# Patient Record
Sex: Female | Born: 1935
Health system: Southern US, Community
[De-identification: ages and names within clinical notes are randomized; demographics above are authoritative.]

## PROBLEM LIST (undated history)

## (undated) DIAGNOSIS — N289 Disorder of kidney and ureter, unspecified: Secondary | ICD-10-CM

## (undated) DIAGNOSIS — T8859XA Other complications of anesthesia, initial encounter: Secondary | ICD-10-CM

## (undated) DIAGNOSIS — C169 Malignant neoplasm of stomach, unspecified: Secondary | ICD-10-CM

## (undated) DIAGNOSIS — G893 Neoplasm related pain (acute) (chronic): Secondary | ICD-10-CM

## (undated) DIAGNOSIS — I82409 Acute embolism and thrombosis of unspecified deep veins of unspecified lower extremity: Secondary | ICD-10-CM

## (undated) DIAGNOSIS — K219 Gastro-esophageal reflux disease without esophagitis: Secondary | ICD-10-CM

## (undated) DIAGNOSIS — T4145XA Adverse effect of unspecified anesthetic, initial encounter: Secondary | ICD-10-CM

## (undated) DIAGNOSIS — I10 Essential (primary) hypertension: Secondary | ICD-10-CM

## (undated) DIAGNOSIS — C189 Malignant neoplasm of colon, unspecified: Secondary | ICD-10-CM

## (undated) DIAGNOSIS — I509 Heart failure, unspecified: Secondary | ICD-10-CM

## (undated) DIAGNOSIS — I2510 Atherosclerotic heart disease of native coronary artery without angina pectoris: Secondary | ICD-10-CM

## (undated) DIAGNOSIS — I2699 Other pulmonary embolism without acute cor pulmonale: Secondary | ICD-10-CM

## (undated) HISTORY — PX: ABDOMINAL HYSTERECTOMY: SHX81

## (undated) HISTORY — PX: COLON SURGERY: SHX602

## (undated) HISTORY — DX: Neoplasm related pain (acute) (chronic): G89.3

## (undated) HISTORY — PX: IVC FILTER PLACEMENT (ARMC HX): HXRAD1551

## (undated) HISTORY — DX: Malignant neoplasm of colon, unspecified: C18.9

---

## 1998-06-28 HISTORY — PX: JOINT REPLACEMENT: SHX530

## 2015-05-16 ENCOUNTER — Other Ambulatory Visit: Payer: Self-pay

## 2015-05-16 ENCOUNTER — Emergency Department: Payer: Medicaid Other

## 2015-05-16 ENCOUNTER — Encounter: Payer: Self-pay | Admitting: *Deleted

## 2015-05-16 ENCOUNTER — Emergency Department
Admission: EM | Admit: 2015-05-16 | Discharge: 2015-05-16 | Disposition: A | Discharge status: Home / Self Care | Payer: Medicaid Other | Attending: Emergency Medicine | Admitting: Emergency Medicine

## 2015-05-16 DIAGNOSIS — Z7982 Long term (current) use of aspirin: Secondary | ICD-10-CM | POA: Insufficient documentation

## 2015-05-16 DIAGNOSIS — C189 Malignant neoplasm of colon, unspecified: Secondary | ICD-10-CM | POA: Insufficient documentation

## 2015-05-16 DIAGNOSIS — Z79899 Other long term (current) drug therapy: Secondary | ICD-10-CM | POA: Insufficient documentation

## 2015-05-16 DIAGNOSIS — I10 Essential (primary) hypertension: Secondary | ICD-10-CM | POA: Diagnosis not present

## 2015-05-16 DIAGNOSIS — G8929 Other chronic pain: Secondary | ICD-10-CM | POA: Insufficient documentation

## 2015-05-16 DIAGNOSIS — R1033 Periumbilical pain: Secondary | ICD-10-CM | POA: Diagnosis not present

## 2015-05-16 DIAGNOSIS — R109 Unspecified abdominal pain: Secondary | ICD-10-CM | POA: Diagnosis not present

## 2015-05-16 HISTORY — DX: Acute embolism and thrombosis of unspecified deep veins of unspecified lower extremity: I82.409

## 2015-05-16 HISTORY — DX: Malignant neoplasm of stomach, unspecified: C16.9

## 2015-05-16 HISTORY — DX: Atherosclerotic heart disease of native coronary artery without angina pectoris: I25.10

## 2015-05-16 HISTORY — DX: Disorder of kidney and ureter, unspecified: N28.9

## 2015-05-16 HISTORY — DX: Gastro-esophageal reflux disease without esophagitis: K21.9

## 2015-05-16 HISTORY — DX: Essential (primary) hypertension: I10

## 2015-05-16 HISTORY — DX: Other pulmonary embolism without acute cor pulmonale: I26.99

## 2015-05-16 LAB — URINALYSIS COMPLETE WITH MICROSCOPIC (ARMC ONLY)
Bilirubin Urine: NEGATIVE
GLUCOSE, UA: NEGATIVE mg/dL
Hgb urine dipstick: NEGATIVE
Ketones, ur: NEGATIVE mg/dL
Leukocytes, UA: NEGATIVE
NITRITE: NEGATIVE
Protein, ur: NEGATIVE mg/dL
Specific Gravity, Urine: 1.006 (ref 1.005–1.030)
pH: 6 (ref 5.0–8.0)

## 2015-05-16 LAB — CBC WITH DIFFERENTIAL/PLATELET
BASOS PCT: 1 %
Basophils Absolute: 0.1 10*3/uL (ref 0–0.1)
EOS ABS: 0.1 10*3/uL (ref 0–0.7)
Eosinophils Relative: 1 %
HCT: 32 % — ABNORMAL LOW (ref 35.0–47.0)
Hemoglobin: 10.6 g/dL — ABNORMAL LOW (ref 12.0–16.0)
Lymphocytes Relative: 32 %
Lymphs Abs: 2.4 10*3/uL (ref 1.0–3.6)
MCH: 29.5 pg (ref 26.0–34.0)
MCHC: 33.2 g/dL (ref 32.0–36.0)
MCV: 88.8 fL (ref 80.0–100.0)
MONO ABS: 0.6 10*3/uL (ref 0.2–0.9)
MONOS PCT: 8 %
Neutro Abs: 4.3 10*3/uL (ref 1.4–6.5)
Neutrophils Relative %: 58 %
Platelets: 242 10*3/uL (ref 150–440)
RBC: 3.61 MIL/uL — ABNORMAL LOW (ref 3.80–5.20)
RDW: 16.9 % — AB (ref 11.5–14.5)
WBC: 7.5 10*3/uL (ref 3.6–11.0)

## 2015-05-16 LAB — LIPASE, BLOOD: Lipase: 51 U/L (ref 11–51)

## 2015-05-16 LAB — LACTIC ACID, PLASMA: LACTIC ACID, VENOUS: 1.3 mmol/L (ref 0.5–2.0)

## 2015-05-16 LAB — COMPREHENSIVE METABOLIC PANEL
ALBUMIN: 3.3 g/dL — AB (ref 3.5–5.0)
ALT: 7 U/L — ABNORMAL LOW (ref 14–54)
ANION GAP: 5 (ref 5–15)
AST: 14 U/L — ABNORMAL LOW (ref 15–41)
Alkaline Phosphatase: 61 U/L (ref 38–126)
BILIRUBIN TOTAL: 0.8 mg/dL (ref 0.3–1.2)
BUN: 21 mg/dL — ABNORMAL HIGH (ref 6–20)
CO2: 27 mmol/L (ref 22–32)
Calcium: 9 mg/dL (ref 8.9–10.3)
Chloride: 101 mmol/L (ref 101–111)
Creatinine, Ser: 1.01 mg/dL — ABNORMAL HIGH (ref 0.44–1.00)
GFR calc non Af Amer: 50 mL/min — ABNORMAL LOW (ref >60)
GFR, EST AFRICAN AMERICAN: 58 mL/min — AB (ref >60)
GLUCOSE: 93 mg/dL (ref 65–99)
POTASSIUM: 4.4 mmol/L (ref 3.5–5.1)
SODIUM: 133 mmol/L — AB (ref 135–145)
TOTAL PROTEIN: 6.7 g/dL (ref 6.5–8.1)

## 2015-05-16 MED ORDER — FENTANYL CITRATE (PF) 100 MCG/2ML IJ SOLN
50.0000 ug | Freq: Once | INTRAMUSCULAR | Status: AC
  Administered 2015-05-16: 50 ug via INTRAVENOUS
  Filled 2015-05-16: qty 2

## 2015-05-16 MED ORDER — OXYCODONE-ACETAMINOPHEN 5-325 MG PO TABS: 1.0000 | ORAL_TABLET | Freq: Four times a day (QID) | ORAL | Status: DC | PRN

## 2015-05-16 MED ORDER — IOHEXOL 240 MG/ML SOLN
25.0000 mL | Freq: Once | INTRAMUSCULAR | Status: AC | PRN
  Administered 2015-05-16: 25 mL via ORAL

## 2015-05-16 MED ORDER — IOHEXOL 300 MG/ML  SOLN
75.0000 mL | Freq: Once | INTRAMUSCULAR | Status: AC | PRN
  Administered 2015-05-16: 75 mL via INTRAVENOUS

## 2015-05-16 NOTE — Discharge Instructions (Signed)
Abdominal Pain, Adult It was a pleasure to meet you. It does appear that there is still is likely cancer in your abdomen and this can be causing some of your pain. If you have increased pain, vomiting, fever, or you feel worse in any way including lightheadedness or dehydration return to the emergency room. We are happy to take care of you. Take the pain medication as needed. Do not take tramadol and Percocet at the same time. Continue taking MiraLAX or something to encourage her bowels to move while you take the pain medication as they can constipate you. Do follow-up with the meeting you have already scheduled for changing her Medicare to [facility] and follow-up closely as an outpatient in the clinic and doctor that we have advised to.   Many things can cause abdominal pain. Usually, abdominal pain is not caused by a disease and will improve without treatment. It can often be observed and treated at home. Your health care provider will do a physical exam and possibly order blood tests and X-rays to help determine the seriousness of your pain. However, in many cases, more time must pass before a clear cause of the pain can be found. Before that point, your health care provider may not know if you need more testing or further treatment. HOME CARE INSTRUCTIONS Monitor your abdominal pain for any changes. The following actions may help to alleviate any discomfort you are experiencing:  Only take over-the-counter or prescription medicines as directed by your health care provider.  Do not take laxatives unless directed to do so by your health care provider.  Try a clear liquid diet (broth, tea, or water) as directed by your health care provider. Slowly move to a bland diet as tolerated. SEEK MEDICAL CARE IF:  You have unexplained abdominal pain.  You have abdominal pain associated with nausea or diarrhea.  You have pain when you urinate or have a bowel movement.  You experience abdominal pain  that wakes you in the night.  You have abdominal pain that is worsened or improved by eating food.  You have abdominal pain that is worsened with eating fatty foods.  You have a fever. SEEK IMMEDIATE MEDICAL CARE IF:  Your pain does not go away within 2 hours.  You keep throwing up (vomiting).  Your pain is felt only in portions of the abdomen, such as the right side or the left lower portion of the abdomen.  You pass bloody or black tarry stools. MAKE SURE YOU:  Understand these instructions.  Will watch your condition.  Will get help right away if you are not doing well or get worse.   This information is not intended to replace advice given to you by your health care provider. Make sure you discuss any questions you have with your health care provider.   Document Released: 03/24/2005 Document Revised: 03/05/2015 Document Reviewed: 02/21/2013 Elsevier Interactive Patient Education [employer].

## 2015-05-16 NOTE — ED Provider Notes (Addendum)
[facility] Emergency Department Provider Note  ____________________________________________   I have reviewed the triage vital signs and the nursing notes.   HISTORY  Chief Complaint Abdominal Pain and Colon Cancer    HPI Jill Bowers is a 81 y.o. female unfortunate 81 year old woman presents today complaining of chronic abdominal pain which is been there for several months. Patient just moved here from [place]. She had a very, complicated and extensive recent medical history there. The daughter moved her here to keep her closerto her. She fortunately does bring a history and physical with her from her recent hospitalizations. Patient is DNR/DNI. According to notes, patient has a history of surgical resection of colon cancer July 2016 with a very, complicated course. Apparently, according to her daughter, she had a reaction to the anesthesia was "in a coma" for a week. Afterwards, she develope a right lower showed a DVT bilateral pulmonary emboli. Which is anticoagulated, she had GI bleeds requiring transfusion. She had an IVC filter placed 02/05/2015. She has chronic A. fib but is not on anticoagulants due to the GI bleeding. She presented again to different hospital, [facility] in 02/12/2015 complaining of shortness of breath she had an acute kidney injury at that time and she was metabolically acidotic secondary to dehydration and antihypertensive use it was thought. They DC'd her ACE inhibitor and or ARB, they gave her IV fluid her creatinine was down to 1.6 at discharge. Patient has a history of anemia and her hemoglobin was 7.8. Patient was given a prescription for tramadol for her chronic abdominal pain which is been present since her surgery, and she has been taking it but she has breakthrough pain and umbilical pain and this is why the daughter brought her in. She is eating and drinking but not as much as she used to. No fevers no chills no vomiting.  She has had no melena or bright red blood per rectum or hematemesis. She takes an aspirin but is not otherwise anticoagulated. Last known hemoglobin was 7.8.  Past Medical History  Diagnosis Date  . Cancer ([place])   . Coronary artery disease   . Hypertension   . Blood clot associated with vein wall inflammation   . Anemia   . DVT (deep venous thrombosis) ([place])   . PE (pulmonary embolism)   . Hyperkalemia   . GERD (gastroesophageal reflux disease)   . Vitamin D deficiency   . Renal disorder   . Gastrointestinal hemorrhage   . Malignant neoplasm of stomach (HCC)     There are no active problems to display for this patient.   Past Surgical History  Procedure Laterality Date  . Ivc filter placement ([facility] hx)    . Colon surgery    . Abdominal hysterectomy    . Joint replacement Left     knee    Current Outpatient Rx  Name  Route  Sig  Dispense  Refill  . acetaminophen (TYLENOL) 650 MG CR tablet   Oral   Take 650 mg by mouth 3 (three) times daily with meals.         Marland Kitchen aspirin 81 MG chewable tablet   Oral   Chew 81 mg by mouth daily.         . carvedilol (COREG) 6.25 MG tablet   Oral   Take 6.25 mg by mouth 2 (two) times daily with a meal.         . Cholecalciferol (VITAMIN D-3) 1000 UNITS CAPS   Oral  Take 2 capsules by mouth daily.         . furosemide (LASIX) 40 MG tablet   Oral   Take 40 mg by mouth 2 (two) times daily.         . Magnesium Oxide 500 MG TABS   Oral   Take 1 tablet by mouth 2 (two) times daily.         Marland Kitchen omeprazole (PRILOSEC) 20 MG capsule   Oral   Take 20 mg by mouth daily.         Marland Kitchen spironolactone (ALDACTONE) 25 MG tablet   Oral   Take 25 mg by mouth 2 (two) times daily.         . temazepam (RESTORIL) 15 MG capsule   Oral   Take 15 mg by mouth at bedtime as needed for sleep.         . traMADol (ULTRAM) 50 MG tablet   Oral   Take by mouth every 6 (six) hours as needed.         . vitamin C (ASCORBIC ACID) 500 MG  tablet   Oral   Take 500 mg by mouth daily.           Allergies Nsaids; Reglan; Statins; and Torsemide  No family history on file.  Social History Social History  Substance Use Topics  . Smoking status: Never Smoker   . Smokeless tobacco: None  . Alcohol Use: No    Review of Systems Constitutional: No fever/chills Eyes: No visual changes. ENT: No sore throat. No stiff neck no neck pain Cardiovascular: Denies chest pain. Respiratory: Denies shortness of breath. Gastrointestinal:   no vomiting.  No diarrhea.  No constipation. Genitourinary: Negative for dysuria. Musculoskeletal: Negative lower extremity swelling Skin: Negative for rash. Neurological: Negative for headaches, focal weakness or numbness. 10-point ROS otherwise negative.  ____________________________________________   PHYSICAL EXAM:  VITAL SIGNS: ED Triage Vitals  Enc Vitals Group     BP 05/16/15 0946 96/41 mmHg     Pulse Rate 05/16/15 0946 56     Resp 05/16/15 0946 20     Temp 05/16/15 0946 98.2 F (36.8 C)     Temp Source 05/16/15 0946 Oral     SpO2 05/16/15 0946 99 %     Weight 05/16/15 0946 170 lb (77.111 kg)     Height 05/16/15 0946 5\' 3"  (1.6 m)     Head Cir --      Peak Flow --      Pain Score 05/16/15 0948 8     Pain Loc --      Pain Edu? --      Excl. in [place]? --     Constitutional: Alert and oriented. Well appearing and in no acute distress. Eyes: Conjunctivae are normal. PERRL. EOMI. Head: Atraumatic. Nose: No congestion/rhinnorhea. Mouth/Throat: Mucous membranes are moist.  Oropharynx non-erythematous. Neck: No stridor.   Nontender with no meningismus Cardiovascular: Normal rate, regular rhythm. Grossly normal heart sounds.  Good peripheral circulation. Respiratory: Normal respiratory effort.  No retractions. Lungs CTAB. Abdominal: Soft and late tender especially in the periumbilical region. No distention. No guarding no rebound Back:  There is no focal tenderness or step off  there is no midline tenderness there are no lesions noted. there is no CVA tenderness Musculoskeletal: No lower extremity tenderness. No joint effusions, no DVT signs strong distal pulses no edema Neurologic:  Normal speech and language. No gross focal neurologic deficits are appreciated.  Skin:  Skin is  warm, dry and intact. No rash noted. Psychiatric: Mood and affect are normal. Speech and behavior are normal.  ____________________________________________   LABS (all labs ordered are listed, but only abnormal results are displayed)  Labs Reviewed  CBC WITH DIFFERENTIAL/PLATELET - Abnormal; Notable for the following:    RBC 3.61 (*)    Hemoglobin 10.6 (*)    HCT 32.0 (*)    RDW 16.9 (*)    All other components within normal limits  COMPREHENSIVE METABOLIC PANEL - Abnormal; Notable for the following:    Sodium 133 (*)    BUN 21 (*)    Creatinine, Ser 1.01 (*)    Albumin 3.3 (*)    AST 14 (*)    ALT 7 (*)    GFR calc non Af Amer 50 (*)    GFR calc Af Amer 58 (*)    All other components within normal limits  URINALYSIS COMPLETEWITH MICROSCOPIC ([facility] ONLY) - Abnormal; Notable for the following:    Color, Urine YELLOW (*)    APPearance CLEAR (*)    Bacteria, UA RARE (*)    Squamous Epithelial / LPF 0-5 (*)    All other components within normal limits  LIPASE, BLOOD  LACTIC ACID, PLASMA  LACTIC ACID, PLASMA   ____________________________________________  EKG  I personally interpreted any EKGs ordered by me or triage Sinus bradycardia rate 59 bpm normal axis, no acute ST elevation, flipped T's noted laterally, no old for comparison. ____________________________________________  [id number]  I reviewed any imaging ordered by me or triage that were performed during my shift ____________________________________________   PROCEDURES  Procedure(s) performed: None  Critical Care performed: None  ____________________________________________   INITIAL IMPRESSION /  ASSESSMENT AND PLAN / ED COURSE  Pertinent labs & imaging results that were available during my care of the patient were reviewed by me and considered in my medical decision making (see chart for details).  Unfortunately woman with a very, complicated recent past medical history presents today with persistent abdominal pain which has been there for several months and worse over the last several weeks. She is DNR/DNI and likely would not survive another surgery. Given her age and comorbidities, patient certainly is not a candidate for another surgery as the last surgery take her for months to recover from. The family would not want that anyway. The patient is here for pain control chronic abdominal pain and also apparently has moved here with no intact plan for any outpatient follow-up. I did do an extensive workup on her here, there is no evidence of UTI there is no evidence of ongoing GI bleed, her hemoglobin is much better than it was at discharge, her white count is normal her electrolytes are reassuring her creatinine is reassuring she does appear perhaps mildly dehydrated may have given her some IV fluid. There is no evidence of ischemic gut and her lactic acid is normal. I do not think this daily chronic pain since her surgery represents preferred cardiac pain nor do I feel this represents pulmonary or intrathoracic pathology. There is no evidence of obstruction. CT scan does not show any acute pathology requiring intervention today, but there is some suggestion of possible metastatic disease. Patient will need outpatient follow-up.   ----------------------------------------- 1:52 PM on 05/16/2015 -----------------------------------------  He did have social work talk to the family they will get outpatient follow-up. Patient is asymmetric with her heart rate. She is asymmetric with her blood pressure. She and family state that she runs about this level. Given  recent extensive, complicated  hospitalizations and would prefer not to be hospitalized and at this time I see no compelling reason to do so however they do need outpatient follow-up. Social workers have them establish this. They need to change her Medicaid to the state they will do so. I have made them very well aware that this hospital's up and limited anytime if they need to come back and they are certainly welcome to the emergency room for any new or worsening symptoms. They are requesting narcotic pain medication to discharge and the tramadol I will give that to them given her cancer status. I have advised him not to take the tramadol with Percocet, and I have also advised them to continue taking the MiraLAX that she already does. The reason of their visit here was to have outpatient follow-up and increased pain control. The patient has no complaints at this time and is eager to go home, I think this is not unreasonable given her history and again they can come back and anytime ____________________________________________   FINAL CLINICAL IMPRESSION(S) / ED DIAGNOSES  Final diagnoses:  None     Schuyler Amor, MD 05/16/15 [place], MD 05/16/15 [id number]

## 2015-05-16 NOTE — ED Notes (Signed)
Pt has Stage 3 colon cancer, daughter recently moved pt to [place], pt reports lower abdominal pain

## 2015-05-19 ENCOUNTER — Encounter: Payer: Self-pay | Admitting: *Deleted

## 2015-05-19 ENCOUNTER — Inpatient Hospital Stay: Payer: Medicaid Other | Attending: Internal Medicine | Admitting: Internal Medicine

## 2015-05-19 ENCOUNTER — Telehealth: Payer: Self-pay | Admitting: *Deleted

## 2015-05-19 VITALS — BP 121/64 | HR 60 | Temp 98.6°F | Wt 165.1 lb

## 2015-05-19 DIAGNOSIS — C189 Malignant neoplasm of colon, unspecified: Secondary | ICD-10-CM | POA: Diagnosis not present

## 2015-05-19 DIAGNOSIS — R1030 Lower abdominal pain, unspecified: Secondary | ICD-10-CM | POA: Insufficient documentation

## 2015-05-19 DIAGNOSIS — R42 Dizziness and giddiness: Secondary | ICD-10-CM | POA: Diagnosis not present

## 2015-05-19 DIAGNOSIS — I10 Essential (primary) hypertension: Secondary | ICD-10-CM

## 2015-05-19 DIAGNOSIS — Z79899 Other long term (current) drug therapy: Secondary | ICD-10-CM | POA: Diagnosis not present

## 2015-05-19 DIAGNOSIS — D649 Anemia, unspecified: Secondary | ICD-10-CM | POA: Insufficient documentation

## 2015-05-19 DIAGNOSIS — Z86711 Personal history of pulmonary embolism: Secondary | ICD-10-CM

## 2015-05-19 DIAGNOSIS — K5730 Diverticulosis of large intestine without perforation or abscess without bleeding: Secondary | ICD-10-CM | POA: Insufficient documentation

## 2015-05-19 DIAGNOSIS — K7689 Other specified diseases of liver: Secondary | ICD-10-CM | POA: Diagnosis not present

## 2015-05-19 DIAGNOSIS — Z8719 Personal history of other diseases of the digestive system: Secondary | ICD-10-CM | POA: Diagnosis not present

## 2015-05-19 DIAGNOSIS — Z7982 Long term (current) use of aspirin: Secondary | ICD-10-CM | POA: Insufficient documentation

## 2015-05-19 DIAGNOSIS — I700 Atherosclerosis of aorta: Secondary | ICD-10-CM | POA: Diagnosis not present

## 2015-05-19 DIAGNOSIS — G8929 Other chronic pain: Secondary | ICD-10-CM | POA: Diagnosis not present

## 2015-05-19 DIAGNOSIS — Z86718 Personal history of other venous thrombosis and embolism: Secondary | ICD-10-CM | POA: Insufficient documentation

## 2015-05-19 DIAGNOSIS — K219 Gastro-esophageal reflux disease without esophagitis: Secondary | ICD-10-CM | POA: Insufficient documentation

## 2015-05-19 DIAGNOSIS — E559 Vitamin D deficiency, unspecified: Secondary | ICD-10-CM | POA: Insufficient documentation

## 2015-05-19 MED ORDER — MECLIZINE HCL 32 MG PO TABS: 32.0000 mg | ORAL_TABLET | Freq: Three times a day (TID) | ORAL | Status: DC | PRN

## 2015-05-19 MED ORDER — METRONIDAZOLE 500 MG PO TABS: 500.0000 mg | ORAL_TABLET | Freq: Three times a day (TID) | ORAL | Status: DC

## 2015-05-19 MED ORDER — OXYCODONE-ACETAMINOPHEN 5-325 MG PO TABS: 1.0000 | ORAL_TABLET | Freq: Two times a day (BID) | ORAL | Status: DC | PRN

## 2015-05-19 MED ORDER — CIPROFLOXACIN HCL 500 MG PO TABS: 500.0000 mg | ORAL_TABLET | Freq: Two times a day (BID) | ORAL | Status: DC

## 2015-05-19 MED ORDER — MECLIZINE HCL 25 MG PO TABS: 25.0000 mg | ORAL_TABLET | Freq: Three times a day (TID) | ORAL | Status: DC | PRN

## 2015-05-19 NOTE — Progress Notes (Signed)
Patient here today as new evaluation to Dr. Rogue Bussing.  Following up from ED visit this past Friday for abdominal pain and malignant neoplasm of colon (unspecified part of colon). Offers no complaints at this time.  Has pain earlier today but took Percocet.  Patient is accompanied by daughter for this visit and she states patient has a lot of abdominal pain as well as headaches.

## 2015-05-19 NOTE — Progress Notes (Signed)
[facility] CONSULT NOTE  Patient Care Team: No Pcp Per Patient as PCP - General (General Practice)  CHIEF COMPLAINTS/PURPOSE OF CONSULTATION:   # July 2016- COLON CANCER [s/p surgery; [place]]; STAGE III [as per family];NOv 2016- CT A/P [[facility]]- subcentimeter hepatic cysts/question metastatic disease; irregular soft tissue density in the peritoneal fat within pelvis ? Scarring versus recurrence. ? Diverticulitis/diverticulosis.  # July R LE DVT/Bil PE s/p IVC filter;  Hx of GIB; AKI; Hx of Severe Anemia  HISTORY OF PRESENTING ILLNESS:  Jill Bowers 81 y.o.  female with a history of colon cancer diagnosed in July 2016; and as per the family/records available- patient had a complicated course with DVT PE; while on anticoagulations that up in the GI bleed. Patient status post IVC filter. However in August 2016 she developed acute renal injury/question related to ACE inhibitor versus others. Anemia hemoglobin of 10.6; most recent in sup 2016.  I also reviewed the records available from the emergency room dated 05/16/2015; summarized as above. Patient is a fair historian/ also the history is taken talking to the patient's daughter.  Patient complains of abdominal pain since her surgery; worsens in the last few weeks; to a point that she is woken up with pain. Denies any constipation or diarrhea. No fever no chills. Mild-to-moderate fatigue. No blood in stools black stools.  CT scan done in the emergency room/summarized above- question diverticulitis.  ROS: A complete 10 point review of system is done which is negative except mentioned above in history of present illness  MEDICAL HISTORY:  Past Medical History  Diagnosis Date  . Colon cancer ([place])   . Coronary artery disease   . Hypertension   . Blood clot associated with vein wall inflammation   . Anemia   . DVT (deep venous thrombosis) ([place])   . PE (pulmonary embolism)   . Hyperkalemia   . GERD (gastroesophageal reflux disease)    . Vitamin D deficiency   . Renal disorder   . Gastrointestinal hemorrhage   . Malignant neoplasm of stomach ([place])     SURGICAL HISTORY: Past Surgical History  Procedure Laterality Date  . Ivc filter placement ([facility] hx)    . Colon surgery    . Abdominal hysterectomy    . Joint replacement Left     knee    SOCIAL HISTORY: Patient used to live alone in [place] prior to surgery in July 2016. She has been through multiple hospitalizations/rehabilitation. Currently moved to [place] to the daughter. Social History   Social History  . Marital Status: Widowed    Spouse Name: N/A  . Number of Children: N/A  . Years of Education: N/A   Occupational History  . Not on file.   Social History Main Topics  . Smoking status: Never Smoker   . Smokeless tobacco: Not on file  . Alcohol Use: No  . Drug Use: Not on file  . Sexual Activity: Not on file   Other Topics Concern  . Not on file   Social History Narrative    FAMILY HISTORY:  sister- colon cancer; son had colon cancer.   ALLERGIES:  is allergic to nsaids; reglan; statins; and torsemide.  MEDICATIONS:  Current Outpatient Prescriptions  Medication Sig Dispense Refill  . acetaminophen (TYLENOL) 650 MG CR tablet Take 650 mg by mouth 3 (three) times daily with meals.    Marland Kitchen aspirin 81 MG chewable tablet Chew 81 mg by mouth daily.    . carvedilol (COREG) 6.25 MG  tablet Take 6.25 mg by mouth 2 (two) times daily with a meal.    . Cholecalciferol (VITAMIN D-3) 1000 UNITS CAPS Take 2 capsules by mouth daily.    . furosemide (LASIX) 40 MG tablet Take 40 mg by mouth 2 (two) times daily.    . Magnesium Oxide 500 MG TABS Take 1 tablet by mouth 2 (two) times daily.    Marland Kitchen omeprazole (PRILOSEC) 20 MG capsule Take 20 mg by mouth daily.    Marland Kitchen oxyCODONE-acetaminophen (ROXICET) 5-325 MG tablet Take 1 tablet by mouth every 6 (six) hours as needed. 20 tablet 0  . spironolactone (ALDACTONE) 25 MG tablet Take 25 mg by mouth 2 (two)  times daily.    . temazepam (RESTORIL) 15 MG capsule Take 15 mg by mouth at bedtime as needed for sleep.    . traMADol (ULTRAM) 50 MG tablet Take by mouth every 6 (six) hours as needed.    . vitamin C (ASCORBIC ACID) 500 MG tablet Take 500 mg by mouth daily.     No current facility-administered medications for this visit.      Marland Kitchen  PHYSICAL EXAMINATION: ECOG PERFORMANCE STATUS: 3 - Symptomatic, >50% confined to bed  Filed Vitals:   05/19/15 1531  BP: 121/64  Pulse: 60  Temp: 98.6 F (37 C)   Filed Weights   05/19/15 1531  Weight: 165 lb 2 oz (74.9 kg)    GENERAL: Well-nourished well-developed; Alert, no distress and comfortable.  In wheel chair.  EYES: no pallor or icterus OROPHARYNX: no thrush or ulceration; good dentition  NECK: supple, no masses felt LYMPH:  no palpable lymphadenopathy in the cervical, axillary or inguinal regions LUNGS: clear to auscultation and  No wheeze or crackles HEART/CVS: regular rate & rhythm and no murmurs; No lower extremity edema ABDOMEN: abdomen soft, non-tender and normal bowel sounds Musculoskeletal:no cyanosis of digits and no clubbing  PSYCH: alert & oriented x 3 with fluent speech NEURO: no focal motor/sensory deficits SKIN:  no rashes or significant lesions  LABORATORY DATA:  I have reviewed the data as listed Lab Results  Component Value Date   WBC 7.5 05/16/2015   HGB 10.6* 05/16/2015   HCT 32.0* 05/16/2015   MCV 88.8 05/16/2015   PLT 242 05/16/2015    Recent Labs  05/16/15 1039  NA 133*  K 4.4  CL 101  CO2 27  GLUCOSE 93  BUN 21*  CREATININE 1.01*  CALCIUM 9.0  GFRNONAA 50*  GFRAA 58*  PROT 6.7  ALBUMIN 3.3*  AST 14*  ALT 7*  ALKPHOS 61  BILITOT 0.8    RADIOGRAPHIC STUDIES: I have personally reviewed the radiological images as listed and agreed with the findings in the report. Ct Abdomen Pelvis W Contrast  05/16/2015  CLINICAL DATA:  Lower abdominal pain, stage III colon cancer. EXAM: CT ABDOMEN AND  PELVIS WITH CONTRAST TECHNIQUE: Multidetector CT imaging of the abdomen and pelvis was performed using the standard protocol following bolus administration of intravenous contrast. CONTRAST:  21mL OMNIPAQUE IOHEXOL 300 MG/ML  SOLN COMPARISON:  None. FINDINGS: Probable scarring or subsegmental atelectasis is noted posteriorly in right lung base. No significant osseous abnormality is noted. No gallstones are noted. Multiple cysts of varying sizes are seen seen in hepatic parenchyma. Multiple other smaller low densities are noted that are too small to characterize, and metastatic disease cannot be excluded given the history of colon cancer. The spleen appears normal. Pancreatic ductal dilatation is noted measuring 5 mm in the tail. Adrenal glands  appear normal. No hydronephrosis or renal obstruction is noted. No renal or ureteral calculi are noted. Atherosclerosis of abdominal aorta is noted without aneurysm formation. IVC filter is noted in infrarenal position. There is no evidence of bowel obstruction. Extensive diverticulosis of sigmoid colon is noted, with probable diverticulitis seen proximally. Uterine calcifications are noted most consistent with fibroids. Urinary bladder is unremarkable. No significant adenopathy is noted. Irregular density is seen in the peritoneal fat anteriorly in the pelvis ; carcinomatosis cannot be excluded. IMPRESSION: Multiple cysts are noted throughout hepatic parenchyma, with multiple other smaller densities which are too small to characterize ; given the history of colon carcinoma, MRI of the liver is recommended on nonemergent basis to evaluate for metastatic disease. Pancreatic duct dilatation is measured at 5 mm in the tail ; no definite pancreatic mass is noted, but clinical correlation is recommended to rule out pancreatic ductal obstruction. Atherosclerosis of abdominal aorta is noted without aneurysm formation. Extensive diverticulosis of sigmoid colon is noted, with probable  acute diverticulitis seen proximally within the sigmoid colon. Irregular soft tissue densities are seen in the peritoneal fat anteriorly within the pelvis which may simply represent scarring, but peritoneal carcinomatosis cannot be excluded. Electronically Signed   By: Marijo Conception, M.D.   On: 05/16/2015 13:10    ASSESSMENT & PLAN:   # Performance status 3. Stage III colon cancer [as per family]- we will try to obtain the records from previous office. I discussed that in general we would recommend adjuvant chemotherapy for stage III colon cancer. However given her age/multiple medical issues this is prohibitive; and the risks of chemotherapy over weigh the benefits. In terms of prognosis/risk of recurrence- would be discussed once I'm able to review the her pathology report.  # Abdominal pain-chronic question related to his surgery- recommend continue Percocet. However given the recent worsening- I would recommend a trial of antibiotics for possible diverticulitis.  # Multiple hepatic hypodensities-cyst versus other etiology- I would not recommend any biopsy at this time. I think given her borderline performance status- this would not change the current management.  # Vertigo- recommend meclizine.  # We'll see her back in approximately 2 weeks or so; to evaluate her abdominal pain/get CBC CMP CEA at that time.   All questions were answered. The patient knows to call the clinic with any problems, questions or concerns.     Cammie Sickle, MD 05/19/2015 3:59 PM

## 2015-05-19 NOTE — Telephone Encounter (Signed)
Meclizine not available in 32 mg can they change to 25 mg? Per PO Dr Rogue Bussing ok to change to 25 mg tid prn

## 2015-05-21 ENCOUNTER — Telehealth: Payer: Self-pay | Admitting: *Deleted

## 2015-05-21 NOTE — Telephone Encounter (Signed)
Left message that pt was here earlier in the week and had asked for refill and rx was never given to pt. She can come by anytime today and pick it up.  Asked to please call and verify they are coming. And that we do apologize for not giving it to them the day they were here.

## 2015-05-27 ENCOUNTER — Telehealth: Payer: Self-pay | Admitting: *Deleted

## 2015-05-27 NOTE — Telephone Encounter (Signed)
md agrees

## 2015-05-27 NOTE — Telephone Encounter (Signed)
When asked how she is giving her pain med, she reports that she only gives her 1 pill once a day and sometimes twice a day. I asked her to give med twice a day to see if her pain would be better controlled before we decide to change her to something else. She said she will try that and let us know if it helps

## 2015-05-27 NOTE — Telephone Encounter (Signed)
Came in office to pick up rx from 05/19/15 and reported to receptionist that the patient is crying with pain all the tome and stating that she needs something stronger for pain. She left office and requested that you call her to discuss pain med

## 2015-05-29 ENCOUNTER — Encounter: Payer: Self-pay | Admitting: Radiology

## 2015-05-29 ENCOUNTER — Emergency Department (HOSPITAL_BASED_OUTPATIENT_CLINIC_OR_DEPARTMENT_OTHER)
Admit: 2015-05-29 | Discharge: 2015-05-29 | Disposition: A | Discharge status: Home / Self Care | Payer: Medicare Other | Attending: Emergency Medicine | Admitting: Emergency Medicine

## 2015-05-29 ENCOUNTER — Observation Stay
Admission: EM | Admit: 2015-05-29 | Discharge: 2015-05-30 | Disposition: A | Discharge status: Home / Self Care | Payer: Medicare Other | Attending: Internal Medicine | Admitting: Internal Medicine

## 2015-05-29 ENCOUNTER — Emergency Department: Payer: Medicare Other

## 2015-05-29 DIAGNOSIS — R0602 Shortness of breath: Secondary | ICD-10-CM | POA: Insufficient documentation

## 2015-05-29 DIAGNOSIS — R0600 Dyspnea, unspecified: Secondary | ICD-10-CM

## 2015-05-29 DIAGNOSIS — E559 Vitamin D deficiency, unspecified: Secondary | ICD-10-CM | POA: Diagnosis not present

## 2015-05-29 DIAGNOSIS — K5730 Diverticulosis of large intestine without perforation or abscess without bleeding: Secondary | ICD-10-CM | POA: Insufficient documentation

## 2015-05-29 DIAGNOSIS — R0789 Other chest pain: Secondary | ICD-10-CM | POA: Diagnosis not present

## 2015-05-29 DIAGNOSIS — I313 Pericardial effusion (noninflammatory): Secondary | ICD-10-CM

## 2015-05-29 DIAGNOSIS — K7689 Other specified diseases of liver: Secondary | ICD-10-CM | POA: Diagnosis not present

## 2015-05-29 DIAGNOSIS — I252 Old myocardial infarction: Secondary | ICD-10-CM | POA: Insufficient documentation

## 2015-05-29 DIAGNOSIS — Z96652 Presence of left artificial knee joint: Secondary | ICD-10-CM | POA: Diagnosis not present

## 2015-05-29 DIAGNOSIS — K8689 Other specified diseases of pancreas: Secondary | ICD-10-CM | POA: Diagnosis not present

## 2015-05-29 DIAGNOSIS — R079 Chest pain, unspecified: Principal | ICD-10-CM | POA: Insufficient documentation

## 2015-05-29 DIAGNOSIS — Z79899 Other long term (current) drug therapy: Secondary | ICD-10-CM | POA: Insufficient documentation

## 2015-05-29 DIAGNOSIS — R938 Abnormal findings on diagnostic imaging of other specified body structures: Secondary | ICD-10-CM | POA: Diagnosis not present

## 2015-05-29 DIAGNOSIS — R112 Nausea with vomiting, unspecified: Secondary | ICD-10-CM | POA: Insufficient documentation

## 2015-05-29 DIAGNOSIS — C189 Malignant neoplasm of colon, unspecified: Secondary | ICD-10-CM | POA: Insufficient documentation

## 2015-05-29 DIAGNOSIS — R0609 Other forms of dyspnea: Secondary | ICD-10-CM | POA: Insufficient documentation

## 2015-05-29 DIAGNOSIS — Z85028 Personal history of other malignant neoplasm of stomach: Secondary | ICD-10-CM | POA: Insufficient documentation

## 2015-05-29 DIAGNOSIS — K4090 Unilateral inguinal hernia, without obstruction or gangrene, not specified as recurrent: Secondary | ICD-10-CM | POA: Diagnosis not present

## 2015-05-29 DIAGNOSIS — J9811 Atelectasis: Secondary | ICD-10-CM | POA: Insufficient documentation

## 2015-05-29 DIAGNOSIS — D259 Leiomyoma of uterus, unspecified: Secondary | ICD-10-CM | POA: Insufficient documentation

## 2015-05-29 DIAGNOSIS — Z888 Allergy status to other drugs, medicaments and biological substances status: Secondary | ICD-10-CM | POA: Diagnosis not present

## 2015-05-29 DIAGNOSIS — F0390 Unspecified dementia without behavioral disturbance: Secondary | ICD-10-CM | POA: Diagnosis not present

## 2015-05-29 DIAGNOSIS — I110 Hypertensive heart disease with heart failure: Secondary | ICD-10-CM | POA: Diagnosis not present

## 2015-05-29 DIAGNOSIS — I2510 Atherosclerotic heart disease of native coronary artery without angina pectoris: Secondary | ICD-10-CM | POA: Diagnosis not present

## 2015-05-29 DIAGNOSIS — Z886 Allergy status to analgesic agent status: Secondary | ICD-10-CM | POA: Insufficient documentation

## 2015-05-29 DIAGNOSIS — Z90710 Acquired absence of both cervix and uterus: Secondary | ICD-10-CM | POA: Insufficient documentation

## 2015-05-29 DIAGNOSIS — Z8711 Personal history of peptic ulcer disease: Secondary | ICD-10-CM | POA: Insufficient documentation

## 2015-05-29 DIAGNOSIS — R1013 Epigastric pain: Secondary | ICD-10-CM | POA: Diagnosis not present

## 2015-05-29 DIAGNOSIS — Z86718 Personal history of other venous thrombosis and embolism: Secondary | ICD-10-CM | POA: Insufficient documentation

## 2015-05-29 DIAGNOSIS — K2900 Acute gastritis without bleeding: Secondary | ICD-10-CM | POA: Diagnosis not present

## 2015-05-29 DIAGNOSIS — Z66 Do not resuscitate: Secondary | ICD-10-CM | POA: Diagnosis not present

## 2015-05-29 DIAGNOSIS — K219 Gastro-esophageal reflux disease without esophagitis: Secondary | ICD-10-CM | POA: Insufficient documentation

## 2015-05-29 DIAGNOSIS — Z7982 Long term (current) use of aspirin: Secondary | ICD-10-CM | POA: Diagnosis not present

## 2015-05-29 DIAGNOSIS — I319 Disease of pericardium, unspecified: Secondary | ICD-10-CM | POA: Diagnosis present

## 2015-05-29 DIAGNOSIS — I3139 Other pericardial effusion (noninflammatory): Secondary | ICD-10-CM

## 2015-05-29 HISTORY — DX: Heart failure, unspecified: I50.9

## 2015-05-29 HISTORY — DX: Disorder of kidney and ureter, unspecified: N28.9

## 2015-05-29 LAB — CBC WITH DIFFERENTIAL/PLATELET
BASOS ABS: 0.1 10*3/uL (ref 0–0.1)
Basophils Relative: 1 %
Eosinophils Absolute: 0.1 10*3/uL (ref 0–0.7)
Eosinophils Relative: 1 %
HEMATOCRIT: 32.7 % — AB (ref 35.0–47.0)
Hemoglobin: 10.9 g/dL — ABNORMAL LOW (ref 12.0–16.0)
LYMPHS PCT: 36 %
Lymphs Abs: 2.5 10*3/uL (ref 1.0–3.6)
MCH: 29.7 pg (ref 26.0–34.0)
MCHC: 33.2 g/dL (ref 32.0–36.0)
MCV: 89.4 fL (ref 80.0–100.0)
MONO ABS: 0.5 10*3/uL (ref 0.2–0.9)
Monocytes Relative: 7 %
NEUTROS ABS: 3.9 10*3/uL (ref 1.4–6.5)
Neutrophils Relative %: 55 %
Platelets: 281 10*3/uL (ref 150–440)
RBC: 3.65 MIL/uL — AB (ref 3.80–5.20)
RDW: 16.6 % — AB (ref 11.5–14.5)
WBC: 7.1 10*3/uL (ref 3.6–11.0)

## 2015-05-29 LAB — COMPREHENSIVE METABOLIC PANEL
ALT: 6 U/L — AB (ref 14–54)
AST: 13 U/L — AB (ref 15–41)
Albumin: 3.4 g/dL — ABNORMAL LOW (ref 3.5–5.0)
Alkaline Phosphatase: 57 U/L (ref 38–126)
Anion gap: 9 (ref 5–15)
BILIRUBIN TOTAL: 0.2 mg/dL — AB (ref 0.3–1.2)
BUN: 20 mg/dL (ref 6–20)
CALCIUM: 9.4 mg/dL (ref 8.9–10.3)
CO2: 25 mmol/L (ref 22–32)
CREATININE: 1.11 mg/dL — AB (ref 0.44–1.00)
Chloride: 103 mmol/L (ref 101–111)
GFR calc Af Amer: 51 mL/min — ABNORMAL LOW (ref >60)
GFR, EST NON AFRICAN AMERICAN: 44 mL/min — AB (ref >60)
Glucose, Bld: 93 mg/dL (ref 65–99)
Potassium: 4.2 mmol/L (ref 3.5–5.1)
Sodium: 137 mmol/L (ref 135–145)
TOTAL PROTEIN: 6.8 g/dL (ref 6.5–8.1)

## 2015-05-29 LAB — BRAIN NATRIURETIC PEPTIDE: B Natriuretic Peptide: 139 pg/mL — ABNORMAL HIGH (ref 0.0–100.0)

## 2015-05-29 LAB — LACTIC ACID, PLASMA: Lactic Acid, Venous: 1.4 mmol/L (ref 0.5–2.0)

## 2015-05-29 LAB — TROPONIN I

## 2015-05-29 MED ORDER — VITAMIN D 1000 UNITS PO TABS
2000.0000 [IU] | ORAL_TABLET | Freq: Every day | ORAL | Status: DC
  Administered 2015-05-29 – 2015-05-30 (×2): 2000 [IU] via ORAL
  Filled 2015-05-29 (×2): qty 2

## 2015-05-29 MED ORDER — MORPHINE SULFATE (PF) 4 MG/ML IV SOLN
4.0000 mg | Freq: Once | INTRAVENOUS | Status: AC
  Administered 2015-05-29: 4 mg via INTRAVENOUS
  Filled 2015-05-29: qty 1

## 2015-05-29 MED ORDER — MAGNESIUM OXIDE 400 (241.3 MG) MG PO TABS
400.0000 mg | ORAL_TABLET | Freq: Two times a day (BID) | ORAL | Status: DC
  Administered 2015-05-29 – 2015-05-30 (×2): 400 mg via ORAL
  Filled 2015-05-29 (×4): qty 1

## 2015-05-29 MED ORDER — FUROSEMIDE 40 MG PO TABS
40.0000 mg | ORAL_TABLET | Freq: Two times a day (BID) | ORAL | Status: DC
  Administered 2015-05-30: 40 mg via ORAL
  Filled 2015-05-29 (×3): qty 1

## 2015-05-29 MED ORDER — ACETAMINOPHEN 325 MG PO TABS
650.0000 mg | ORAL_TABLET | Freq: Three times a day (TID) | ORAL | Status: DC
  Administered 2015-05-30 (×2): 650 mg via ORAL
  Filled 2015-05-29 (×2): qty 2

## 2015-05-29 MED ORDER — TEMAZEPAM 15 MG PO CAPS: 15.0000 mg | ORAL_CAPSULE | Freq: Every evening | ORAL | Status: DC | PRN

## 2015-05-29 MED ORDER — SODIUM CHLORIDE 0.9 % IV BOLUS (SEPSIS)
1000.0000 mL | Freq: Once | INTRAVENOUS | Status: AC
  Administered 2015-05-29: 1000 mL via INTRAVENOUS

## 2015-05-29 MED ORDER — ONDANSETRON HCL 4 MG PO TABS: 4.0000 mg | ORAL_TABLET | Freq: Four times a day (QID) | ORAL | Status: DC | PRN

## 2015-05-29 MED ORDER — HYDROMORPHONE HCL 1 MG/ML IJ SOLN
1.0000 mg | Freq: Once | INTRAMUSCULAR | Status: AC
  Administered 2015-05-29: 1 mg via INTRAVENOUS
  Filled 2015-05-29: qty 1

## 2015-05-29 MED ORDER — MECLIZINE HCL 25 MG PO TABS: 25.0000 mg | ORAL_TABLET | Freq: Three times a day (TID) | ORAL | Status: DC | PRN

## 2015-05-29 MED ORDER — OXYCODONE-ACETAMINOPHEN 5-325 MG PO TABS: 1.0000 | ORAL_TABLET | Freq: Two times a day (BID) | ORAL | Status: DC | PRN

## 2015-05-29 MED ORDER — DOXYCYCLINE HYCLATE 100 MG PO TABS
50.0000 mg | ORAL_TABLET | Freq: Two times a day (BID) | ORAL | Status: DC
  Administered 2015-05-29 – 2015-05-30 (×2): 50 mg via ORAL
  Filled 2015-05-29: qty 1
  Filled 2015-05-29: qty 2

## 2015-05-29 MED ORDER — PANTOPRAZOLE SODIUM 40 MG IV SOLR
40.0000 mg | Freq: Two times a day (BID) | INTRAVENOUS | Status: DC
  Administered 2015-05-29 – 2015-05-30 (×2): 40 mg via INTRAVENOUS
  Filled 2015-05-29 (×2): qty 40

## 2015-05-29 MED ORDER — IOHEXOL 350 MG/ML SOLN
85.0000 mL | Freq: Once | INTRAVENOUS | Status: AC | PRN
Start: 2015-05-29 — End: 2015-05-29
  Administered 2015-05-29: 85 mL via INTRAVENOUS

## 2015-05-29 MED ORDER — VITAMIN C 500 MG PO TABS
500.0000 mg | ORAL_TABLET | Freq: Every day | ORAL | Status: DC
  Administered 2015-05-29 – 2015-05-30 (×2): 500 mg via ORAL
  Filled 2015-05-29 (×2): qty 1

## 2015-05-29 MED ORDER — IOHEXOL 240 MG/ML SOLN
25.0000 mL | Freq: Once | INTRAMUSCULAR | Status: AC | PRN
  Administered 2015-05-29: 25 mL via ORAL

## 2015-05-29 MED ORDER — SPIRONOLACTONE 25 MG PO TABS
25.0000 mg | ORAL_TABLET | Freq: Two times a day (BID) | ORAL | Status: DC
  Administered 2015-05-29 – 2015-05-30 (×2): 25 mg via ORAL
  Filled 2015-05-29 (×2): qty 1

## 2015-05-29 MED ORDER — ASPIRIN EC 81 MG PO TBEC
81.0000 mg | DELAYED_RELEASE_TABLET | Freq: Every day | ORAL | Status: DC
  Administered 2015-05-30: 81 mg via ORAL
  Filled 2015-05-29: qty 1

## 2015-05-29 MED ORDER — ASPIRIN 81 MG PO CHEW
CHEWABLE_TABLET | ORAL | Status: AC
  Administered 2015-05-29: 81 mg
  Filled 2015-05-29: qty 1

## 2015-05-29 MED ORDER — ONDANSETRON HCL 4 MG/2ML IJ SOLN: 4.0000 mg | Freq: Four times a day (QID) | INTRAMUSCULAR | Status: DC | PRN

## 2015-05-29 MED ORDER — CARVEDILOL 6.25 MG PO TABS
6.2500 mg | ORAL_TABLET | Freq: Two times a day (BID) | ORAL | Status: DC
  Administered 2015-05-29 – 2015-05-30 (×2): 6.25 mg via ORAL
  Filled 2015-05-29 (×2): qty 1

## 2015-05-29 NOTE — Progress Notes (Signed)
Per report from ED, pt had refused PO lasix. When pt transferred to the floor her BP was 90/50's. Dr Lavetta Nielsen notified and the decision was made to hold the lasix due to her low BP. Will continue to monitor.

## 2015-05-29 NOTE — H&P (Signed)
[facility] [place] [facility] [place] NAME: Jill Bowers    MR#:  [id number]  DATE OF BIRTH:  August 24, 1928  DATE OF ADMISSION:  05/29/2015  PRIMARY CARE PHYSICIAN: No PCP Per Patient   REQUESTING/REFERRING PHYSICIAN: Dr. Thomasene Lot  CHIEF COMPLAINT: Nausea, vomiting and midepigastric pain, chest pain    Chief Complaint  Patient presents with  . Chest Pain    HISTORY OF PRESENT ILLNESS:  Jill Bowers  is a 81 y.o. female with a known history of hypertension, stage III colon cancer, PE, DVT comes in with epigastric pain with multiple episodes of nausea and vomiting today. Patient also had chest pain at one point. But patient main complaint is mid epigastric pain not radiating and type associated with multiple episodes of nausea and vomiting. No shortness of breath. Had the episode of chest discomfort in the midsternal which is resolved .  Has  dementia. The history obtained from the daughter. . 'll moved from [place] 3 weeks ago. Had a colon cancer surgery in July in [place], 18 inches of colon was removed. Postoperatively the patient had a PE, DVT unable to tolerate the blood thinners secondary to GI bleed so patient had IVC filter. Patient currently has no PCP here follows up with Dr. Charlaine Dalton from oncology.   PAST M midepigastric pain. Dr. Lyndel Safe HISTORY:   Past Medical History  Diagnosis Date  . Colon cancer ([place])   . Coronary artery disease   . Hypertension   . Blood clot associated with vein wall inflammation   . Anemia   . DVT (deep venous thrombosis) ([place])   . PE (pulmonary embolism)   . Hyperkalemia   . GERD (gastroesophageal reflux disease)   . Vitamin D deficiency   . Renal disorder   . Gastrointestinal hemorrhage   . Malignant neoplasm of stomach ([place])   . CHF (congestive heart failure) ([place])   . Renal insufficiency     PAST SURGICAL HISTOIRY:   Past Surgical History  Procedure Laterality Date  . Ivc  filter placement ([facility] hx)    . Colon surgery    . Abdominal hysterectomy    . Joint replacement Left     knee    SOCIAL HISTORY:   Social History  Substance Use Topics  . Smoking status: Never Smoker   . Smokeless tobacco: Not on file  . Alcohol Use: No    FAMILY HISTORY:  No family history on file.  DRUG ALLERGIES:   Allergies  Allergen Reactions  . Nsaids Other (See Comments)  . Reglan [Metoclopramide] Other (See Comments)  . Statins Other (See Comments)  . Torsemide Other (See Comments)    REVIEW OF SYSTEMS:  CONSTITUTIONAL: No fever, fatigue or weakness.  EYES: No blurred or double vision.  EARS, NOSE, AND THROAT: No tinnitus or ear pain.  RESPIRATORY: No cough, shortness of breath, wheezing or hemoptysis.  CARDIOVASCULAR: Transient midsternal chest discomfort which is resolved at this time. GASTROINTESTINAL:  nausea, abdominal pain today. No diarrhea.  GENITOURINARY: No dysuria, hematuria.  ENDOCRINE: No polyuria, nocturia,  HEMATOLOGY: No anemia, easy bruising or bleeding SKIN: No rash or lesion. MUSCULOSKELETAL: No joint pain or arthritis.   NEUROLOGIC: No tingling, numbness, weakness.  PSYCHIATRY: No anxiety or depression.   MEDICATIONS AT HOME:   Prior to Admission medications   Medication Sig Start Date End Date Taking? Authorizing Provider  acetaminophen (TYLENOL) 650 MG CR tablet Take 650 mg by mouth 3 (three) times  daily with meals.   Yes Historical Provider, MD  aspirin (ASPIRIN EC) 81 MG EC tablet Take 81 mg by mouth daily. Swallow whole.   Yes Historical Provider, MD  carvedilol (COREG) 6.25 MG tablet Take 6.25 mg by mouth 2 (two) times daily with a meal.   Yes Historical Provider, MD  Cholecalciferol (VITAMIN D-3) 1000 UNITS CAPS Take 2 capsules by mouth daily.   Yes Historical Provider, MD  ciprofloxacin (CIPRO) 500 MG tablet Take 1 tablet (500 mg total) by mouth 2 (two) times daily. 05/19/15  Yes Cammie Sickle, MD  furosemide (LASIX) 40  MG tablet Take 40 mg by mouth 2 (two) times daily.   Yes Historical Provider, MD  Magnesium Oxide 500 MG TABS Take 1 tablet by mouth 2 (two) times daily.   Yes Historical Provider, MD  meclizine (ANTIVERT) 25 MG tablet Take 1 tablet (25 mg total) by mouth 3 (three) times daily as needed for dizziness. 05/19/15  Yes Cammie Sickle, MD  metroNIDAZOLE (FLAGYL) 500 MG tablet Take 1 tablet (500 mg total) by mouth 3 (three) times daily. 05/19/15  Yes Cammie Sickle, MD  omeprazole (PRILOSEC) 20 MG capsule Take 20 mg by mouth daily.   Yes Historical Provider, MD  oxyCODONE-acetaminophen (ROXICET) 5-325 MG tablet Take 1 tablet by mouth every 12 (twelve) hours as needed for moderate pain. 05/19/15  Yes Cammie Sickle, MD  spironolactone (ALDACTONE) 25 MG tablet Take 25 mg by mouth 2 (two) times daily.   Yes Historical Provider, MD  temazepam (RESTORIL) 15 MG capsule Take 15 mg by mouth at bedtime as needed for sleep.   Yes Historical Provider, MD  vitamin C (ASCORBIC ACID) 500 MG tablet Take 500 mg by mouth daily.   Yes Historical Provider, MD      VITAL SIGNS:  Blood pressure 116/53, pulse 105, temperature 97.7 F (36.5 C), temperature source Oral, resp. rate 24, height 5\' 3"  (1.6 m), weight 73.936 kg (163 lb), SpO2 100 %.  PHYSICAL EXAMINATION:  GENERAL:  81 y.o.-year-old patient lying in the bed with no acute distress.  EYES: Pupils equal, round, reactive to light and accommodation. No scleral icterus. Extraocular muscles intact.  HEENT: Head atraumatic, normocephalic. Oropharynx and nasopharynx clear.  NECK:  Supple, no jugular venous distention. No thyroid enlargement, no tenderness.  LUNGS: Normal breath sounds bilaterally, no wheezing, rales,rhonchi or crepitation. No use of accessory muscles of respiration.  CARDIOVASCULAR: S1, S2 normal. No murmurs, rubs, or gallops.  ABDOMEN: , nondistended. Bowel sounds present. No organomegaly or mass.  mild midepigastric tenderness  present /abdominal scar is well-healed. Marland Kitchen EXTREMITIES: No pedal edema, cyanosis, or clubbing.  NEUROLOGIC: Cranial nerves II through XII are intact. Muscle strength 5/5 in all extremities. Sensation intact. Gait not checked.  PSYCHIATRIC: The patient is alert and oriented x 3.  SKIN: No obvious rash, lesion, or ulcer.   LABORATORY PANEL:   CBC  Recent Labs Lab 05/29/15 1051  WBC 7.1  HGB 10.9*  HCT 32.7*  PLT 281   ------------------------------------------------------------------------------------------------------------------  Chemistries   Recent Labs Lab 05/29/15 1051  NA 137  K 4.2  CL 103  CO2 25  GLUCOSE 93  BUN 20  CREATININE 1.11*  CALCIUM 9.4  AST 13*  ALT 6*  ALKPHOS 57  BILITOT 0.2*   ------------------------------------------------------------------------------------------------------------------  Cardiac Enzymes  Recent Labs Lab 05/29/15 1051  TROPONINI <0.03   ------------------------------------------------------------------------------------------------------------------  RADIOLOGY:  Dg Chest 2 View  05/29/2015  CLINICAL DATA:  Chest pain, wheezing, vomiting,  and shortness of breath this morning, history blood clots PE/DVT, CHF, MI, hypertension EXAM: CHEST  2 VIEW COMPARISON:  None. FINDINGS: Enlargement of cardiac silhouette with minimal pulmonary vascular congestion. Atherosclerotic calcification of a tortuous thoracic aorta. Elevation of RIGHT diaphragm RIGHT basilar atelectasis. No acute infiltrate, pleural effusion or pneumothorax. Bones unremarkable. IMPRESSION: Enlargement of cardiac silhouette with slight pulmonary vascular congestion. Elevated RIGHT diaphragm with RIGHT basilar atelectasis. Electronically Signed   By: Lavonia Dana M.D.   On: 05/29/2015 09:57   Ct Angio Chest Pe W/cm &/or Wo Cm  05/29/2015  CLINICAL DATA:  Acute onset of severe chest pain along with nausea and vomiting. History of colon cancer. EXAM: CT ANGIOGRAPHY CHEST  CT ABDOMEN AND PELVIS WITH CONTRAST TECHNIQUE: Multidetector CT imaging of the chest was performed using the standard protocol during bolus administration of intravenous contrast. Multiplanar CT image reconstructions and MIPs were obtained to evaluate the vascular anatomy. Multidetector CT imaging of the abdomen and pelvis was performed using the standard protocol during bolus administration of intravenous contrast. CONTRAST:  38mL OMNIPAQUE IOHEXOL 350 MG/ML SOLN COMPARISON:  CT scan 05/16/2015 FINDINGS: CTA CHEST FINDINGS Mediastinum/Nodes: No breast masses, supraclavicular or axillary lymphadenopathy. The heart is mildly enlarged. There is a small pericardial effusion. Tortuosity, ectasia and calcification of the thoracic aorta. No dissection. The pulmonary arterial tree is fairly well opacified. No filling defects to suggest pulmonary embolism. No mediastinal or hilar mass or adenopathy. Small scattered lymph nodes are noted. The esophagus is grossly normal. Lungs/Pleura: No acute pulmonary findings. No infiltrates, edema or effusions. There is significant elevation of the right hemidiaphragm with some overlying vascular crowding and atelectasis. Musculoskeletal: No significant bony findings. Moderate degenerative changes involving the thoracic spine. No canal compromise. CT ABDOMEN and PELVIS FINDINGS Hepatobiliary: Stable numerous hepatic cysts. No worrisome hepatic lesions or intrahepatic biliary dilatation. The gallbladder is normal. No common bile duct dilatation. Pancreas: Stable mild dilatation of the main pancreatic duct and stable small cystic areas in the pancreatic head, body and tail. No acute inflammation. Spleen: Normal size.  No focal lesions. Adrenals/Urinary Tract: The adrenal glands and kidneys are unremarkable except for small cysts and mild scarring changes. No renal or obstructing ureteral calculi. Thickening of the right adrenal gland without definite mass. Stomach/Bowel: The stomach,  duodenum, small bowel and colon are grossly normal. No inflammatory changes, mass lesions or obstructive findings. Moderate sigmoid diverticulosis without definite findings for acute diverticulitis. Vascular/Lymphatic: Stable atherosclerotic calcifications involving the aorta and branch vessels. Stable IVC filter. Small scattered mesenteric and retroperitoneal lymph nodes but no mass or adenopathy. Other: Stable calcified uterine fibroids. Slightly thickened endometrium for age. It measures approximately 7 mm. Pelvic ultrasound may be helpful for further evaluation and followup. Stable area of ill-defined soft tissue thickening surrounding fat in the upper pelvic mesenteric just above the bladder. This could be related to previous surgery or mesenteric infarct. No inguinal mass or adenopathy. Small inguinal hernias containing fat. Musculoskeletal: No significant bony findings. Review of the MIP images confirms the above findings. IMPRESSION: 1. No CT findings for pulmonary embolism. 2. No aortic aneurysm or dissection. 3. Small pericardial effusion. 4. No acute pulmonary findings. 5. Marked elevation of the right hemidiaphragm with overlying vascular crowding and atelectasis. 6. Stable hepatic cysts. 7. Stable pancreatic ductal dilatation and small cysts. 8. Slightly prominent endometrium for age. Recommend pelvic ultrasound examination for better evaluation and followup. 9. Stable area of probable postoperative scarring change or old mesenteric infarct in the upper pelvis. Electronically Signed  By: Marijo Sanes M.D.   On: 05/29/2015 14:18   Ct Abdomen Pelvis W Contrast  05/29/2015  CLINICAL DATA:  Acute onset of severe chest pain along with nausea and vomiting. History of colon cancer. EXAM: CT ANGIOGRAPHY CHEST CT ABDOMEN AND PELVIS WITH CONTRAST TECHNIQUE: Multidetector CT imaging of the chest was performed using the standard protocol during bolus administration of intravenous contrast. Multiplanar CT image  reconstructions and MIPs were obtained to evaluate the vascular anatomy. Multidetector CT imaging of the abdomen and pelvis was performed using the standard protocol during bolus administration of intravenous contrast. CONTRAST:  75mL OMNIPAQUE IOHEXOL 350 MG/ML SOLN COMPARISON:  CT scan 05/16/2015 FINDINGS: CTA CHEST FINDINGS Mediastinum/Nodes: No breast masses, supraclavicular or axillary lymphadenopathy. The heart is mildly enlarged. There is a small pericardial effusion. Tortuosity, ectasia and calcification of the thoracic aorta. No dissection. The pulmonary arterial tree is fairly well opacified. No filling defects to suggest pulmonary embolism. No mediastinal or hilar mass or adenopathy. Small scattered lymph nodes are noted. The esophagus is grossly normal. Lungs/Pleura: No acute pulmonary findings. No infiltrates, edema or effusions. There is significant elevation of the right hemidiaphragm with some overlying vascular crowding and atelectasis. Musculoskeletal: No significant bony findings. Moderate degenerative changes involving the thoracic spine. No canal compromise. CT ABDOMEN and PELVIS FINDINGS Hepatobiliary: Stable numerous hepatic cysts. No worrisome hepatic lesions or intrahepatic biliary dilatation. The gallbladder is normal. No common bile duct dilatation. Pancreas: Stable mild dilatation of the main pancreatic duct and stable small cystic areas in the pancreatic head, body and tail. No acute inflammation. Spleen: Normal size.  No focal lesions. Adrenals/Urinary Tract: The adrenal glands and kidneys are unremarkable except for small cysts and mild scarring changes. No renal or obstructing ureteral calculi. Thickening of the right adrenal gland without definite mass. Stomach/Bowel: The stomach, duodenum, small bowel and colon are grossly normal. No inflammatory changes, mass lesions or obstructive findings. Moderate sigmoid diverticulosis without definite findings for acute diverticulitis.  Vascular/Lymphatic: Stable atherosclerotic calcifications involving the aorta and branch vessels. Stable IVC filter. Small scattered mesenteric and retroperitoneal lymph nodes but no mass or adenopathy. Other: Stable calcified uterine fibroids. Slightly thickened endometrium for age. It measures approximately 7 mm. Pelvic ultrasound may be helpful for further evaluation and followup. Stable area of ill-defined soft tissue thickening surrounding fat in the upper pelvic mesenteric just above the bladder. This could be related to previous surgery or mesenteric infarct. No inguinal mass or adenopathy. Small inguinal hernias containing fat. Musculoskeletal: No significant bony findings. Review of the MIP images confirms the above findings. IMPRESSION: 1. No CT findings for pulmonary embolism. 2. No aortic aneurysm or dissection. 3. Small pericardial effusion. 4. No acute pulmonary findings. 5. Marked elevation of the right hemidiaphragm with overlying vascular crowding and atelectasis. 6. Stable hepatic cysts. 7. Stable pancreatic ductal dilatation and small cysts. 8. Slightly prominent endometrium for age. Recommend pelvic ultrasound examination for better evaluation and followup. 9. Stable area of probable postoperative scarring change or old mesenteric infarct in the upper pelvis. Electronically Signed   By: Marijo Sanes M.D.   On: 05/29/2015 14:18    EKG:   Orders placed or performed during the hospital encounter of 05/29/15  . EKG 12-Lead  . EKG 12-Lead  . EKG 12-Lead  . EKG 12-Lead    IMPRESSION AND PLAN:   #1 epigastric pain and nausea likely due to acute gastritis: Continue IV PPIs, IV Zofran, IV hydration. #2 chest pain and exertional dyspnea: Has moderate pericardial  effusion: Echocardiogram showed moderate medical effusion but no hemodynamic compromise. No hypoxia, no hypertension.. Consult cardiology, continue aspirin. #3 possible right-sided infiltrate: Use doxycycline for possible  aspiration event. #4 stage III colon cancer with possible metastases to liver: Follows up with oncology Dr. Lenetta Quaker. Possible recent diverticulitis she received Cipro and Flagyl and she finished it. CODE STATUS DO NOT RESUSCITATE discussed with the patient's daughter. #6 history of DVT and PE unable to tolerate anticoagulants secondary to GI bleed .status post IVC filter. CT chest did not show any PE at this time.    All the records are reviewed and case discussed with ED provider. Management plans discussed with the patient, family and they are in agreement.  CODE STATUS: DNR  TOTAL TIME TAKING CARE OF THIS PATIENT: 87 min    Iretta Mangrum M.D on 05/29/2015 at 5:51 PM  Between 7am to 6pm - Pager - [phone]  After 6pm go to [email or web address] - password [email or web address] [facility]  [facility]  Office  [phone]  CC: Primary care physician; No PCP Per Patient  Note: This dictation was prepared with Dragon dictation along with smaller phrase technology. Any transcriptional errors that result from this process are unintentional.

## 2015-05-29 NOTE — ED Notes (Signed)
Introduced self to patient and family. Family at bedside. Updated patient and family on current plan of care.

## 2015-05-29 NOTE — ED Provider Notes (Signed)
CSN: [id number]     Arrival date & time 05/29/15  [id number] History   First MD Initiated Contact with Patient 05/29/15 [phone]     Chief Complaint  Patient presents with  . Chest Pain     (Consider location/radiation/quality/duration/timing/severity/associated sxs/prior Treatment) The history is provided by the patient.  Jill Bowers is a 81 y.o. female hx of colon cancer s/p resection, DVT/PE with IVC filter, GI bleed, CAD with MI here with chest pain, shortness of breath. Woke up this morning with acute onset of chest pain or shortness of breath. States that it's substernal and she is feeling short winded. Denies any cough or fever. Denies any worsening of chronic abdominal pain. Of note, patient had DVT and PE after her colon cancer resection in [place]. She was put on blood thinner but then had a GI bleed so just has a IVC filter currently. Was seen in ED recently for abdominal pain and CT showed liver cysts and no obvious cancer recurrence. Has seen oncology here but has no other doctors here.    Past Medical History  Diagnosis Date  . Colon cancer ([place])   . Coronary artery disease   . Hypertension   . Blood clot associated with vein wall inflammation   . Anemia   . DVT (deep venous thrombosis) ([place])   . PE (pulmonary embolism)   . Hyperkalemia   . GERD (gastroesophageal reflux disease)   . Vitamin D deficiency   . Renal disorder   . Gastrointestinal hemorrhage   . Malignant neoplasm of stomach ([place])   . CHF (congestive heart failure) ([place])   . Renal insufficiency    Past Surgical History  Procedure Laterality Date  . Ivc filter placement ([facility] hx)    . Colon surgery    . Abdominal hysterectomy    . Joint replacement Left     knee   No family history on file. Social History  Substance Use Topics  . Smoking status: Never Smoker   . Smokeless tobacco: None  . Alcohol Use: No   OB History    No data available     Review of Systems  Cardiovascular: Positive for  chest pain.  All other systems reviewed and are negative.     Allergies  Nsaids; Reglan; Statins; and Torsemide  Home Medications   Prior to Admission medications   Medication Sig Start Date End Date Taking? Authorizing Provider  acetaminophen (TYLENOL) 650 MG CR tablet Take 650 mg by mouth 3 (three) times daily with meals.    Historical Provider, MD  aspirin 81 MG chewable tablet Chew 81 mg by mouth daily.    Historical Provider, MD  carvedilol (COREG) 6.25 MG tablet Take 6.25 mg by mouth 2 (two) times daily with a meal.    Historical Provider, MD  Cholecalciferol (VITAMIN D-3) 1000 UNITS CAPS Take 2 capsules by mouth daily.    Historical Provider, MD  ciprofloxacin (CIPRO) 500 MG tablet Take 1 tablet (500 mg total) by mouth 2 (two) times daily. 05/19/15   Cammie Sickle, MD  furosemide (LASIX) 40 MG tablet Take 40 mg by mouth 2 (two) times daily.    Historical Provider, MD  Magnesium Oxide 500 MG TABS Take 1 tablet by mouth 2 (two) times daily.    Historical Provider, MD  meclizine (ANTIVERT) 25 MG tablet Take 1 tablet (25 mg total) by mouth 3 (three) times daily as needed for dizziness. 05/19/15   Cammie Sickle, MD  metroNIDAZOLE (FLAGYL) 500  MG tablet Take 1 tablet (500 mg total) by mouth 3 (three) times daily. 05/19/15   Cammie Sickle, MD  omeprazole (PRILOSEC) 20 MG capsule Take 20 mg by mouth daily.    Historical Provider, MD  oxyCODONE-acetaminophen (ROXICET) 5-325 MG tablet Take 1 tablet by mouth every 12 (twelve) hours as needed for moderate pain. 05/19/15   Cammie Sickle, MD  spironolactone (ALDACTONE) 25 MG tablet Take 25 mg by mouth 2 (two) times daily.    Historical Provider, MD  temazepam (RESTORIL) 15 MG capsule Take 15 mg by mouth at bedtime as needed for sleep.    Historical Provider, MD  traMADol (ULTRAM) 50 MG tablet Take by mouth every 6 (six) hours as needed.    Historical Provider, MD  vitamin C (ASCORBIC ACID) 500 MG tablet Take 500 mg by  mouth daily.    Historical Provider, MD   BP 116/53 mmHg  Pulse 105  Temp(Src) 97.7 F (36.5 C) (Oral)  Resp 24  Ht 5\' 3"  (1.6 m)  Wt 163 lb (73.936 kg)  BMI 28.88 kg/m2  SpO2 100% Physical Exam  Constitutional: She is oriented to person, place, and time.  Chronically ill, tachypneic   HENT:  Head: Normocephalic.  Mouth/Throat: Oropharynx is clear and moist.  Eyes: Conjunctivae are normal. Pupils are equal, round, and reactive to light.  Neck: Normal range of motion. Neck supple.  Cardiovascular: Normal rate, regular rhythm and normal heart sounds.   Pulmonary/Chest:  Crackles R base.   Abdominal: Soft. Bowel sounds are normal. She exhibits no distension. There is no tenderness. There is no rebound.  Musculoskeletal: Normal range of motion. She exhibits no edema or tenderness.  Neurological: She is alert and oriented to person, place, and time. No cranial nerve deficit. Coordination normal.  Skin: Skin is warm and dry.  Psychiatric: She has a normal mood and affect. Her behavior is normal. Judgment and thought content normal.  Nursing note and vitals reviewed.   ED Course  Procedures (including critical care time)  Angiocath insertion Performed by: Darl Householder, Dory Verdun  Consent: Verbal consent obtained. Risks and benefits: risks, benefits and alternatives were discussed Time out: Immediately prior to procedure a "time out" was called to verify the correct patient, procedure, equipment, support staff and site/side marked as required.  Preparation: Patient was prepped and draped in the usual sterile fashion.  Vein Location: R antecube  Ultrasound Guided  Gauge: 20 long   Normal blood return and flush without difficulty Patient tolerance: Patient tolerated the procedure well with no immediate complications.     Labs Review Labs Reviewed  CBC WITH DIFFERENTIAL/PLATELET - Abnormal; Notable for the following:    RBC 3.65 (*)    Hemoglobin 10.9 (*)    HCT 32.7 (*)    RDW  16.6 (*)    All other components within normal limits  COMPREHENSIVE METABOLIC PANEL - Abnormal; Notable for the following:    Creatinine, Ser 1.11 (*)    Albumin 3.4 (*)    AST 13 (*)    ALT 6 (*)    Total Bilirubin 0.2 (*)    GFR calc non Af Amer 44 (*)    GFR calc Af Amer 51 (*)    All other components within normal limits  BRAIN NATRIURETIC PEPTIDE - Abnormal; Notable for the following:    B Natriuretic Peptide 139.0 (*)    All other components within normal limits  TROPONIN I  LACTIC ACID, PLASMA    Imaging Review Dg Chest  2 View  05/29/2015  CLINICAL DATA:  Chest pain, wheezing, vomiting, and shortness of breath this morning, history blood clots PE/DVT, CHF, MI, hypertension EXAM: CHEST  2 VIEW COMPARISON:  None. FINDINGS: Enlargement of cardiac silhouette with minimal pulmonary vascular congestion. Atherosclerotic calcification of a tortuous thoracic aorta. Elevation of RIGHT diaphragm RIGHT basilar atelectasis. No acute infiltrate, pleural effusion or pneumothorax. Bones unremarkable. IMPRESSION: Enlargement of cardiac silhouette with slight pulmonary vascular congestion. Elevated RIGHT diaphragm with RIGHT basilar atelectasis. Electronically Signed   By: Lavonia Dana M.D.   On: 05/29/2015 09:57   Ct Angio Chest Pe W/cm &/or Wo Cm  05/29/2015  CLINICAL DATA:  Acute onset of severe chest pain along with nausea and vomiting. History of colon cancer. EXAM: CT ANGIOGRAPHY CHEST CT ABDOMEN AND PELVIS WITH CONTRAST TECHNIQUE: Multidetector CT imaging of the chest was performed using the standard protocol during bolus administration of intravenous contrast. Multiplanar CT image reconstructions and MIPs were obtained to evaluate the vascular anatomy. Multidetector CT imaging of the abdomen and pelvis was performed using the standard protocol during bolus administration of intravenous contrast. CONTRAST:  45mL OMNIPAQUE IOHEXOL 350 MG/ML SOLN COMPARISON:  CT scan 05/16/2015 FINDINGS: CTA CHEST  FINDINGS Mediastinum/Nodes: No breast masses, supraclavicular or axillary lymphadenopathy. The heart is mildly enlarged. There is a small pericardial effusion. Tortuosity, ectasia and calcification of the thoracic aorta. No dissection. The pulmonary arterial tree is fairly well opacified. No filling defects to suggest pulmonary embolism. No mediastinal or hilar mass or adenopathy. Small scattered lymph nodes are noted. The esophagus is grossly normal. Lungs/Pleura: No acute pulmonary findings. No infiltrates, edema or effusions. There is significant elevation of the right hemidiaphragm with some overlying vascular crowding and atelectasis. Musculoskeletal: No significant bony findings. Moderate degenerative changes involving the thoracic spine. No canal compromise. CT ABDOMEN and PELVIS FINDINGS Hepatobiliary: Stable numerous hepatic cysts. No worrisome hepatic lesions or intrahepatic biliary dilatation. The gallbladder is normal. No common bile duct dilatation. Pancreas: Stable mild dilatation of the main pancreatic duct and stable small cystic areas in the pancreatic head, body and tail. No acute inflammation. Spleen: Normal size.  No focal lesions. Adrenals/Urinary Tract: The adrenal glands and kidneys are unremarkable except for small cysts and mild scarring changes. No renal or obstructing ureteral calculi. Thickening of the right adrenal gland without definite mass. Stomach/Bowel: The stomach, duodenum, small bowel and colon are grossly normal. No inflammatory changes, mass lesions or obstructive findings. Moderate sigmoid diverticulosis without definite findings for acute diverticulitis. Vascular/Lymphatic: Stable atherosclerotic calcifications involving the aorta and branch vessels. Stable IVC filter. Small scattered mesenteric and retroperitoneal lymph nodes but no mass or adenopathy. Other: Stable calcified uterine fibroids. Slightly thickened endometrium for age. It measures approximately 7 mm. Pelvic  ultrasound may be helpful for further evaluation and followup. Stable area of ill-defined soft tissue thickening surrounding fat in the upper pelvic mesenteric just above the bladder. This could be related to previous surgery or mesenteric infarct. No inguinal mass or adenopathy. Small inguinal hernias containing fat. Musculoskeletal: No significant bony findings. Review of the MIP images confirms the above findings. IMPRESSION: 1. No CT findings for pulmonary embolism. 2. No aortic aneurysm or dissection. 3. Small pericardial effusion. 4. No acute pulmonary findings. 5. Marked elevation of the right hemidiaphragm with overlying vascular crowding and atelectasis. 6. Stable hepatic cysts. 7. Stable pancreatic ductal dilatation and small cysts. 8. Slightly prominent endometrium for age. Recommend pelvic ultrasound examination for better evaluation and followup. 9. Stable area of probable postoperative  scarring change or old mesenteric infarct in the upper pelvis. Electronically Signed   By: Marijo Sanes M.D.   On: 05/29/2015 14:18   Ct Abdomen Pelvis W Contrast  05/29/2015  CLINICAL DATA:  Acute onset of severe chest pain along with nausea and vomiting. History of colon cancer. EXAM: CT ANGIOGRAPHY CHEST CT ABDOMEN AND PELVIS WITH CONTRAST TECHNIQUE: Multidetector CT imaging of the chest was performed using the standard protocol during bolus administration of intravenous contrast. Multiplanar CT image reconstructions and MIPs were obtained to evaluate the vascular anatomy. Multidetector CT imaging of the abdomen and pelvis was performed using the standard protocol during bolus administration of intravenous contrast. CONTRAST:  71mL OMNIPAQUE IOHEXOL 350 MG/ML SOLN COMPARISON:  CT scan 05/16/2015 FINDINGS: CTA CHEST FINDINGS Mediastinum/Nodes: No breast masses, supraclavicular or axillary lymphadenopathy. The heart is mildly enlarged. There is a small pericardial effusion. Tortuosity, ectasia and calcification of  the thoracic aorta. No dissection. The pulmonary arterial tree is fairly well opacified. No filling defects to suggest pulmonary embolism. No mediastinal or hilar mass or adenopathy. Small scattered lymph nodes are noted. The esophagus is grossly normal. Lungs/Pleura: No acute pulmonary findings. No infiltrates, edema or effusions. There is significant elevation of the right hemidiaphragm with some overlying vascular crowding and atelectasis. Musculoskeletal: No significant bony findings. Moderate degenerative changes involving the thoracic spine. No canal compromise. CT ABDOMEN and PELVIS FINDINGS Hepatobiliary: Stable numerous hepatic cysts. No worrisome hepatic lesions or intrahepatic biliary dilatation. The gallbladder is normal. No common bile duct dilatation. Pancreas: Stable mild dilatation of the main pancreatic duct and stable small cystic areas in the pancreatic head, body and tail. No acute inflammation. Spleen: Normal size.  No focal lesions. Adrenals/Urinary Tract: The adrenal glands and kidneys are unremarkable except for small cysts and mild scarring changes. No renal or obstructing ureteral calculi. Thickening of the right adrenal gland without definite mass. Stomach/Bowel: The stomach, duodenum, small bowel and colon are grossly normal. No inflammatory changes, mass lesions or obstructive findings. Moderate sigmoid diverticulosis without definite findings for acute diverticulitis. Vascular/Lymphatic: Stable atherosclerotic calcifications involving the aorta and branch vessels. Stable IVC filter. Small scattered mesenteric and retroperitoneal lymph nodes but no mass or adenopathy. Other: Stable calcified uterine fibroids. Slightly thickened endometrium for age. It measures approximately 7 mm. Pelvic ultrasound may be helpful for further evaluation and followup. Stable area of ill-defined soft tissue thickening surrounding fat in the upper pelvic mesenteric just above the bladder. This could be  related to previous surgery or mesenteric infarct. No inguinal mass or adenopathy. Small inguinal hernias containing fat. Musculoskeletal: No significant bony findings. Review of the MIP images confirms the above findings. IMPRESSION: 1. No CT findings for pulmonary embolism. 2. No aortic aneurysm or dissection. 3. Small pericardial effusion. 4. No acute pulmonary findings. 5. Marked elevation of the right hemidiaphragm with overlying vascular crowding and atelectasis. 6. Stable hepatic cysts. 7. Stable pancreatic ductal dilatation and small cysts. 8. Slightly prominent endometrium for age. Recommend pelvic ultrasound examination for better evaluation and followup. 9. Stable area of probable postoperative scarring change or old mesenteric infarct in the upper pelvis. Electronically Signed   By: Marijo Sanes M.D.   On: 05/29/2015 14:18   I have personally reviewed and evaluated these images and lab results as part of my medical decision-making.   EKG Interpretation None      ED ECG REPORT I, Harjit Leider, the attending physician, personally viewed and interpreted this ECG.   Date: 05/29/2015  EKG Time: 9:07  am  Rate: 54  Rhythm: normal EKG, normal sinus rhythm  Axis: R axis  Intervals:none  ST&T Change: biphasic T waves laterally    MDM   Final diagnoses:  None    Jill Bowers is a 81 y.o. female here with chest pain, shortness of breath. Hx of PE and MI. Will get CT angio, labs, trop. Will likely need admission.  2:30 PM Patient developed ab pain in the ED. CT angio chest showed small pericardial effusion. Mildly tachy, not hypoxic or hypotensive. No signs of tamponade. Will admit for echo for pericardial effusion.      Wandra Arthurs, MD 05/29/15 [phone]

## 2015-05-29 NOTE — Progress Notes (Signed)
Pt admitted to room 239 from ED. Pt and family oriented to unit and room. Telemetry initiated, bed alarm activated. Skin team checked with [facility] [place]. All belongings within reach will continue to monitor.

## 2015-05-29 NOTE — Progress Notes (Signed)
*  PRELIMINARY RESULTS* Echocardiogram 2D Echocardiogram has been performed.  Jill Bowers 05/29/2015, 3:43 PM

## 2015-05-29 NOTE — ED Notes (Signed)
Pt arrived via [facility] from home where she lives with her daughter  - daughter is at bedside   Pt reports 8/10  Mid sternal chest pain that began this am also nausea with vomiting  Pt has been treated for colon CA  Weakness noted upon transfer

## 2015-05-30 DIAGNOSIS — R1013 Epigastric pain: Secondary | ICD-10-CM | POA: Diagnosis not present

## 2015-05-30 DIAGNOSIS — I10 Essential (primary) hypertension: Secondary | ICD-10-CM | POA: Diagnosis not present

## 2015-05-30 DIAGNOSIS — R079 Chest pain, unspecified: Secondary | ICD-10-CM | POA: Diagnosis not present

## 2015-05-30 DIAGNOSIS — R112 Nausea with vomiting, unspecified: Secondary | ICD-10-CM | POA: Diagnosis not present

## 2015-05-30 DIAGNOSIS — I313 Pericardial effusion (noninflammatory): Secondary | ICD-10-CM | POA: Diagnosis not present

## 2015-05-30 LAB — CBC
HCT: 30.2 % — ABNORMAL LOW (ref 35.0–47.0)
Hemoglobin: 9.7 g/dL — ABNORMAL LOW (ref 12.0–16.0)
MCH: 29.1 pg (ref 26.0–34.0)
MCHC: 32.2 g/dL (ref 32.0–36.0)
MCV: 90.3 fL (ref 80.0–100.0)
PLATELETS: 260 10*3/uL (ref 150–440)
RBC: 3.35 MIL/uL — ABNORMAL LOW (ref 3.80–5.20)
RDW: 16.6 % — ABNORMAL HIGH (ref 11.5–14.5)
WBC: 5.4 10*3/uL (ref 3.6–11.0)

## 2015-05-30 LAB — BASIC METABOLIC PANEL
Anion gap: 4 — ABNORMAL LOW (ref 5–15)
BUN: 15 mg/dL (ref 6–20)
CALCIUM: 8.9 mg/dL (ref 8.9–10.3)
CO2: 25 mmol/L (ref 22–32)
CREATININE: 0.82 mg/dL (ref 0.44–1.00)
Chloride: 106 mmol/L (ref 101–111)
GFR calc non Af Amer: >60 mL/min (ref >60)
GLUCOSE: 82 mg/dL (ref 65–99)
Potassium: 4 mmol/L (ref 3.5–5.1)
Sodium: 135 mmol/L (ref 135–145)

## 2015-05-30 MED ORDER — PANTOPRAZOLE SODIUM 40 MG PO TBEC: 40.0000 mg | DELAYED_RELEASE_TABLET | Freq: Two times a day (BID) | ORAL | Status: DC

## 2015-05-30 NOTE — Plan of Care (Signed)
Problem: Safety: Goal: Ability to remain free from injury will improve Outcome: Progressing Fall precautions in place  Problem: Pain Managment: Goal: General experience of comfort will improve Outcome: Progressing Prn meds

## 2015-05-30 NOTE — Discharge Summary (Signed)
Jill Bowers at [place]    MR#:  [id number]  DATE OF BIRTH:  02-Jul-1928  DATE OF ADMISSION:  05/29/2015 ADMITTING PHYSICIAN: Jill Lesches, MD  DATE OF DISCHARGE: 05/30/2015  PRIMARY CARE PHYSICIAN: No PCP Per Patient    ADMISSION DIAGNOSIS:  Pericardial effusion [I31.9]  DISCHARGE DIAGNOSIS:  Active Problems:   Chest pain   Gastritis.  SECONDARY DIAGNOSIS:   Past Medical History  Diagnosis Date  . Colon cancer ([place])   . Coronary artery disease   . Hypertension   . Blood clot associated with vein wall inflammation   . Anemia   . DVT (deep venous thrombosis) ([place])   . PE (pulmonary embolism)   . Hyperkalemia   . GERD (gastroesophageal reflux disease)   . Vitamin D deficiency   . Renal disorder   . Gastrointestinal hemorrhage   . Malignant neoplasm of stomach ([place])   . CHF (congestive heart failure) ([place])   . Renal insufficiency     HOSPITAL COURSE:   #1 epigastric pain and nausea likely due to acute gastritis:    Given IV PPIs, IV Zofran, IV hydration.   Resolved, feels better now.    Has hx of PUD- will d/c on PPI BID- and advised to follow with GI clinic in 2 weeks. #2 chest pain and exertional dyspnea: Has moderate pericardial effusion: Echocardiogram showed moderate medical effusion but no hemodynamic compromise. No hypoxia, no hypertension.. No need to Consult cardiology, continue aspirin. #3 possible right-sided infiltrate: CT chest confirms atelactesis- no need for Abx. #4 stage III colon cancer with possible metastases to liver: Follows up with oncology Dr. Lenetta Bowers. Possible recent diverticulitis she received Cipro and Flagyl and she finished it. CODE STATUS DO NOT RESUSCITATE discussed with the patient's daughter. #6 history of DVT and PE unable to tolerate anticoagulants secondary to GI bleed .status post IVC filter. CT chest did not show any PE at this time.  DISCHARGE CONDITIONS:    Stable.  CONSULTS OBTAINED:  Treatment Team:  Jill Dresser, MD Jill Hampshire, MD  DRUG ALLERGIES:   Allergies  Allergen Reactions  . Nsaids Other (See Comments)  . Reglan [Metoclopramide] Other (See Comments)  . Statins Other (See Comments)  . Torsemide Other (See Comments)    DISCHARGE MEDICATIONS:   Current Discharge Medication List    START taking these medications   Details  pantoprazole (PROTONIX) 40 MG tablet Take 1 tablet (40 mg total) by mouth 2 (two) times daily. Qty: 60 tablet, Refills: 0      CONTINUE these medications which have NOT CHANGED   Details  acetaminophen (TYLENOL) 650 MG CR tablet Take 650 mg by mouth 3 (three) times daily with meals.    aspirin (ASPIRIN EC) 81 MG EC tablet Take 81 mg by mouth daily. Swallow whole.    carvedilol (COREG) 6.25 MG tablet Take 6.25 mg by mouth 2 (two) times daily with a meal.    Cholecalciferol (VITAMIN D-3) 1000 UNITS CAPS Take 2 capsules by mouth daily.    furosemide (LASIX) 40 MG tablet Take 40 mg by mouth 2 (two) times daily.    Magnesium Oxide 500 MG TABS Take 1 tablet by mouth 2 (two) times daily.    meclizine (ANTIVERT) 25 MG tablet Take 1 tablet (25 mg total) by mouth 3 (three) times daily as needed for dizziness. Qty: 30 tablet, Refills: 0    oxyCODONE-acetaminophen (ROXICET) 5-325 MG tablet Take 1 tablet by  mouth every 12 (twelve) hours as needed for moderate pain. Qty: 20 tablet, Refills: 0    spironolactone (ALDACTONE) 25 MG tablet Take 25 mg by mouth 2 (two) times daily.    temazepam (RESTORIL) 15 MG capsule Take 15 mg by mouth at bedtime as needed for sleep.    vitamin C (ASCORBIC ACID) 500 MG tablet Take 500 mg by mouth daily.      STOP taking these medications     ciprofloxacin (CIPRO) 500 MG tablet      metroNIDAZOLE (FLAGYL) 500 MG tablet      omeprazole (PRILOSEC) 20 MG capsule          DISCHARGE INSTRUCTIONS:    Follow with GI clinic in 2 weeks.  If you experience  worsening of your admission symptoms, develop shortness of breath, life threatening emergency, suicidal or homicidal thoughts you must seek medical attention immediately by calling 911 or calling your MD immediately  if symptoms less severe.  You Must read complete instructions/literature along with all the possible adverse reactions/side effects for all the Medicines you take and that have been prescribed to you. Take any new Medicines after you have completely understood and accept all the possible adverse reactions/side effects.   Please note  You were cared for by a hospitalist during your hospital stay. If you have any questions about your discharge medications or the care you received while you were in the hospital after you are discharged, you can call the unit and asked to speak with the hospitalist on call if the hospitalist that took care of you is not available. Once you are discharged, your primary care physician will handle any further medical issues. Please note that NO REFILLS for any discharge medications will be authorized once you are discharged, as it is imperative that you return to your primary care physician (or establish a relationship with a primary care physician if you do not have one) for your aftercare needs so that they can reassess your need for medications and monitor your lab values.    Today   CHIEF COMPLAINT:   Chief Complaint  Patient presents with  . Chest Pain    HISTORY OF PRESENT ILLNESS:  Jill Bowers  is a 81 y.o. female with a known history of hypertension, stage III colon cancer, PE, DVT comes in with epigastric pain with multiple episodes of nausea and vomiting today. Patient also had chest pain at one point. But patient main complaint is mid epigastric pain not radiating and type associated with multiple episodes of nausea and vomiting. No shortness of breath. Had the episode of chest discomfort in the midsternal which is resolved . Has dementia. The  history obtained from the daughter. . 'll moved from [place] 3 weeks ago. Had a colon cancer surgery in July in [place], 18 inches of colon was removed. Postoperatively the patient had a PE, DVT unable to tolerate the blood thinners secondary to GI bleed so patient had IVC filter. Patient currently has no PCP here follows up with Dr. Charlaine Dalton from oncology.   VITAL SIGNS:  Blood pressure 116/53, pulse 64, temperature 98.3 F (36.8 C), temperature source Oral, resp. rate 16, height 5\' 3"  (1.6 m), weight 73.936 kg (163 lb), SpO2 100 %.  I/O:   Intake/Output Summary (Last 24 hours) at 05/30/15 1115 Last data filed at 05/30/15 0829  Gross per 24 hour  Intake    150 ml  Output   1000 ml  Net   -  850 ml    PHYSICAL EXAMINATION:   GENERAL: 81 y.o.-year-old patient lying in the bed with no acute distress.  EYES: Pupils equal, round, reactive to light and accommodation. No scleral icterus. Extraocular muscles intact.  HEENT: Head atraumatic, normocephalic. Oropharynx and nasopharynx clear.  NECK: Supple, no jugular venous distention. No thyroid enlargement, no tenderness.  LUNGS: Normal breath sounds bilaterally, no wheezing, rales,rhonchi or crepitation. No use of accessory muscles of respiration.  CARDIOVASCULAR: S1, S2 normal. No murmurs, rubs, or gallops.  ABDOMEN: , nondistended. Bowel sounds present. No organomegaly or mass. mild midepigastric tenderness present /abdominal scar is well-healed. Marland Kitchen EXTREMITIES: No pedal edema, cyanosis, or clubbing.  NEUROLOGIC: Cranial nerves II through XII are intact. Muscle strength 5/5 in all extremities. Sensation intact. Gait not checked.  PSYCHIATRIC: The patient is alert and oriented x 3.  SKIN: No obvious rash, lesion, or ulcer.   DATA REVIEW:   CBC  Recent Labs Lab 05/30/15 0440  WBC 5.4  HGB 9.7*  HCT 30.2*  PLT 260    Chemistries   Recent Labs Lab 05/29/15 1051 05/30/15 0440  NA 137 135  K  4.2 4.0  CL 103 106  CO2 25 25  GLUCOSE 93 82  BUN 20 15  CREATININE 1.11* 0.82  CALCIUM 9.4 8.9  AST 13*  --   ALT 6*  --   ALKPHOS 57  --   BILITOT 0.2*  --     Cardiac Enzymes  Recent Labs Lab 05/29/15 1051  TROPONINI <0.03    Microbiology Results  No results found for this or any previous visit.  RADIOLOGY:  Dg Chest 2 View  05/29/2015  CLINICAL DATA:  Chest pain, wheezing, vomiting, and shortness of breath this morning, history blood clots PE/DVT, CHF, MI, hypertension EXAM: CHEST  2 VIEW COMPARISON:  None. FINDINGS: Enlargement of cardiac silhouette with minimal pulmonary vascular congestion. Atherosclerotic calcification of a tortuous thoracic aorta. Elevation of RIGHT diaphragm RIGHT basilar atelectasis. No acute infiltrate, pleural effusion or pneumothorax. Bones unremarkable. IMPRESSION: Enlargement of cardiac silhouette with slight pulmonary vascular congestion. Elevated RIGHT diaphragm with RIGHT basilar atelectasis. Electronically Signed   By: Lavonia Dana M.D.   On: 05/29/2015 09:57   Ct Angio Chest Pe W/cm &/or Wo Cm  05/29/2015  CLINICAL DATA:  Acute onset of severe chest pain along with nausea and vomiting. History of colon cancer. EXAM: CT ANGIOGRAPHY CHEST CT ABDOMEN AND PELVIS WITH CONTRAST TECHNIQUE: Multidetector CT imaging of the chest was performed using the standard protocol during bolus administration of intravenous contrast. Multiplanar CT image reconstructions and MIPs were obtained to evaluate the vascular anatomy. Multidetector CT imaging of the abdomen and pelvis was performed using the standard protocol during bolus administration of intravenous contrast. CONTRAST:  19mL OMNIPAQUE IOHEXOL 350 MG/ML SOLN COMPARISON:  CT scan 05/16/2015 FINDINGS: CTA CHEST FINDINGS Mediastinum/Nodes: No breast masses, supraclavicular or axillary lymphadenopathy. The heart is mildly enlarged. There is a small pericardial effusion. Tortuosity, ectasia and calcification of the  thoracic aorta. No dissection. The pulmonary arterial tree is fairly well opacified. No filling defects to suggest pulmonary embolism. No mediastinal or hilar mass or adenopathy. Small scattered lymph nodes are noted. The esophagus is grossly normal. Lungs/Pleura: No acute pulmonary findings. No infiltrates, edema or effusions. There is significant elevation of the right hemidiaphragm with some overlying vascular crowding and atelectasis. Musculoskeletal: No significant bony findings. Moderate degenerative changes involving the thoracic spine. No canal compromise. CT ABDOMEN and PELVIS FINDINGS Hepatobiliary: Stable numerous hepatic cysts.  No worrisome hepatic lesions or intrahepatic biliary dilatation. The gallbladder is normal. No common bile duct dilatation. Pancreas: Stable mild dilatation of the main pancreatic duct and stable small cystic areas in the pancreatic head, body and tail. No acute inflammation. Spleen: Normal size.  No focal lesions. Adrenals/Urinary Tract: The adrenal glands and kidneys are unremarkable except for small cysts and mild scarring changes. No renal or obstructing ureteral calculi. Thickening of the right adrenal gland without definite mass. Stomach/Bowel: The stomach, duodenum, small bowel and colon are grossly normal. No inflammatory changes, mass lesions or obstructive findings. Moderate sigmoid diverticulosis without definite findings for acute diverticulitis. Vascular/Lymphatic: Stable atherosclerotic calcifications involving the aorta and branch vessels. Stable IVC filter. Small scattered mesenteric and retroperitoneal lymph nodes but no mass or adenopathy. Other: Stable calcified uterine fibroids. Slightly thickened endometrium for age. It measures approximately 7 mm. Pelvic ultrasound may be helpful for further evaluation and followup. Stable area of ill-defined soft tissue thickening surrounding fat in the upper pelvic mesenteric just above the bladder. This could be related to  previous surgery or mesenteric infarct. No inguinal mass or adenopathy. Small inguinal hernias containing fat. Musculoskeletal: No significant bony findings. Review of the MIP images confirms the above findings. IMPRESSION: 1. No CT findings for pulmonary embolism. 2. No aortic aneurysm or dissection. 3. Small pericardial effusion. 4. No acute pulmonary findings. 5. Marked elevation of the right hemidiaphragm with overlying vascular crowding and atelectasis. 6. Stable hepatic cysts. 7. Stable pancreatic ductal dilatation and small cysts. 8. Slightly prominent endometrium for age. Recommend pelvic ultrasound examination for better evaluation and followup. 9. Stable area of probable postoperative scarring change or old mesenteric infarct in the upper pelvis. Electronically Signed   By: Marijo Sanes M.D.   On: 05/29/2015 14:18   Ct Abdomen Pelvis W Contrast  05/29/2015  CLINICAL DATA:  Acute onset of severe chest pain along with nausea and vomiting. History of colon cancer. EXAM: CT ANGIOGRAPHY CHEST CT ABDOMEN AND PELVIS WITH CONTRAST TECHNIQUE: Multidetector CT imaging of the chest was performed using the standard protocol during bolus administration of intravenous contrast. Multiplanar CT image reconstructions and MIPs were obtained to evaluate the vascular anatomy. Multidetector CT imaging of the abdomen and pelvis was performed using the standard protocol during bolus administration of intravenous contrast. CONTRAST:  12mL OMNIPAQUE IOHEXOL 350 MG/ML SOLN COMPARISON:  CT scan 05/16/2015 FINDINGS: CTA CHEST FINDINGS Mediastinum/Nodes: No breast masses, supraclavicular or axillary lymphadenopathy. The heart is mildly enlarged. There is a small pericardial effusion. Tortuosity, ectasia and calcification of the thoracic aorta. No dissection. The pulmonary arterial tree is fairly well opacified. No filling defects to suggest pulmonary embolism. No mediastinal or hilar mass or adenopathy. Small scattered lymph nodes  are noted. The esophagus is grossly normal. Lungs/Pleura: No acute pulmonary findings. No infiltrates, edema or effusions. There is significant elevation of the right hemidiaphragm with some overlying vascular crowding and atelectasis. Musculoskeletal: No significant bony findings. Moderate degenerative changes involving the thoracic spine. No canal compromise. CT ABDOMEN and PELVIS FINDINGS Hepatobiliary: Stable numerous hepatic cysts. No worrisome hepatic lesions or intrahepatic biliary dilatation. The gallbladder is normal. No common bile duct dilatation. Pancreas: Stable mild dilatation of the main pancreatic duct and stable small cystic areas in the pancreatic head, body and tail. No acute inflammation. Spleen: Normal size.  No focal lesions. Adrenals/Urinary Tract: The adrenal glands and kidneys are unremarkable except for small cysts and mild scarring changes. No renal or obstructing ureteral calculi. Thickening of the right adrenal  gland without definite mass. Stomach/Bowel: The stomach, duodenum, small bowel and colon are grossly normal. No inflammatory changes, mass lesions or obstructive findings. Moderate sigmoid diverticulosis without definite findings for acute diverticulitis. Vascular/Lymphatic: Stable atherosclerotic calcifications involving the aorta and branch vessels. Stable IVC filter. Small scattered mesenteric and retroperitoneal lymph nodes but no mass or adenopathy. Other: Stable calcified uterine fibroids. Slightly thickened endometrium for age. It measures approximately 7 mm. Pelvic ultrasound may be helpful for further evaluation and followup. Stable area of ill-defined soft tissue thickening surrounding fat in the upper pelvic mesenteric just above the bladder. This could be related to previous surgery or mesenteric infarct. No inguinal mass or adenopathy. Small inguinal hernias containing fat. Musculoskeletal: No significant bony findings. Review of the MIP images confirms the above  findings. IMPRESSION: 1. No CT findings for pulmonary embolism. 2. No aortic aneurysm or dissection. 3. Small pericardial effusion. 4. No acute pulmonary findings. 5. Marked elevation of the right hemidiaphragm with overlying vascular crowding and atelectasis. 6. Stable hepatic cysts. 7. Stable pancreatic ductal dilatation and small cysts. 8. Slightly prominent endometrium for age. Recommend pelvic ultrasound examination for better evaluation and followup. 9. Stable area of probable postoperative scarring change or old mesenteric infarct in the upper pelvis. Electronically Signed   By: Marijo Sanes M.D.   On: 05/29/2015 14:18     Management plans discussed with the patient, family and they are in agreement.  CODE STATUS:     Code Status Orders        Start     Ordered   05/29/15 1751  Do not attempt resuscitation (DNR)   Continuous    Question Answer Comment  In the event of cardiac or respiratory ARREST Do not call a "code blue"   In the event of cardiac or respiratory ARREST Do not perform Intubation, CPR, defibrillation or ACLS   In the event of cardiac or respiratory ARREST Use medication by any route, position, wound care, and other measures to relive pain and suffering. May use oxygen, suction and manual treatment of airway obstruction as needed for comfort.   Comments nurse may pronounce      05/29/15 1751    Advance Directive Documentation        Most Recent Value   Type of Advance Directive  Healthcare Power of Attorney   Pre-existing out of facility DNR order (yellow form or pink MOST form)     "MOST" Form in Place?        TOTAL TIME TAKING CARE OF THIS PATIENT: 35 minutes.    Vaughan Basta M.D on 05/30/2015 at 11:15 AM  Between 7am to 6pm - Pager - [phone]  After 6pm go to [email or web address] - password [email or web address]  [place] [facility]  Office  [phone]  CC: Primary care physician; No PCP Per Patient   Note: This dictation was prepared with  Dragon dictation along with smaller phrase technology. Any transcriptional errors that result from this process are unintentional.

## 2015-05-30 NOTE — Care Management Obs Status (Signed)
[place] NOTIFICATION   Patient Details  Name: Jill Bowers MRN: [id number] Date of Birth: March 06, 1929   Medicare Observation Status Notification Given:  Yes    Katrina Stack, RN 05/30/2015, 11:30 AM

## 2015-06-02 ENCOUNTER — Inpatient Hospital Stay: Payer: Medicare Other | Attending: Internal Medicine

## 2015-06-02 ENCOUNTER — Inpatient Hospital Stay (HOSPITAL_BASED_OUTPATIENT_CLINIC_OR_DEPARTMENT_OTHER): Payer: Medicare Other | Admitting: Internal Medicine

## 2015-06-02 VITALS — BP 93/53 | HR 52 | Temp 98.3°F | Resp 18 | Ht 63.0 in | Wt 163.0 lb

## 2015-06-02 DIAGNOSIS — C772 Secondary and unspecified malignant neoplasm of intra-abdominal lymph nodes: Principal | ICD-10-CM

## 2015-06-02 DIAGNOSIS — Z8719 Personal history of other diseases of the digestive system: Secondary | ICD-10-CM | POA: Insufficient documentation

## 2015-06-02 DIAGNOSIS — I2510 Atherosclerotic heart disease of native coronary artery without angina pectoris: Secondary | ICD-10-CM | POA: Insufficient documentation

## 2015-06-02 DIAGNOSIS — Z9049 Acquired absence of other specified parts of digestive tract: Secondary | ICD-10-CM | POA: Diagnosis not present

## 2015-06-02 DIAGNOSIS — R42 Dizziness and giddiness: Secondary | ICD-10-CM | POA: Insufficient documentation

## 2015-06-02 DIAGNOSIS — I509 Heart failure, unspecified: Secondary | ICD-10-CM | POA: Diagnosis not present

## 2015-06-02 DIAGNOSIS — C189 Malignant neoplasm of colon, unspecified: Secondary | ICD-10-CM | POA: Insufficient documentation

## 2015-06-02 DIAGNOSIS — R109 Unspecified abdominal pain: Secondary | ICD-10-CM | POA: Diagnosis not present

## 2015-06-02 DIAGNOSIS — K219 Gastro-esophageal reflux disease without esophagitis: Secondary | ICD-10-CM

## 2015-06-02 DIAGNOSIS — Z86718 Personal history of other venous thrombosis and embolism: Secondary | ICD-10-CM | POA: Insufficient documentation

## 2015-06-02 DIAGNOSIS — I10 Essential (primary) hypertension: Secondary | ICD-10-CM

## 2015-06-02 DIAGNOSIS — Z79899 Other long term (current) drug therapy: Secondary | ICD-10-CM | POA: Diagnosis not present

## 2015-06-02 DIAGNOSIS — Z7982 Long term (current) use of aspirin: Secondary | ICD-10-CM | POA: Insufficient documentation

## 2015-06-02 DIAGNOSIS — Z86711 Personal history of pulmonary embolism: Secondary | ICD-10-CM | POA: Insufficient documentation

## 2015-06-02 DIAGNOSIS — K7689 Other specified diseases of liver: Secondary | ICD-10-CM | POA: Insufficient documentation

## 2015-06-02 LAB — CBC WITH DIFFERENTIAL/PLATELET
BASOS ABS: 0.1 10*3/uL (ref 0–0.1)
Basophils Relative: 2 %
EOS PCT: 2 %
Eosinophils Absolute: 0.1 10*3/uL (ref 0–0.7)
HEMATOCRIT: 32.7 % — AB (ref 35.0–47.0)
Hemoglobin: 10.7 g/dL — ABNORMAL LOW (ref 12.0–16.0)
LYMPHS PCT: 42 %
Lymphs Abs: 1.8 10*3/uL (ref 1.0–3.6)
MCH: 29.4 pg (ref 26.0–34.0)
MCHC: 32.8 g/dL (ref 32.0–36.0)
MCV: 89.7 fL (ref 80.0–100.0)
MONO ABS: 0.3 10*3/uL (ref 0.2–0.9)
MONOS PCT: 7 %
NEUTROS ABS: 2 10*3/uL (ref 1.4–6.5)
Neutrophils Relative %: 47 %
PLATELETS: 300 10*3/uL (ref 150–440)
RBC: 3.65 MIL/uL — ABNORMAL LOW (ref 3.80–5.20)
RDW: 17 % — AB (ref 11.5–14.5)
WBC: 4.2 10*3/uL (ref 3.6–11.0)

## 2015-06-02 LAB — COMPREHENSIVE METABOLIC PANEL
ALT: 7 U/L — ABNORMAL LOW (ref 14–54)
ANION GAP: 9 (ref 5–15)
AST: 12 U/L — AB (ref 15–41)
Albumin: 3.3 g/dL — ABNORMAL LOW (ref 3.5–5.0)
Alkaline Phosphatase: 55 U/L (ref 38–126)
BILIRUBIN TOTAL: 0.4 mg/dL (ref 0.3–1.2)
BUN: 21 mg/dL — AB (ref 6–20)
CHLORIDE: 100 mmol/L — AB (ref 101–111)
CO2: 25 mmol/L (ref 22–32)
Calcium: 9 mg/dL (ref 8.9–10.3)
Creatinine, Ser: 1.21 mg/dL — ABNORMAL HIGH (ref 0.44–1.00)
GFR, EST AFRICAN AMERICAN: 46 mL/min — AB (ref >60)
GFR, EST NON AFRICAN AMERICAN: 40 mL/min — AB (ref >60)
Glucose, Bld: 125 mg/dL — ABNORMAL HIGH (ref 65–99)
POTASSIUM: 4.4 mmol/L (ref 3.5–5.1)
Sodium: 134 mmol/L — ABNORMAL LOW (ref 135–145)
TOTAL PROTEIN: 6.7 g/dL (ref 6.5–8.1)

## 2015-06-02 NOTE — Progress Notes (Signed)
[facility] OFFICE PROGRESS NOTE  Patient Care Team: No Pcp Per Patient as PCP - General (General Practice)   SUMMARY OF ONCOLOGIC HISTORY:  # July 2016- COLON CANCER; mod diff adeno; STAGE III (pT4aN1a; MSI-STABLE) [s/p surgery;Dr. Arbutus [facility] [place]];No adjuvant chemo sec to co-morbdities DEC 2016- CT-C/ A/P [[facility]]- subcentimeter hepatic cysts ; irregular soft tissue density in the peritoneal fat within pelvis ? Scarring  [less likely recurrence]   # July R LE DVT/Bil PE s/p IVC filter; Hx of GIB; AKI; Hx of Severe Anemia; hx CHF  INTERVAL HISTORY:  I reviewed the patient's operative note/hospital course from July 2016 at the time of her colon resection in detail; however no pathology report available. Summary as above.  81 year old female patient with above history of stage III [as per family] is here to review the results of her restaging CAT scans/also reviewed the next treatment plan. Unfortunately interim patient was in the hospital for abdominal pain-diagnosed with gastritis.   Patient has been having on and off abdominal pain/around the umbilicus- chronic intermittent. Some improvement noted on the Percocet. Denies any ongoing constipation. Denies any blood in stools. Appetite is fair.   REVIEW OF SYSTEMS:  A complete 10 point review of system is done which is negative except mentioned above/history of present illness.   PAST MEDICAL HISTORY :  Past Medical History  Diagnosis Date  . Colon cancer ([place])   . Coronary artery disease   . Hypertension   . Blood clot associated with vein wall inflammation   . Anemia   . DVT (deep venous thrombosis) ([place])   . PE (pulmonary embolism)   . Hyperkalemia   . GERD (gastroesophageal reflux disease)   . Vitamin D deficiency   . Renal disorder   . Gastrointestinal hemorrhage   . Malignant neoplasm of stomach ([place])   . CHF (congestive heart failure) ([place])   . Renal insufficiency     PAST SURGICAL HISTORY :   Past  Surgical History  Procedure Laterality Date  . Ivc filter placement ([facility] hx)    . Colon surgery    . Abdominal hysterectomy    . Joint replacement Left     knee    FAMILY HISTORY :  No family history on file.  SOCIAL HISTORY:   Social History  Substance Use Topics  . Smoking status: Never Smoker   . Smokeless tobacco: Not on file  . Alcohol Use: No    ALLERGIES:  is allergic to nsaids; reglan; statins; and torsemide.  MEDICATIONS:  Current Outpatient Prescriptions  Medication Sig Dispense Refill  . acetaminophen (TYLENOL) 650 MG CR tablet Take 650 mg by mouth 3 (three) times daily with meals.    Marland Kitchen aspirin (ASPIRIN EC) 81 MG EC tablet Take 81 mg by mouth daily. Swallow whole.    . carvedilol (COREG) 6.25 MG tablet Take 6.25 mg by mouth 2 (two) times daily with a meal.    . Cholecalciferol (VITAMIN D-3) 1000 UNITS CAPS Take 2 capsules by mouth daily.    . furosemide (LASIX) 40 MG tablet Take 40 mg by mouth 2 (two) times daily.    . Magnesium Oxide 500 MG TABS Take 1 tablet by mouth 2 (two) times daily.    . meclizine (ANTIVERT) 25 MG tablet Take 1 tablet (25 mg total) by mouth 3 (three) times daily as needed for dizziness. 30 tablet 0  . oxyCODONE-acetaminophen (ROXICET) 5-325 MG tablet Take 1 tablet by mouth every 12 (twelve) hours as  needed for moderate pain. 20 tablet 0  . pantoprazole (PROTONIX) 40 MG tablet Take 1 tablet (40 mg total) by mouth 2 (two) times daily. 60 tablet 0  . spironolactone (ALDACTONE) 25 MG tablet Take 25 mg by mouth 2 (two) times daily.    . temazepam (RESTORIL) 15 MG capsule Take 15 mg by mouth at bedtime as needed for sleep.    . vitamin C (ASCORBIC ACID) 500 MG tablet Take 500 mg by mouth daily.     No current facility-administered medications for this visit.    PHYSICAL EXAMINATION: ECOG PERFORMANCE STATUS: 3 - Symptomatic, >50% confined to bed  BP 93/53 mmHg  Pulse 52  Temp(Src) 98.3 F (36.8 C) (Oral)  Resp 18  Ht '5\' 3"'  (1.6 m)  Wt 163  lb (73.936 kg)  BMI 28.88 kg/m2  Filed Weights   06/02/15 1349  Weight: 163 lb (73.936 kg)    GENERAL: Well-nourished well-developed; Alert, no distress and comfortable.   Accompanied by her daughter.    LABORATORY DATA:  I have reviewed the data as listed    Component Value Date/Time   NA 134* 06/02/2015 1331   K 4.4 06/02/2015 1331   CL 100* 06/02/2015 1331   CO2 25 06/02/2015 1331   GLUCOSE 125* 06/02/2015 1331   BUN 21* 06/02/2015 1331   CREATININE 1.21* 06/02/2015 1331   CALCIUM 9.0 06/02/2015 1331   PROT 6.7 06/02/2015 1331   ALBUMIN 3.3* 06/02/2015 1331   AST 12* 06/02/2015 1331   ALT 7* 06/02/2015 1331   ALKPHOS 55 06/02/2015 1331   BILITOT 0.4 06/02/2015 1331   GFRNONAA 40* 06/02/2015 1331   GFRAA 46* 06/02/2015 1331    No results found for: SPEP, UPEP  Lab Results  Component Value Date   WBC 4.2 06/02/2015   NEUTROABS 2.0 06/02/2015   HGB 10.7* 06/02/2015   HCT 32.7* 06/02/2015   MCV 89.7 06/02/2015   PLT 300 06/02/2015      Chemistry      Component Value Date/Time   NA 134* 06/02/2015 1331   K 4.4 06/02/2015 1331   CL 100* 06/02/2015 1331   CO2 25 06/02/2015 1331   BUN 21* 06/02/2015 1331   CREATININE 1.21* 06/02/2015 1331      Component Value Date/Time   CALCIUM 9.0 06/02/2015 1331   ALKPHOS 55 06/02/2015 1331   AST 12* 06/02/2015 1331   ALT 7* 06/02/2015 1331   BILITOT 0.4 06/02/2015 1331       RADIOGRAPHIC STUDIES: I have personally reviewed the radiological images as listed and agreed with the findings in the report. No results found.   ASSESSMENT & PLAN:   # Colon cancer-stage III [pT4N1a] with multiple liver cysts on the CT scan]. No evidence of any metastatic malignancy noted on the scans. I reviewed this with the patient and her daughter in detail. I again reviewed with the patient and daughter- that her risk of recurrence is fairly high- however adjuvant chemotherapy is not recommended given her multiple comorbidities/poor  performance status. They agree.  # Abdominal pain- unclear etiology is likely secondary to scar tissue from surgery. Recommend current pain control. She is to follow up with PCP for continued management.  # Dizzy spells- likely benign positional vertigo- improved status post meclizine.  #Multiple cysts in the liver noted on the CT scan-likely benign.  # I recommend follow-up in approximately 6 months/scans before the visit/labs. I reviewed the images independently/summarized above.  # 25 minutes face-to-face with the patient discussing  the above plan of care; more than 50% of time spent on prognosis/ natural history; counseling and coordination.      Cammie Sickle, MD 06/02/2015 2:07 PM

## 2015-06-03 LAB — CEA: CEA: 5.4 ng/mL — ABNORMAL HIGH (ref 0.0–4.7)

## 2015-06-12 ENCOUNTER — Ambulatory Visit (INDEPENDENT_AMBULATORY_CARE_PROVIDER_SITE_OTHER): Payer: Medicare Other | Admitting: Internal Medicine

## 2015-06-12 ENCOUNTER — Encounter: Payer: Self-pay | Admitting: Internal Medicine

## 2015-06-12 VITALS — BP 104/56 | HR 50 | Temp 98.6°F | Wt 169.0 lb

## 2015-06-12 DIAGNOSIS — I10 Essential (primary) hypertension: Secondary | ICD-10-CM | POA: Diagnosis not present

## 2015-06-12 DIAGNOSIS — I25810 Atherosclerosis of coronary artery bypass graft(s) without angina pectoris: Secondary | ICD-10-CM

## 2015-06-12 DIAGNOSIS — I509 Heart failure, unspecified: Secondary | ICD-10-CM

## 2015-06-12 DIAGNOSIS — E559 Vitamin D deficiency, unspecified: Secondary | ICD-10-CM | POA: Diagnosis not present

## 2015-06-12 DIAGNOSIS — C189 Malignant neoplasm of colon, unspecified: Secondary | ICD-10-CM

## 2015-06-12 DIAGNOSIS — K219 Gastro-esophageal reflux disease without esophagitis: Secondary | ICD-10-CM | POA: Insufficient documentation

## 2015-06-12 DIAGNOSIS — C772 Secondary and unspecified malignant neoplasm of intra-abdominal lymph nodes: Secondary | ICD-10-CM

## 2015-06-12 DIAGNOSIS — Z86711 Personal history of pulmonary embolism: Secondary | ICD-10-CM | POA: Insufficient documentation

## 2015-06-12 DIAGNOSIS — Z86718 Personal history of other venous thrombosis and embolism: Secondary | ICD-10-CM | POA: Insufficient documentation

## 2015-06-12 LAB — VITAMIN D 25 HYDROXY (VIT D DEFICIENCY, FRACTURES): VITD: 28.57 ng/mL — AB (ref 30.00–100.00)

## 2015-06-12 LAB — COMPREHENSIVE METABOLIC PANEL
ALBUMIN: 3.3 g/dL — AB (ref 3.5–5.2)
ALK PHOS: 49 U/L (ref 39–117)
ALT: 6 U/L (ref 0–35)
AST: 11 U/L (ref 0–37)
BILIRUBIN TOTAL: 0.4 mg/dL (ref 0.2–1.2)
BUN: 23 mg/dL (ref 6–23)
CO2: 34 mEq/L — ABNORMAL HIGH (ref 19–32)
CREATININE: 1.03 mg/dL (ref 0.40–1.20)
Calcium: 9.8 mg/dL (ref 8.4–10.5)
Chloride: 104 mEq/L (ref 96–112)
GFR: 65.62 mL/min (ref >60.00)
GLUCOSE: 99 mg/dL (ref 70–99)
Potassium: 4.5 mEq/L (ref 3.5–5.1)
SODIUM: 141 meq/L (ref 135–145)
TOTAL PROTEIN: 6.5 g/dL (ref 6.0–8.3)

## 2015-06-12 LAB — CBC
HEMATOCRIT: 32.6 % — AB (ref 36.0–46.0)
Hemoglobin: 10.6 g/dL — ABNORMAL LOW (ref 12.0–15.0)
MCHC: 32.7 g/dL (ref 30.0–36.0)
MCV: 91.2 fl (ref 78.0–100.0)
Platelets: 232 10*3/uL (ref 150.0–400.0)
RBC: 3.57 Mil/uL — ABNORMAL LOW (ref 3.87–5.11)
RDW: 17.2 % — AB (ref 11.5–15.5)
WBC: 4.8 10*3/uL (ref 4.0–10.5)

## 2015-06-12 LAB — HEMOGLOBIN A1C: HEMOGLOBIN A1C: 5.8 % (ref 4.6–6.5)

## 2015-06-12 LAB — LIPID PANEL
CHOLESTEROL: 172 mg/dL (ref 0–200)
HDL: 52.6 mg/dL (ref >39.00)
LDL Cholesterol: 89 mg/dL (ref 0–99)
NONHDL: 119.52
Total CHOL/HDL Ratio: 3
Triglycerides: 155 mg/dL — ABNORMAL HIGH (ref 0.0–149.0)
VLDL: 31 mg/dL (ref 0.0–40.0)

## 2015-06-12 LAB — BRAIN NATRIURETIC PEPTIDE: Pro B Natriuretic peptide (BNP): 255 pg/mL — ABNORMAL HIGH (ref 0.0–100.0)

## 2015-06-12 MED ORDER — OXYCODONE-ACETAMINOPHEN 5-325 MG PO TABS: 1.0000 | ORAL_TABLET | Freq: Two times a day (BID) | ORAL | Status: DC | PRN

## 2015-06-12 NOTE — Assessment & Plan Note (Signed)
Compensated Some lower extremity edema Check BNP, CMET today Consider cutting back on diuretics d/t low BP Referral placed to cardiology

## 2015-06-12 NOTE — Progress Notes (Signed)
HPI  Pt presents to the clinic today to establish care and for management of the conditions listed below. She recently moved from [place].  Colon Cancer, stage 3: s/p resection in July 2016. No chemo or radiation. She has already established care with oncology here. She does have chronic stomach pain, she is taking Tylenol BID and Oxycodone daily every evening. She will need a refill of the Oxycodone today.  CAD: She occasionally will have chest pain. She has not established care with a cardiologist, and would like a referral. She is on Carvedilol. She is not on any cholesterol lowering medication. She does take a baby ASA daily.  HTN: BP on the low side. She is on Carvedilol, Spironolactone and Lasix. She has had a few falls this year secondary to leg weakness, but has not had any syncopal episodes.  History of DVT/PE: 02/2015. She was taken off her anticoagulation secondary to GI bleeding. Now only on ASA.  GERD: Symptoms well controlled on Protonix.  Vit D Deficiency: She does take Vit D OTC daily. She has had 2 falls this year, but no fractures.  CHF: Compensated. She is on Carvedilol, Spironolactone and Lasix. She does have some swelling in the lower extremities but denies shortness of breath.  Past Medical History  Diagnosis Date  . Colon cancer ([place])   . Coronary artery disease   . Hypertension   . Blood clot associated with vein wall inflammation   . Anemia   . DVT (deep venous thrombosis) ([place])   . PE (pulmonary embolism)   . Hyperkalemia   . GERD (gastroesophageal reflux disease)   . Vitamin D deficiency   . Renal disorder   . Gastrointestinal hemorrhage   . Malignant neoplasm of stomach ([place])   . CHF (congestive heart failure) ([place])   . Renal insufficiency   . Chicken pox   . Frequent headaches     Current Outpatient Prescriptions  Medication Sig Dispense Refill  . acetaminophen (TYLENOL) 650 MG CR tablet Take 650 mg by mouth 3 (three) times daily with meals.     Marland Kitchen aspirin (ASPIRIN EC) 81 MG EC tablet Take 81 mg by mouth daily. Swallow whole.    . carvedilol (COREG) 6.25 MG tablet Take 6.25 mg by mouth 2 (two) times daily with a meal.    . Cholecalciferol (VITAMIN D-3) 1000 UNITS CAPS Take 2 capsules by mouth daily.    . furosemide (LASIX) 40 MG tablet Take 40 mg by mouth 2 (two) times daily.    . Magnesium Oxide 500 MG TABS Take 1 tablet by mouth 2 (two) times daily.    . meclizine (ANTIVERT) 25 MG tablet Take 1 tablet (25 mg total) by mouth 3 (three) times daily as needed for dizziness. 30 tablet 0  . oxyCODONE-acetaminophen (ROXICET) 5-325 MG tablet Take 1 tablet by mouth every 12 (twelve) hours as needed for moderate pain. 20 tablet 0  . pantoprazole (PROTONIX) 40 MG tablet Take 1 tablet (40 mg total) by mouth 2 (two) times daily. 60 tablet 0  . spironolactone (ALDACTONE) 25 MG tablet Take 25 mg by mouth 2 (two) times daily.    . temazepam (RESTORIL) 15 MG capsule Take 15 mg by mouth at bedtime as needed for sleep.    . vitamin C (ASCORBIC ACID) 500 MG tablet Take 500 mg by mouth daily.     No current facility-administered medications for this visit.    Allergies  Allergen Reactions  . Nsaids Other (See Comments)  .  Reglan [Metoclopramide] Other (See Comments)  . Statins Other (See Comments)  . Torsemide Other (See Comments)    Family History  Problem Relation Age of Onset  . Arthritis Mother   . Hypertension Mother   . Colon cancer Sister   . Heart disease Daughter   . Colon cancer Son     Social History   Social History  . Marital Status: Widowed    Spouse Name: N/A  . Number of Children: N/A  . Years of Education: N/A   Occupational History  . Not on file.   Social History Main Topics  . Smoking status: Never Smoker   . Smokeless tobacco: Not on file  . Alcohol Use: No  . Drug Use: Not on file  . Sexual Activity: Not on file   Other Topics Concern  . Not on file   Social History Narrative     ROS:  Constitutional: Pt reports fatigue. Denies fever, malaise,, headache or abrupt weight changes.  HEENT: Pt reports blurred vision. Denies eye pain, eye redness, ear pain, ringing in the ears, wax buildup, runny nose, nasal congestion, bloody nose, or sore throat. Respiratory: Denies difficulty breathing, shortness of breath, cough or sputum production.   Cardiovascular: Pt reports chest tightness and swelling in her feet. Denies chest pain, chest tightness, palpitations or swelling in the hands.  Gastrointestinal: Pt reports abdominal pain. Denies bloating, constipation, diarrhea or blood in the stool.  GU: Denies frequency, urgency, pain with urination, blood in urine, odor or discharge. Skin: Denies redness, rashes, lesions or ulcercations.  Neurological: Pt reports difficulty with balance. Denies dizziness, difficulty with memory, difficulty with speech or problems with coordination.  Psych: Denies anxiety, depression, SI/HI.  No other specific complaints in a complete review of systems (except as listed in HPI above).  PE:  BP 104/56 mmHg  Pulse 50  Temp(Src) 98.6 F (37 C) (Oral)  Wt 169 lb (76.658 kg)  SpO2 96% Wt Readings from Last 3 Encounters:  06/12/15 169 lb (76.658 kg)  06/02/15 163 lb (73.936 kg)  05/29/15 163 lb (73.936 kg)    General: Appears her stated age, chronically ill appearing, in NAD. Neck: No adenopathy.  Cardiovascular: Normal rate and rhythm. S1,S2 noted.  No murmur, rubs or gallops noted. Pitting BLE edema, L>R. Pulmonary/Chest: Normal effort and positive vesicular breath sounds. No respiratory distress. No wheezes, rales or ronchi noted.  Abdomen: Soft and generally tender. Normal bowel sounds. Musculoskeletal: Unable to assess, she can not get out of wheelchair. Neurological: Alert and oriented. Psychiatric: Mood and affect normal. Behavior is normal. Judgment and thought content normal.     BMET    Component Value Date/Time   NA 134*  06/02/2015 1331   K 4.4 06/02/2015 1331   CL 100* 06/02/2015 1331   CO2 25 06/02/2015 1331   GLUCOSE 125* 06/02/2015 1331   BUN 21* 06/02/2015 1331   CREATININE 1.21* 06/02/2015 1331   CALCIUM 9.0 06/02/2015 1331   GFRNONAA 40* 06/02/2015 1331   GFRAA 46* 06/02/2015 1331    Lipid Panel  No results found for: CHOL, TRIG, HDL, CHOLHDL, VLDL, LDLCALC  CBC    Component Value Date/Time   WBC 4.2 06/02/2015 1331   RBC 3.65* 06/02/2015 1331   HGB 10.7* 06/02/2015 1331   HCT 32.7* 06/02/2015 1331   PLT 300 06/02/2015 1331   MCV 89.7 06/02/2015 1331   MCH 29.4 06/02/2015 1331   MCHC 32.8 06/02/2015 1331   RDW 17.0* 06/02/2015 1331   LYMPHSABS  1.8 06/02/2015 1331   MONOABS 0.3 06/02/2015 1331   EOSABS 0.1 06/02/2015 1331   BASOSABS 0.1 06/02/2015 1331    Hgb A1C No results found for: HGBA1C   Assessment and Plan:

## 2015-06-12 NOTE — Assessment & Plan Note (Signed)
Unable to take anticoag secondary to bleeding Continue ASA

## 2015-06-12 NOTE — Assessment & Plan Note (Signed)
Will check Vit D level today 

## 2015-06-12 NOTE — Assessment & Plan Note (Signed)
Will check Lipid Profile and CMET today Continue ASA for now If LDL not at goal, consider starting statin therapy

## 2015-06-12 NOTE — Assessment & Plan Note (Signed)
Oxycodone refilled She will continue to follow up with oncology

## 2015-06-12 NOTE — Patient Instructions (Signed)
Fatigue  Fatigue is feeling tired all of the time, a lack of energy, or a lack of motivation. Occasional or mild fatigue is often a normal response to activity or life in general. However, long-lasting (chronic) or extreme fatigue may indicate an underlying medical condition.  HOME CARE INSTRUCTIONS   Watch your fatigue for any changes. The following actions may help to lessen any discomfort you are feeling:  · Talk to your health care provider about how much sleep you need each night. Try to get the required amount every night.  · Take medicines only as directed by your health care provider.  · Eat a healthy and nutritious diet. Ask your health care provider if you need help changing your diet.  · Drink enough fluid to keep your urine clear or pale yellow.  · Practice ways of relaxing, such as yoga, meditation, massage therapy, or acupuncture.  · Exercise regularly.    · Change situations that cause you stress. Try to keep your work and personal routine reasonable.  · Do not abuse illegal drugs.  · Limit alcohol intake to no more than 1 drink per day for nonpregnant women and 2 drinks per day for men. One drink equals 12 ounces of beer, 5 ounces of wine, or 1½ ounces of hard liquor.  · Take a multivitamin, if directed by your health care provider.  SEEK MEDICAL CARE IF:   · Your fatigue does not get better.  · You have a fever.    · You have unintentional weight loss or gain.  · You have headaches.    · You have difficulty:      Falling asleep.    Sleeping throughout the night.  · You feel angry, guilty, anxious, or sad.     · You are unable to have a bowel movement (constipation).    · You skin is dry.     · Your legs or another part of your body is swollen.    SEEK IMMEDIATE MEDICAL CARE IF:   · You feel confused.    · Your vision is blurry.  · You feel faint or pass out.    · You have a severe headache.    · You have severe abdominal, pelvic, or back pain.    · You have chest pain, shortness of breath, or an  irregular or fast heartbeat.    · You are unable to urinate or you urinate less than normal.    · You develop abnormal bleeding, such as bleeding from the rectum, vagina, nose, lungs, or nipples.  · You vomit blood.     · You have thoughts about harming yourself or committing suicide.    · You are worried that you might harm someone else.       This information is not intended to replace advice given to you by your health care provider. Make sure you discuss any questions you have with your health care provider.     Document Released: 04/11/2007 Document Revised: 07/05/2014 Document Reviewed: 10/16/2013  Elsevier Interactive Patient Education ©2016 [employer]

## 2015-06-12 NOTE — Progress Notes (Signed)
Pre visit review using our clinic review tool, if applicable. No additional management support is needed unless otherwise documented below in the visit note. 

## 2015-06-12 NOTE — Assessment & Plan Note (Signed)
She will continue Protonix

## 2015-06-12 NOTE — Assessment & Plan Note (Signed)
BP on the low end She needs Carvedilol Consider cutting back on Spironolactone or Lasix

## 2015-06-13 DIAGNOSIS — I509 Heart failure, unspecified: Secondary | ICD-10-CM | POA: Diagnosis not present

## 2015-06-13 DIAGNOSIS — I2510 Atherosclerotic heart disease of native coronary artery without angina pectoris: Secondary | ICD-10-CM | POA: Diagnosis not present

## 2015-06-13 DIAGNOSIS — C772 Secondary and unspecified malignant neoplasm of intra-abdominal lymph nodes: Secondary | ICD-10-CM | POA: Diagnosis not present

## 2015-06-13 DIAGNOSIS — C189 Malignant neoplasm of colon, unspecified: Secondary | ICD-10-CM | POA: Diagnosis not present

## 2015-06-13 DIAGNOSIS — Z9181 History of falling: Secondary | ICD-10-CM | POA: Diagnosis not present

## 2015-06-13 DIAGNOSIS — I110 Hypertensive heart disease with heart failure: Secondary | ICD-10-CM | POA: Diagnosis not present

## 2015-06-18 DIAGNOSIS — I110 Hypertensive heart disease with heart failure: Secondary | ICD-10-CM | POA: Diagnosis not present

## 2015-06-18 DIAGNOSIS — Z9181 History of falling: Secondary | ICD-10-CM | POA: Diagnosis not present

## 2015-06-18 DIAGNOSIS — I509 Heart failure, unspecified: Secondary | ICD-10-CM | POA: Diagnosis not present

## 2015-06-18 DIAGNOSIS — C772 Secondary and unspecified malignant neoplasm of intra-abdominal lymph nodes: Secondary | ICD-10-CM | POA: Diagnosis not present

## 2015-06-18 DIAGNOSIS — C189 Malignant neoplasm of colon, unspecified: Secondary | ICD-10-CM | POA: Diagnosis not present

## 2015-06-18 DIAGNOSIS — I2510 Atherosclerotic heart disease of native coronary artery without angina pectoris: Secondary | ICD-10-CM | POA: Diagnosis not present

## 2015-06-19 DIAGNOSIS — C189 Malignant neoplasm of colon, unspecified: Secondary | ICD-10-CM | POA: Diagnosis not present

## 2015-06-19 DIAGNOSIS — Z9181 History of falling: Secondary | ICD-10-CM | POA: Diagnosis not present

## 2015-06-19 DIAGNOSIS — C772 Secondary and unspecified malignant neoplasm of intra-abdominal lymph nodes: Secondary | ICD-10-CM | POA: Diagnosis not present

## 2015-06-19 DIAGNOSIS — I2510 Atherosclerotic heart disease of native coronary artery without angina pectoris: Secondary | ICD-10-CM | POA: Diagnosis not present

## 2015-06-19 DIAGNOSIS — I509 Heart failure, unspecified: Secondary | ICD-10-CM | POA: Diagnosis not present

## 2015-06-19 DIAGNOSIS — I110 Hypertensive heart disease with heart failure: Secondary | ICD-10-CM | POA: Diagnosis not present

## 2015-06-20 ENCOUNTER — Telehealth: Payer: Self-pay

## 2015-06-20 ENCOUNTER — Telehealth: Payer: Self-pay | Admitting: Internal Medicine

## 2015-06-20 DIAGNOSIS — Z9181 History of falling: Secondary | ICD-10-CM | POA: Diagnosis not present

## 2015-06-20 DIAGNOSIS — C772 Secondary and unspecified malignant neoplasm of intra-abdominal lymph nodes: Secondary | ICD-10-CM | POA: Diagnosis not present

## 2015-06-20 DIAGNOSIS — I509 Heart failure, unspecified: Secondary | ICD-10-CM | POA: Diagnosis not present

## 2015-06-20 DIAGNOSIS — C189 Malignant neoplasm of colon, unspecified: Secondary | ICD-10-CM | POA: Diagnosis not present

## 2015-06-20 DIAGNOSIS — I110 Hypertensive heart disease with heart failure: Secondary | ICD-10-CM | POA: Diagnosis not present

## 2015-06-20 DIAGNOSIS — Z7689 Persons encountering health services in other specified circumstances: Secondary | ICD-10-CM

## 2015-06-20 DIAGNOSIS — I2510 Atherosclerotic heart disease of native coronary artery without angina pectoris: Secondary | ICD-10-CM | POA: Diagnosis not present

## 2015-06-20 NOTE — Telephone Encounter (Signed)
Pt's daughter, [name], dropped off a request for independent personal care services for pt. Th best number to reach [name] is [phone]. Form can be faxed to [phone] when finished. Placing ppw in [facility].

## 2015-06-20 NOTE — Telephone Encounter (Signed)
Left message on voicemail.

## 2015-06-20 NOTE — Telephone Encounter (Signed)
[name] with [facility] left v/m requesting verbal order for home health PT 2  x a week for 6 weeks.

## 2015-06-20 NOTE — Telephone Encounter (Signed)
ok 

## 2015-06-24 NOTE — Telephone Encounter (Signed)
Form faxed to number given---copy sent to scan and for charge

## 2015-06-25 DIAGNOSIS — I509 Heart failure, unspecified: Secondary | ICD-10-CM | POA: Diagnosis not present

## 2015-06-25 DIAGNOSIS — I2510 Atherosclerotic heart disease of native coronary artery without angina pectoris: Secondary | ICD-10-CM | POA: Diagnosis not present

## 2015-06-25 DIAGNOSIS — C189 Malignant neoplasm of colon, unspecified: Secondary | ICD-10-CM | POA: Diagnosis not present

## 2015-06-25 DIAGNOSIS — C772 Secondary and unspecified malignant neoplasm of intra-abdominal lymph nodes: Secondary | ICD-10-CM | POA: Diagnosis not present

## 2015-06-25 DIAGNOSIS — Z9181 History of falling: Secondary | ICD-10-CM | POA: Diagnosis not present

## 2015-06-25 DIAGNOSIS — I110 Hypertensive heart disease with heart failure: Secondary | ICD-10-CM | POA: Diagnosis not present

## 2015-06-27 DIAGNOSIS — Z9181 History of falling: Secondary | ICD-10-CM | POA: Diagnosis not present

## 2015-06-27 DIAGNOSIS — I509 Heart failure, unspecified: Secondary | ICD-10-CM | POA: Diagnosis not present

## 2015-06-27 DIAGNOSIS — C772 Secondary and unspecified malignant neoplasm of intra-abdominal lymph nodes: Secondary | ICD-10-CM | POA: Diagnosis not present

## 2015-06-27 DIAGNOSIS — I110 Hypertensive heart disease with heart failure: Secondary | ICD-10-CM | POA: Diagnosis not present

## 2015-06-27 DIAGNOSIS — C189 Malignant neoplasm of colon, unspecified: Secondary | ICD-10-CM | POA: Diagnosis not present

## 2015-06-27 DIAGNOSIS — I2510 Atherosclerotic heart disease of native coronary artery without angina pectoris: Secondary | ICD-10-CM | POA: Diagnosis not present

## 2015-06-27 NOTE — Telephone Encounter (Signed)
Pt's daughter called and the form that was previously filled out is obsolete and the insurance company is faxing an additional form today which needs to be completed and faxed in.  Best number to call when completed is [phone]

## 2015-07-01 DIAGNOSIS — H52223 Regular astigmatism, bilateral: Secondary | ICD-10-CM | POA: Diagnosis not present

## 2015-07-01 DIAGNOSIS — H40023 Open angle with borderline findings, high risk, bilateral: Secondary | ICD-10-CM | POA: Diagnosis not present

## 2015-07-01 DIAGNOSIS — H40033 Anatomical narrow angle, bilateral: Secondary | ICD-10-CM | POA: Diagnosis not present

## 2015-07-01 DIAGNOSIS — H18413 Arcus senilis, bilateral: Secondary | ICD-10-CM | POA: Diagnosis not present

## 2015-07-01 DIAGNOSIS — H5201 Hypermetropia, right eye: Secondary | ICD-10-CM | POA: Diagnosis not present

## 2015-07-01 DIAGNOSIS — H11153 Pinguecula, bilateral: Secondary | ICD-10-CM | POA: Diagnosis not present

## 2015-07-01 DIAGNOSIS — H2513 Age-related nuclear cataract, bilateral: Secondary | ICD-10-CM | POA: Diagnosis not present

## 2015-07-01 DIAGNOSIS — H25813 Combined forms of age-related cataract, bilateral: Secondary | ICD-10-CM | POA: Diagnosis not present

## 2015-07-01 DIAGNOSIS — H25013 Cortical age-related cataract, bilateral: Secondary | ICD-10-CM | POA: Diagnosis not present

## 2015-07-01 NOTE — Telephone Encounter (Signed)
Will give to Shirlean Mylar to see if she can help me fill it out. Apparently I filled it out wrong last time.

## 2015-07-02 ENCOUNTER — Encounter: Payer: Self-pay | Admitting: *Deleted

## 2015-07-02 ENCOUNTER — Telehealth: Payer: Self-pay

## 2015-07-02 ENCOUNTER — Other Ambulatory Visit: Payer: Self-pay | Admitting: Internal Medicine

## 2015-07-02 ENCOUNTER — Inpatient Hospital Stay
Admission: EM | Admit: 2015-07-02 | Discharge: 2015-07-03 | DRG: 310 | Disposition: A | Discharge status: Home Health | Payer: Medicare Other | Attending: Internal Medicine | Admitting: Internal Medicine

## 2015-07-02 ENCOUNTER — Inpatient Hospital Stay: Payer: Medicare Other

## 2015-07-02 DIAGNOSIS — R0602 Shortness of breath: Secondary | ICD-10-CM | POA: Diagnosis not present

## 2015-07-02 DIAGNOSIS — Z8249 Family history of ischemic heart disease and other diseases of the circulatory system: Secondary | ICD-10-CM | POA: Diagnosis not present

## 2015-07-02 DIAGNOSIS — I959 Hypotension, unspecified: Secondary | ICD-10-CM | POA: Diagnosis not present

## 2015-07-02 DIAGNOSIS — C189 Malignant neoplasm of colon, unspecified: Secondary | ICD-10-CM | POA: Diagnosis not present

## 2015-07-02 DIAGNOSIS — Z85028 Personal history of other malignant neoplasm of stomach: Secondary | ICD-10-CM

## 2015-07-02 DIAGNOSIS — K219 Gastro-esophageal reflux disease without esophagitis: Secondary | ICD-10-CM | POA: Diagnosis not present

## 2015-07-02 DIAGNOSIS — I110 Hypertensive heart disease with heart failure: Secondary | ICD-10-CM | POA: Diagnosis not present

## 2015-07-02 DIAGNOSIS — Z96651 Presence of right artificial knee joint: Secondary | ICD-10-CM | POA: Diagnosis not present

## 2015-07-02 DIAGNOSIS — R634 Abnormal weight loss: Secondary | ICD-10-CM | POA: Diagnosis present

## 2015-07-02 DIAGNOSIS — R05 Cough: Secondary | ICD-10-CM | POA: Diagnosis present

## 2015-07-02 DIAGNOSIS — R002 Palpitations: Secondary | ICD-10-CM | POA: Diagnosis not present

## 2015-07-02 DIAGNOSIS — D649 Anemia, unspecified: Secondary | ICD-10-CM | POA: Diagnosis present

## 2015-07-02 DIAGNOSIS — Z886 Allergy status to analgesic agent status: Secondary | ICD-10-CM | POA: Diagnosis not present

## 2015-07-02 DIAGNOSIS — Z951 Presence of aortocoronary bypass graft: Secondary | ICD-10-CM

## 2015-07-02 DIAGNOSIS — Z85038 Personal history of other malignant neoplasm of large intestine: Secondary | ICD-10-CM | POA: Diagnosis not present

## 2015-07-02 DIAGNOSIS — R42 Dizziness and giddiness: Secondary | ICD-10-CM

## 2015-07-02 DIAGNOSIS — C772 Secondary and unspecified malignant neoplasm of intra-abdominal lymph nodes: Secondary | ICD-10-CM | POA: Diagnosis not present

## 2015-07-02 DIAGNOSIS — Z90710 Acquired absence of both cervix and uterus: Secondary | ICD-10-CM | POA: Diagnosis not present

## 2015-07-02 DIAGNOSIS — Z66 Do not resuscitate: Secondary | ICD-10-CM | POA: Diagnosis present

## 2015-07-02 DIAGNOSIS — I10 Essential (primary) hypertension: Secondary | ICD-10-CM | POA: Diagnosis not present

## 2015-07-02 DIAGNOSIS — Z86718 Personal history of other venous thrombosis and embolism: Secondary | ICD-10-CM | POA: Diagnosis not present

## 2015-07-02 DIAGNOSIS — Z800 Family history of malignant neoplasm of digestive organs: Secondary | ICD-10-CM | POA: Diagnosis not present

## 2015-07-02 DIAGNOSIS — Z888 Allergy status to other drugs, medicaments and biological substances status: Secondary | ICD-10-CM | POA: Diagnosis not present

## 2015-07-02 DIAGNOSIS — Z8261 Family history of arthritis: Secondary | ICD-10-CM | POA: Diagnosis not present

## 2015-07-02 DIAGNOSIS — Z86711 Personal history of pulmonary embolism: Secondary | ICD-10-CM | POA: Diagnosis not present

## 2015-07-02 DIAGNOSIS — I2510 Atherosclerotic heart disease of native coronary artery without angina pectoris: Secondary | ICD-10-CM | POA: Diagnosis present

## 2015-07-02 DIAGNOSIS — I509 Heart failure, unspecified: Secondary | ICD-10-CM | POA: Diagnosis not present

## 2015-07-02 DIAGNOSIS — R001 Bradycardia, unspecified: Secondary | ICD-10-CM | POA: Diagnosis not present

## 2015-07-02 DIAGNOSIS — E559 Vitamin D deficiency, unspecified: Secondary | ICD-10-CM | POA: Diagnosis present

## 2015-07-02 DIAGNOSIS — Z9181 History of falling: Secondary | ICD-10-CM | POA: Diagnosis not present

## 2015-07-02 DIAGNOSIS — R531 Weakness: Secondary | ICD-10-CM | POA: Diagnosis not present

## 2015-07-02 LAB — BASIC METABOLIC PANEL
ANION GAP: 3 — AB (ref 5–15)
BUN: 32 mg/dL — ABNORMAL HIGH (ref 6–20)
CALCIUM: 9.3 mg/dL (ref 8.9–10.3)
CO2: 32 mmol/L (ref 22–32)
CREATININE: 1.06 mg/dL — AB (ref 0.44–1.00)
Chloride: 105 mmol/L (ref 101–111)
GFR, EST AFRICAN AMERICAN: 54 mL/min — AB (ref >60)
GFR, EST NON AFRICAN AMERICAN: 47 mL/min — AB (ref >60)
Glucose, Bld: 95 mg/dL (ref 65–99)
Potassium: 4.4 mmol/L (ref 3.5–5.1)
SODIUM: 140 mmol/L (ref 135–145)

## 2015-07-02 LAB — URINALYSIS COMPLETE WITH MICROSCOPIC (ARMC ONLY)
BILIRUBIN URINE: NEGATIVE
Glucose, UA: NEGATIVE mg/dL
Hgb urine dipstick: NEGATIVE
KETONES UR: NEGATIVE mg/dL
Leukocytes, UA: NEGATIVE
NITRITE: NEGATIVE
PROTEIN: NEGATIVE mg/dL
SPECIFIC GRAVITY, URINE: 1.016 (ref 1.005–1.030)
pH: 5 (ref 5.0–8.0)

## 2015-07-02 LAB — CBC
HCT: 32.8 % — ABNORMAL LOW (ref 35.0–47.0)
HEMOGLOBIN: 10.9 g/dL — AB (ref 12.0–16.0)
MCH: 30.7 pg (ref 26.0–34.0)
MCHC: 33.2 g/dL (ref 32.0–36.0)
MCV: 92.7 fL (ref 80.0–100.0)
PLATELETS: 193 10*3/uL (ref 150–440)
RBC: 3.54 MIL/uL — AB (ref 3.80–5.20)
RDW: 15.9 % — ABNORMAL HIGH (ref 11.5–14.5)
WBC: 4.7 10*3/uL (ref 3.6–11.0)

## 2015-07-02 LAB — TROPONIN I: Troponin I: <0.03 ng/mL (ref <0.031)

## 2015-07-02 LAB — MAGNESIUM: Magnesium: 2.2 mg/dL (ref 1.7–2.4)

## 2015-07-02 MED ORDER — SODIUM CHLORIDE 0.9 % IJ SOLN
3.0000 mL | Freq: Two times a day (BID) | INTRAMUSCULAR | Status: DC
  Administered 2015-07-03 (×2): 3 mL via INTRAVENOUS

## 2015-07-02 MED ORDER — ACETAMINOPHEN 650 MG RE SUPP: 650.0000 mg | Freq: Four times a day (QID) | RECTAL | Status: DC | PRN

## 2015-07-02 MED ORDER — ENOXAPARIN SODIUM 40 MG/0.4ML ~~LOC~~ SOLN
40.0000 mg | Freq: Every day | SUBCUTANEOUS | Status: DC
  Administered 2015-07-03: 40 mg via SUBCUTANEOUS
  Filled 2015-07-02: qty 0.4

## 2015-07-02 MED ORDER — ACETAMINOPHEN 325 MG PO TABS: 650.0000 mg | ORAL_TABLET | Freq: Four times a day (QID) | ORAL | Status: DC | PRN

## 2015-07-02 NOTE — ED Provider Notes (Signed)
[facility] Emergency Department Provider Note  ____________________________________________  Time seen: Approximately 9:11 PM  I have reviewed the triage vital signs and the nursing notes.   HISTORY  Chief Complaint Palpitations    HPI Opaline Mittag is a 82 y.o. female is a history of HTN on a beta blocker,CAD, CHF, DVT and PE not currently anticoagulated presenting with bradycardia and DOE. Patient reports that she was in her usual state of health when she had an interaction both with her home health care worker and her physical therapist noting that she was bradycardic and possibly hypotensive (although stated blood pressure by daughter was 120/45). The patient states that she occasionally develops lightheadedness with ambulation which has been worse over the last week. She otherwise denies any shortness of breath more than her chronic shortness of breath, no chest pain, palpitations. No recent changes in her beta blocker.   Past Medical History  Diagnosis Date  . Colon cancer ([place])   . Coronary artery disease   . Hypertension   . Blood clot associated with vein wall inflammation   . Anemia   . DVT (deep venous thrombosis) ([place])   . PE (pulmonary embolism)   . Hyperkalemia   . GERD (gastroesophageal reflux disease)   . Vitamin D deficiency   . Renal disorder   . Gastrointestinal hemorrhage   . Malignant neoplasm of stomach ([place])   . CHF (congestive heart failure) ([place])   . Renal insufficiency   . Chicken pox   . Frequent headaches     Patient Active Problem List   Diagnosis Date Noted  . Chronic congestive heart failure ([place]) 06/12/2015  . Essential hypertension 06/12/2015  . Vitamin D deficiency 06/12/2015  . Coronary artery disease involving coronary bypass graft of native heart without angina pectoris 06/12/2015  . History of DVT 06/12/2015  . History of pulmonary embolus (PE) 06/12/2015  . GERD (gastroesophageal reflux disease) 06/12/2015   . Colon cancer metastasized to intra-abdominal lymph node ([place]) 06/02/2015    Past Surgical History  Procedure Laterality Date  . Ivc filter placement ([facility] hx)    . Colon surgery    . Abdominal hysterectomy    . Joint replacement Right     knee    Current Outpatient Rx  Name  Route  Sig  Dispense  Refill  . acetaminophen (TYLENOL) 650 MG CR tablet   Oral   Take 650 mg by mouth 3 (three) times daily with meals.         Marland Kitchen aspirin (ASPIRIN EC) 81 MG EC tablet   Oral   Take 81 mg by mouth daily. Swallow whole.         . carvedilol (COREG) 6.25 MG tablet   Oral   Take 6.25 mg by mouth 2 (two) times daily with a meal.         . Cholecalciferol (VITAMIN D-3) 1000 UNITS CAPS   Oral   Take 2 capsules by mouth daily.         . furosemide (LASIX) 40 MG tablet   Oral   Take 40 mg by mouth 2 (two) times daily.         . Magnesium Oxide 500 MG TABS   Oral   Take 1 tablet by mouth 2 (two) times daily.         . meclizine (ANTIVERT) 25 MG tablet   Oral   Take 1 tablet (25 mg total) by mouth 3 (three) times daily as needed for  dizziness.   30 tablet   0   . oxyCODONE-acetaminophen (ROXICET) 5-325 MG tablet   Oral   Take 1 tablet by mouth every 12 (twelve) hours as needed for moderate pain.   60 tablet   0   . pantoprazole (PROTONIX) 40 MG tablet   Oral   Take 1 tablet (40 mg total) by mouth 2 (two) times daily.   60 tablet   0   . spironolactone (ALDACTONE) 25 MG tablet   Oral   Take 25 mg by mouth 2 (two) times daily.         . temazepam (RESTORIL) 15 MG capsule   Oral   Take 15 mg by mouth at bedtime as needed for sleep.         . vitamin C (ASCORBIC ACID) 500 MG tablet   Oral   Take 500 mg by mouth daily.           Allergies Nsaids; Reglan; Statins; and Torsemide  Family History  Problem Relation Age of Onset  . Arthritis Mother   . Hypertension Mother   . Colon cancer Sister   . Heart disease Daughter   . Colon cancer Son      Social History Social History  Substance Use Topics  . Smoking status: Never Smoker   . Smokeless tobacco: None  . Alcohol Use: No    Review of Systems Constitutional: No fever/chills. No syncope. Positive lightheadedness with exertion. Eyes: No visual changes. ENT: No sore throat. Cardiovascular: Denies chest pain, palpitations. Respiratory: Denies shortness of breath.  No cough. Gastrointestinal: No abdominal pain.  No nausea, no vomiting.  No diarrhea.  No constipation. Genitourinary: Negative for dysuria. Musculoskeletal: Negative for back pain. Skin: Negative for rash. Neurological: Negative for headaches, focal weakness or numbness.  10-point ROS otherwise negative.  ____________________________________________   PHYSICAL EXAM:  VITAL SIGNS: ED Triage Vitals  Enc Vitals Group     BP 07/02/15 1724 113/48 mmHg     Pulse Rate 07/02/15 1724 48     Resp 07/02/15 1724 18     Temp 07/02/15 1724 98.8 F (37.1 C)     Temp Source 07/02/15 1724 Oral     SpO2 07/02/15 1724 98 %     Weight 07/02/15 1724 169 lb (76.658 kg)     Height 07/02/15 1724 5\' 8"  (1.727 m)     Head Cir --      Peak Flow --      Pain Score --      Pain Loc --      Pain Edu? --      Excl. in [place]? --     Constitutional: Alert and oriented. Well appearing and in no acute distress. Answer question appropriately. Eyes: Conjunctivae are normal.  EOMI. Head: Atraumatic. Nose: No congestion/rhinnorhea. Mouth/Throat: Mucous membranes are moist.  Neck: No stridor.  Supple.   Cardiovascular: Normal rate, regular rhythm. No murmurs, rubs or gallops.  Respiratory: Normal respiratory effort.  No retractions. Lungs CTAB.  No wheezes, rales or ronchi. Gastrointestinal: Soft and nontender. No distention. No peritoneal signs. Musculoskeletal: Positive symmetric bilateral nonpitting lower extremity edema. Neurologic:  Normal speech and language. No gross focal neurologic deficits are appreciated.  Skin:   Skin is warm, dry and intact. No rash noted. Psychiatric: Mood and affect are normal. Speech and behavior are normal.  Normal judgement.  ____________________________________________   LABS (all labs ordered are listed, but only abnormal results are displayed)  Labs Reviewed  BASIC METABOLIC  PANEL - Abnormal; Notable for the following:    BUN 32 (*)    Creatinine, Ser 1.06 (*)    GFR calc non Af Amer 47 (*)    GFR calc Af Amer 54 (*)    Anion gap 3 (*)    All other components within normal limits  CBC - Abnormal; Notable for the following:    RBC 3.54 (*)    Hemoglobin 10.9 (*)    HCT 32.8 (*)    RDW 15.9 (*)    All other components within normal limits  URINALYSIS COMPLETEWITH MICROSCOPIC ([facility] ONLY)  TROPONIN I  MAGNESIUM  CBG MONITORING, ED   ____________________________________________  EKG  ED ECG REPORT I, Eula Listen, the attending physician, personally viewed and interpreted this ECG.   Date: 07/02/2015  EKG Time: 1838  Rate: 43  Rhythm: sinus bradycardia  Axis: Leftward  Intervals:none  ST&T Change: No ST elevation.  ____________________________________________  RADIOLOGY  No results found.  ____________________________________________   PROCEDURES  Procedure(s) performed: None  Critical Care performed: No ____________________________________________   INITIAL IMPRESSION / ASSESSMENT AND PLAN / ED COURSE  Pertinent labs & imaging results that were available during my care of the patient were reviewed by me and considered in my medical decision making (see chart for details).  82 y.o. with a history of CAD, CHF on carvedilol all presenting with symptomatic bradycardia. It is possible that her symptoms are due to her medication. I will also evaluate her for ACS or MI. I will plan to admit her to the hospital for further evaluation.  ----------------------------------------- 9:49 PM on  07/02/2015 -----------------------------------------  The patient has symptomatic bradycardia. Her labs are reassuring with a negative troponin, and normal electrolytes. Her magnesium is still pending. I will plan admission to the hospital for further treatment and evaluation.  ____________________________________________  FINAL CLINICAL IMPRESSION(S) / ED DIAGNOSES  Final diagnoses:  Symptomatic bradycardia  Lightheaded      NEW MEDICATIONS STARTED DURING THIS VISIT:  New Prescriptions   No medications on file     Eula Listen, MD 07/02/15 2149

## 2015-07-02 NOTE — ED Notes (Signed)
States when home health checked on her today they noticed her HR was irregular and low, also states low BP, pt awake and alert

## 2015-07-02 NOTE — Telephone Encounter (Signed)
Left message asking [facility] to call back about form dma [id number]

## 2015-07-02 NOTE — Telephone Encounter (Signed)
PLEASE NOTE: All timestamps contained within this report are represented as [place] Standard Time. CONFIDENTIALTY NOTICE: This fax transmission is intended only for the addressee. It contains information that is legally privileged, confidential or otherwise protected from use or disclosure. If you are not the intended recipient, you are strictly prohibited from reviewing, disclosing, copying using or disseminating any of this information or taking any action in reliance on or regarding this information. If you have received this fax in error, please notify us immediately by telephone so that we can arrange for its return to [place]. Phone: [phone], Toll-Free: [phone], Fax: [phone] Page: 1 of 2 Call Id: [id number] [place] Patient Name: Jill Bowers Gender: Female DOB: [birth year] Age: 82 Y 3 M 3 D Return Phone Number: [phone] (Primary), [phone] (Secondary) Address: City/State/Zip: [place] Client [place] Night - Client Client Site [place] Physician Webb Silversmith Contact Type Call Call Type Triage / Clinical Caller Name [name] Relationship To Patient Daughter Return Phone Number [phone] (Primary) Chief Complaint Blood Pressure Low Initial Comment Caller states her mother's vitals are low. Her pulse is running 48 and BP 120/45. PreDisposition Did not know what to do Nurse Assessment Nurse: Harlow Mares, RN, Suanne Marker Date/Time Eilene Ghazi Time): 07/02/2015 4:13:02 PM Confirm and document reason for call. If symptomatic, describe symptoms. ---Caller states her mother's vitals are low. Her pulse is running 48 and BP 120/45. Reports that BP goes up and down. The pulse is at a slower rate today and this is new. Reports that she has home PT and the nurse noted today an irregular heartbeat. PT for strengthening. Has the patient  traveled out of the country within the last 30 days? ---No Does the patient have any new or worsening symptoms? ---Yes Will a triage be completed? ---Yes Related visit to physician within the last 2 weeks? ---No Does the PT have any chronic conditions? (i.e. diabetes, asthma, etc.) ---Yes List chronic conditions. ---hypertension, stage 3 colon ca, hrt disease with MI history, filter for blood clots in her leg and lungs Is this a behavioral health or substance abuse call? ---No Guidelines Guideline Title Affirmed Question Affirmed Notes Nurse Date/Time Eilene Ghazi Time) Heart Rate and Heartbeat Questions Heart beating very slowly (e.g., < 50 / minute) (Exception: athlete) Harlow Mares, RN, Rhonda 07/02/2015 4:16:49 PM Disp. Time Eilene Ghazi Time) Disposition Final User 07/02/2015 4:19:48 PM Go to ED Now Yes Harlow Mares, RN, Suanne Marker PLEASE NOTE: All timestamps contained within this report are represented as [place] Standard Time. CONFIDENTIALTY NOTICE: This fax transmission is intended only for the addressee. It contains information that is legally privileged, confidential or otherwise protected from use or disclosure. If you are not the intended recipient, you are strictly prohibited from reviewing, disclosing, copying using or disseminating any of this information or taking any action in reliance on or regarding this information. If you have received this fax in error, please notify us immediately by telephone so that we can arrange for its return to [place]. Phone: [phone], Toll-Free: [phone], Fax: [phone] Page: 2 of 2 Call Id: [id number] Caller Understands: Yes Disagree/Comply: Comply Care Advice Given Per Guideline GO TO ED NOW: You need to be seen in the Emergency Department. Go to the ER at ___________ [place] now. Drive carefully. NOTE TO TRIAGER - DRIVING: * Another adult should drive. BRING MEDICINES: * Please bring a list of your current medicines when you go to  the Emergency  Department (ER). * It is also a good idea to bring the pill bottles too. This will help the doctor to make certain you are taking the right medicines and the right dose. CARE ADVICE given per Palpitations (Adult) guideline. After Care Instructions Given Call Event Type User Date / Time Description Referrals [facility] - ED

## 2015-07-02 NOTE — Progress Notes (Signed)
Patient arrived to 2A Room 251. A&Ox4, VS: BP 117/61, HR 54, T 98.1 orally, RR 18, SpO2 100% RA. Patient denies pain and all questions answered. Patient oriented to unit and Fall Safety Plan signed. Skin assessment completed with Vincente [facility] RN and skin intact. Nursing staff will continue to monitor. Earleen Reaper, RN

## 2015-07-02 NOTE — H&P (Signed)
[place] at [place]    MR#:  [id number]  DATE OF BIRTH:  1929-03-13  DATE OF ADMISSION:  07/02/2015  PRIMARY CARE PHYSICIAN: Webb Silversmith, NP   REQUESTING/REFERRING PHYSICIAN: Aundria Rud  CHIEF COMPLAINT:   Chief Complaint  Patient presents with  . Palpitations    HISTORY OF PRESENT ILLNESS:  Jill Bowers  is a 82 y.o. female sent in for low heart rate. Patient has some shortness of breath and some fatigue. Her home care nurse came in and noted that her pulse was erratic and low. The physical therapist came in and the pulse was in the 40s and never went above 50 even when she was working with the physical therapist. Blood pressure was a little bit low. They called the PMD and the triage nurse told him to come to the ER. No chest pain. Some shortness of breath and dry cough. Today wasn't walking around as much with physical therapy as she normally does. Her last dose of Coreg was at 8 AM.  PAST MEDICAL HISTORY:   Past Medical History  Diagnosis Date  . Colon cancer ([place])   . Coronary artery disease   . Hypertension   . Blood clot associated with vein wall inflammation   . Anemia   . DVT (deep venous thrombosis) ([place])   . PE (pulmonary embolism)   . Hyperkalemia   . GERD (gastroesophageal reflux disease)   . Vitamin D deficiency   . Renal disorder   . Gastrointestinal hemorrhage   . Malignant neoplasm of stomach ([place])   . CHF (congestive heart failure) ([place])   . Renal insufficiency   . Chicken pox   . Frequent headaches     PAST SURGICAL HISTORY:   Past Surgical History  Procedure Laterality Date  . Ivc filter placement ([facility] hx)    . Colon surgery    . Abdominal hysterectomy    . Joint replacement Right     knee    SOCIAL HISTORY:   Social History  Substance Use Topics  . Smoking status: Never Smoker   . Smokeless tobacco: Not on file  . Alcohol Use: No    FAMILY HISTORY:    Family History  Problem Relation Age of Onset  . Arthritis Mother   . Hypertension Mother   . Colon cancer Sister   . Heart disease Daughter   . Colon cancer Son     DRUG ALLERGIES:   Allergies  Allergen Reactions  . Nsaids Other (See Comments)  . Reglan [Metoclopramide] Other (See Comments)  . Statins Other (See Comments)  . Torsemide Other (See Comments)    REVIEW OF SYSTEMS:  CONSTITUTIONAL: No fever, positive for cold feeling, positive for fatigue. Positive for 50 pound weight loss since her colon cancer surgery. EYES: No blurred or double vision. Wears glasses EARS, NOSE, AND THROAT: No tinnitus or ear pain. No sore throat. Positive for runny nose RESPIRATORY: Positive for cough which is dry, positive for shortness of breath, no wheezing or hemoptysis.  CARDIOVASCULAR: Occasional chest pain, positive for edema.  GASTROINTESTINAL: No nausea, vomiting, diarrhea or abdominal pain. No blood in bowel movements. Positive for constipation GENITOURINARY: No dysuria, hematuria.  ENDOCRINE: No polyuria, nocturia,  HEMATOLOGY: No anemia, easy bruising or bleeding SKIN: No rash or lesion. MUSCULOSKELETAL: Positive for joint pain in the knees NEUROLOGIC: Positive for tingling and numbness in bilateral feet.  PSYCHIATRY: No anxiety or depression.   MEDICATIONS AT  HOME:   Prior to Admission medications   Medication Sig Start Date End Date Taking? Authorizing Provider  acetaminophen (TYLENOL) 650 MG CR tablet Take 650 mg by mouth 3 (three) times daily with meals.   Yes Historical Provider, MD  aspirin (ASPIRIN EC) 81 MG EC tablet Take 81 mg by mouth daily. Swallow whole.   Yes Historical Provider, MD  carvedilol (COREG) 6.25 MG tablet Take 6.25 mg by mouth 2 (two) times daily with a meal.   Yes Historical Provider, MD  Cholecalciferol (VITAMIN D-3) 1000 UNITS CAPS Take 2 capsules by mouth daily.   Yes Historical Provider, MD  furosemide (LASIX) 40 MG tablet Take 40 mg by mouth 2  (two) times daily.   Yes Historical Provider, MD  Magnesium Oxide 500 MG TABS Take 1 tablet by mouth 2 (two) times daily.   Yes Historical Provider, MD  meclizine (ANTIVERT) 25 MG tablet Take 1 tablet (25 mg total) by mouth 3 (three) times daily as needed for dizziness. 05/19/15  Yes Cammie Sickle, MD  oxyCODONE-acetaminophen (ROXICET) 5-325 MG tablet Take 1 tablet by mouth every 12 (twelve) hours as needed for moderate pain. 06/12/15  Yes Jearld Fenton, NP  pantoprazole (PROTONIX) 40 MG tablet Take 1 tablet (40 mg total) by mouth 2 (two) times daily. 05/30/15  Yes Vaughan Basta, MD  spironolactone (ALDACTONE) 25 MG tablet Take 25 mg by mouth 2 (two) times daily.   Yes Historical Provider, MD  temazepam (RESTORIL) 15 MG capsule Take 15 mg by mouth at bedtime as needed for sleep.   Yes Historical Provider, MD  vitamin C (ASCORBIC ACID) 500 MG tablet Take 500 mg by mouth daily.   Yes Historical Provider, MD      VITAL SIGNS:  Blood pressure 119/46, pulse 41, temperature 98.8 F (37.1 C), temperature source Oral, resp. rate 20, height 5\' 8"  (1.727 m), weight 76.658 kg (169 lb), SpO2 100 %.  PHYSICAL EXAMINATION:  GENERAL:  82 y.o.-year-old patient lying in the bed with no acute distress.  EYES: Pupils equal, round, reactive to light and accommodation. No scleral icterus. Extraocular muscles intact.  HEENT: Head atraumatic, normocephalic. Oropharynx and nasopharynx clear.  NECK:  Supple, no jugular venous distention. No thyroid enlargement, no tenderness. Positive bruit right neck LUNGS: Normal breath sounds bilaterally, no wheezing, rales,rhonchi or crepitation. No use of accessory muscles of respiration.  CARDIOVASCULAR: S1, S2 bradycardia. No murmurs, rubs, or gallops.  ABDOMEN: Soft, nontender, nondistended. Bowel sounds present. No organomegaly or mass.  EXTREMITIES: 3+ edema, no cyanosis, or clubbing.  NEUROLOGIC: Cranial nerves II through XII are intact. Muscle strength 5/5  in all extremities. Sensation intact. Gait not checked.  PSYCHIATRIC: The patient is alert and oriented x 3.  SKIN: No rash, lesion, or ulcer.   LABORATORY PANEL:   CBC  Recent Labs Lab 07/02/15 1742  WBC 4.7  HGB 10.9*  HCT 32.8*  PLT 193   ------------------------------------------------------------------------------------------------------------------  Chemistries   Recent Labs Lab 07/02/15 1742  NA 140  K 4.4  CL 105  CO2 32  GLUCOSE 95  BUN 32*  CREATININE 1.06*  CALCIUM 9.3  MG 2.2   ------------------------------------------------------------------------------------------------------------------  Cardiac Enzymes  Recent Labs Lab 07/02/15 1742  TROPONINI <0.03   ------------------------------------------------------------------------------------------------------------------  RADIOLOGY:  No results found.  EKG:  Sinus bradycardia, LVH  IMPRESSION AND PLAN:   1. Symptomatic bradycardia. Admit as observation. Hold Coreg. Monitor on telemetry. Serial cardiac enzymes. 2. Shortness of breath- wondering if this is from the bradycardia.  Lungs sound clear to exam. Obtain a chest x-ray. 3. Essential hypertension- continue other medications 4. History of DVT and PE- status post IVC filter in the past 5. History of colon cancer and weight loss 6. Weakness physical therapy evaluation 7. Chronic anemia All the records are reviewed and case discussed with ED provider. Management plans discussed with the patient, family and they are in agreement.  CODE STATUS: DO NOT RESUSCITATE  TOTAL TIME TAKING CARE OF THIS PATIENT: 50 minutes.    Loletha Grayer M.D on 07/02/2015 at 10:34 PM  Between 7am to 6pm - Pager - [phone]  After 6pm call admission pager [facility]  Office  [phone]  CC: Primary care physician; Webb Silversmith, NP

## 2015-07-02 NOTE — Telephone Encounter (Signed)
Per chart review pt is at [facility] ED. 

## 2015-07-03 DIAGNOSIS — I2510 Atherosclerotic heart disease of native coronary artery without angina pectoris: Secondary | ICD-10-CM | POA: Diagnosis not present

## 2015-07-03 DIAGNOSIS — I110 Hypertensive heart disease with heart failure: Secondary | ICD-10-CM | POA: Diagnosis not present

## 2015-07-03 DIAGNOSIS — E559 Vitamin D deficiency, unspecified: Secondary | ICD-10-CM | POA: Diagnosis not present

## 2015-07-03 DIAGNOSIS — R001 Bradycardia, unspecified: Secondary | ICD-10-CM | POA: Diagnosis present

## 2015-07-03 DIAGNOSIS — R0602 Shortness of breath: Secondary | ICD-10-CM | POA: Diagnosis not present

## 2015-07-03 DIAGNOSIS — I509 Heart failure, unspecified: Secondary | ICD-10-CM | POA: Diagnosis not present

## 2015-07-03 DIAGNOSIS — D649 Anemia, unspecified: Secondary | ICD-10-CM | POA: Diagnosis not present

## 2015-07-03 DIAGNOSIS — I10 Essential (primary) hypertension: Secondary | ICD-10-CM | POA: Diagnosis not present

## 2015-07-03 DIAGNOSIS — R531 Weakness: Secondary | ICD-10-CM | POA: Diagnosis not present

## 2015-07-03 LAB — CBC
HCT: 31.3 % — ABNORMAL LOW (ref 35.0–47.0)
HEMOGLOBIN: 10.3 g/dL — AB (ref 12.0–16.0)
MCH: 30.7 pg (ref 26.0–34.0)
MCHC: 32.7 g/dL (ref 32.0–36.0)
MCV: 93.8 fL (ref 80.0–100.0)
PLATELETS: 185 10*3/uL (ref 150–440)
RBC: 3.34 MIL/uL — AB (ref 3.80–5.20)
RDW: 15.8 % — ABNORMAL HIGH (ref 11.5–14.5)
WBC: 4.5 10*3/uL (ref 3.6–11.0)

## 2015-07-03 LAB — TROPONIN I

## 2015-07-03 LAB — BASIC METABOLIC PANEL
ANION GAP: 5 (ref 5–15)
BUN: 30 mg/dL — ABNORMAL HIGH (ref 6–20)
CALCIUM: 9.3 mg/dL (ref 8.9–10.3)
CO2: 30 mmol/L (ref 22–32)
CREATININE: 1.03 mg/dL — AB (ref 0.44–1.00)
Chloride: 107 mmol/L (ref 101–111)
GFR, EST AFRICAN AMERICAN: 56 mL/min — AB (ref >60)
GFR, EST NON AFRICAN AMERICAN: 49 mL/min — AB (ref >60)
GLUCOSE: 121 mg/dL — AB (ref 65–99)
Potassium: 4.7 mmol/L (ref 3.5–5.1)
Sodium: 142 mmol/L (ref 135–145)

## 2015-07-03 MED ORDER — TEMAZEPAM 15 MG PO CAPS: 15.0000 mg | ORAL_CAPSULE | Freq: Every evening | ORAL | Status: DC | PRN

## 2015-07-03 MED ORDER — PANTOPRAZOLE SODIUM 40 MG PO TBEC
40.0000 mg | DELAYED_RELEASE_TABLET | Freq: Two times a day (BID) | ORAL | Status: DC
Start: 2015-07-03 — End: 2015-07-03
  Administered 2015-07-03: 40 mg via ORAL
  Filled 2015-07-03: qty 1

## 2015-07-03 MED ORDER — VITAMIN D 1000 UNITS PO TABS
2000.0000 [IU] | ORAL_TABLET | Freq: Every day | ORAL | Status: DC
  Administered 2015-07-03: 2000 [IU] via ORAL
  Filled 2015-07-03: qty 2

## 2015-07-03 MED ORDER — SPIRONOLACTONE 25 MG PO TABS
25.0000 mg | ORAL_TABLET | Freq: Two times a day (BID) | ORAL | Status: DC
  Administered 2015-07-03: 25 mg via ORAL
  Filled 2015-07-03: qty 1

## 2015-07-03 MED ORDER — MAGNESIUM OXIDE 400 (241.3 MG) MG PO TABS
400.0000 mg | ORAL_TABLET | Freq: Two times a day (BID) | ORAL | Status: DC
  Administered 2015-07-03 (×2): 400 mg via ORAL
  Filled 2015-07-03 (×2): qty 1

## 2015-07-03 MED ORDER — ASPIRIN EC 81 MG PO TBEC
81.0000 mg | DELAYED_RELEASE_TABLET | Freq: Every day | ORAL | Status: DC
  Administered 2015-07-03: 81 mg via ORAL
  Filled 2015-07-03: qty 1

## 2015-07-03 MED ORDER — VITAMIN C 500 MG PO TABS
500.0000 mg | ORAL_TABLET | Freq: Every day | ORAL | Status: DC
  Administered 2015-07-03: 500 mg via ORAL
  Filled 2015-07-03: qty 1

## 2015-07-03 MED ORDER — OXYCODONE-ACETAMINOPHEN 5-325 MG PO TABS: 1.0000 | ORAL_TABLET | Freq: Two times a day (BID) | ORAL | Status: DC | PRN

## 2015-07-03 MED ORDER — MECLIZINE HCL 25 MG PO TABS: 25.0000 mg | ORAL_TABLET | Freq: Three times a day (TID) | ORAL | Status: DC | PRN

## 2015-07-03 MED ORDER — FUROSEMIDE 40 MG PO TABS
40.0000 mg | ORAL_TABLET | Freq: Two times a day (BID) | ORAL | Status: DC
  Administered 2015-07-03: 40 mg via ORAL
  Filled 2015-07-03: qty 1

## 2015-07-03 NOTE — Evaluation (Addendum)
Physical Therapy Evaluation Patient Details Name: Jill Bowers MRN: [id number] DOB: 20-Sep-1928 Today's Date: 07/03/2015   History of Present Illness  Pt admitted for complaints of palpitations and low HR noticed by HHPT and RN. Pt with history of IVC filter and currently lives with her daughter  Clinical Impression  Pt is a pleasant 82 year old female who was admitted for complaints of palpitations. Pt performs transfers and ambulation with cga and rw. At rest, pt with HR at 49 and increased to 65. RN notified. O2 sats remained WNL during examination while on room air. Pt demonstrates slightly slow gait pattern, however demonstrates safe technique. Pt demonstrates deficits with strength/balance. Would benefit from skilled PT to address above deficits and promote optimal return to PLOF.      Follow Up Recommendations Home health PT    Equipment Recommendations  None recommended by PT    Recommendations for Other Services       Precautions / Restrictions Precautions Precautions: None Restrictions Weight Bearing Restrictions: No      Mobility  Bed Mobility               General bed mobility comments: not performed as pt in recliner  Transfers Overall transfer level: Needs assistance Equipment used: Rolling walker (2 wheeled) Transfers: Sit to/from Stand Sit to Stand: Min guard         General transfer comment: safe technqiue performed with rw. Pt able to push from seated surface  Ambulation/Gait Ambulation/Gait assistance: Min guard Ambulation Distance (Feet): 200 Feet Assistive device: Rolling walker (2 wheeled) Gait Pattern/deviations: Step-through pattern     General Gait Details: ambulated using rw and safe technique, no LOB noted. Pt able to complete 10' walk test in 8 seconds  Stairs            Wheelchair Mobility    Modified Rankin (Stroke Patients Only)       Balance Overall balance assessment: Modified Independent                                            Pertinent Vitals/Pain Pain Assessment: No/denies pain    Home Living Family/patient expects to be discharged to:: Private residence Living Arrangements: Children Available Help at Discharge: Family;Available 24 hours/day Type of Home: House Home Access: Level entry     Home Layout: One level Home Equipment: Environmental consultant - 4 wheels (Counsellor)      Prior Function Level of Independence: Independent with assistive device(s)               Hand Dominance        Extremity/Trunk Assessment   Upper Extremity Assessment: Overall WFL for tasks assessed           Lower Extremity Assessment: Generalized weakness (grossly 4/5)         Communication   Communication: No difficulties  Cognition Arousal/Alertness: Awake/alert Behavior During Therapy: WFL for tasks assessed/performed Overall Cognitive Status: Within Functional Limits for tasks assessed                      General Comments      Exercises Other Exercises Other Exercises: Pt assisted to [facility] with cga. Safe technique performed with pt able to perform hygiene with supervision.       Assessment/Plan    PT Assessment Patient needs continued PT services  PT Diagnosis  Difficulty walking;Generalized weakness   PT Problem List Decreased strength;Decreased balance  PT Treatment Interventions Gait training;Therapeutic exercise   PT Goals (Current goals can be found in the Care Plan section) Acute Rehab PT Goals Patient Stated Goal: to go home PT Goal Formulation: With patient Time For Goal Achievement: 07/17/15 Potential to Achieve Goals: Good Additional Goals Additional Goal #1: Pt will be able to perform bed mobility/transfers with independence in order to improve functional independence    Frequency Min 2X/week   Barriers to discharge        Co-evaluation               End of Session Equipment Utilized During Treatment: Gait belt Activity  Tolerance: Patient tolerated treatment well Patient left: in chair;with chair alarm set Nurse Communication: Mobility status    Functional Assessment Tool Used: 10' walk test Functional Limitation: Mobility: Walking and moving around Mobility: Walking and Moving Around Current Status [id number]): At least 1 percent but less than 20 percent impaired, limited or restricted Mobility: Walking and Moving Around Goal Status [phone]): 0 percent impaired, limited or restricted    Time: 1101-1126 PT Time Calculation (min) (ACUTE ONLY): 25 min   Charges:   PT Evaluation $PT Eval Low Complexity: 1 Procedure PT Treatments $Therapeutic Activity: 8-22 mins   PT G Codes:   PT G-Codes **NOT FOR INPATIENT CLASS** Functional Assessment Tool Used: 10' walk test Functional Limitation: Mobility: Walking and moving around Mobility: Walking and Moving Around Current Status [id number]): At least 1 percent but less than 20 percent impaired, limited or restricted Mobility: Walking and Moving Around Goal Status [phone]): 0 percent impaired, limited or restricted    Sameer Teeple 07/03/2015, 1:14 PM  Greggory Stallion, PT, DPT [phone]

## 2015-07-03 NOTE — Discharge Summary (Signed)
[facility] [place]    MR#:  [id number]  DATE OF BIRTH:  05-Feb-1929  DATE OF ADMISSION:  07/02/2015 ADMITTING PHYSICIAN: Loletha Grayer, MD  DATE OF DISCHARGE: 07/03/2015 PRIMARY CARE PHYSICIAN: Webb Silversmith, NP    ADMISSION DIAGNOSIS:  Shortness of breath [R06.02] Lightheaded [R42] Symptomatic bradycardia [R00.1]  DISCHARGE DIAGNOSIS:  Active Problems:   Symptomatic bradycardia   Bradycardia   SECONDARY DIAGNOSIS:   Past Medical History  Diagnosis Date  . Colon cancer ([place])   . Coronary artery disease   . Hypertension   . Blood clot associated with vein wall inflammation   . Anemia   . DVT (deep venous thrombosis) ([place])   . PE (pulmonary embolism)   . Hyperkalemia   . GERD (gastroesophageal reflux disease)   . Vitamin D deficiency   . Renal disorder   . Gastrointestinal hemorrhage   . Malignant neoplasm of stomach ([place])   . CHF (congestive heart failure) ([place])   . Renal insufficiency   . Chicken pox   . Frequent headaches     HOSPITAL COURSE:  1. Symptomatic bradycardia.  Resolved by holding  Coreg. Monitored pt  on telemetry.  Ruled out AMI with neg  cardiac enzymes. Pt prefers op cardiology f/u, ive d/w cardiology, dr.Golan , will give her op f/u with appointment in 3-5 days 2. Shortness of breath- pt said its chronic and she's at her baseline. Lungs sound clear to exam. No acute findings on  chest x-ray. 3. Essential hypertension- continue  Medications other than Coreg 4. History of DVT and PE- status post IVC filter in the past 5. History of colon cancer and weight loss 6. Weakness physical therapy evaluation 7. Chronic anemia   DISCHARGE CONDITIONS:  fair  CONSULTS OBTAINED:  Treatment Team:  Loletha Grayer, MD   PROCEDURES  none  DRUG ALLERGIES:   Allergies  Allergen Reactions  . Nsaids Other (See Comments)  . Reglan [Metoclopramide] Other (See Comments)  . Statins  Other (See Comments)  . Torsemide Other (See Comments)    DISCHARGE MEDICATIONS:   Current Discharge Medication List    CONTINUE these medications which have NOT CHANGED   Details  acetaminophen (TYLENOL) 650 MG CR tablet Take 650 mg by mouth 3 (three) times daily with meals.    aspirin (ASPIRIN EC) 81 MG EC tablet Take 81 mg by mouth daily. Swallow whole.    Cholecalciferol (VITAMIN D-3) 1000 UNITS CAPS Take 2 capsules by mouth daily.    furosemide (LASIX) 40 MG tablet Take 40 mg by mouth 2 (two) times daily.    Magnesium Oxide 500 MG TABS Take 1 tablet by mouth 2 (two) times daily.    meclizine (ANTIVERT) 25 MG tablet Take 1 tablet (25 mg total) by mouth 3 (three) times daily as needed for dizziness. Qty: 30 tablet, Refills: 0    oxyCODONE-acetaminophen (ROXICET) 5-325 MG tablet Take 1 tablet by mouth every 12 (twelve) hours as needed for moderate pain. Qty: 60 tablet, Refills: 0    pantoprazole (PROTONIX) 40 MG tablet Take 1 tablet (40 mg total) by mouth 2 (two) times daily. Qty: 60 tablet, Refills: 0    spironolactone (ALDACTONE) 25 MG tablet Take 25 mg by mouth 2 (two) times daily.    temazepam (RESTORIL) 15 MG capsule Take 15 mg by mouth at bedtime as needed for sleep.    vitamin C (ASCORBIC ACID) 500 MG tablet Take 500 mg by mouth  daily.      STOP taking these medications     carvedilol (COREG) 6.25 MG tablet          DISCHARGE INSTRUCTIONS:   Activity as tolerated Diet heart healthy Follow-up with primary care physician in a week Follow-up with cardiology Dr. Rockey Situ in 1-3 days   DIET:  Heart healthy  DISCHARGE CONDITION:  fair  ACTIVITY:  As tolerated  OXYGEN:  Home Oxygen: No.   Oxygen Delivery: room air  DISCHARGE LOCATION:  home   If you experience worsening of your admission symptoms, develop shortness of breath, life threatening emergency, suicidal or homicidal thoughts you must seek medical attention immediately by calling 911 or  calling your MD immediately  if symptoms less severe.  You Must read complete instructions/literature along with all the possible adverse reactions/side effects for all the Medicines you take and that have been prescribed to you. Take any new Medicines after you have completely understood and accpet all the possible adverse reactions/side effects.   Please note  You were cared for by a hospitalist during your hospital stay. If you have any questions about your discharge medications or the care you received while you were in the hospital after you are discharged, you can call the unit and asked to speak with the hospitalist on call if the hospitalist that took care of you is not available. Once you are discharged, your primary care physician will handle any further medical issues. Please note that NO REFILLS for any discharge medications will be authorized once you are discharged, as it is imperative that you return to your primary care physician (or establish a relationship with a primary care physician if you do not have one) for your aftercare needs so that they can reassess your need for medications and monitor your lab values.     Today  Chief Complaint  Patient presents with  . Palpitations   Pt is feeling fine. Denies any dizziness or palpitations   ROS:  CONSTITUTIONAL: Denies fevers, chills. Denies any fatigue, weakness.  EYES: Denies blurry vision, double vision, eye pain. EARS, NOSE, THROAT: Denies tinnitus, ear pain, hearing loss. RESPIRATORY: Denies cough, wheeze, shortness of breath.  CARDIOVASCULAR: Denies chest pain, palpitations, edema.  GASTROINTESTINAL: Denies nausea, vomiting, diarrhea, abdominal pain. Denies bright red blood per rectum. GENITOURINARY: Denies dysuria, hematuria. ENDOCRINE: Denies nocturia or thyroid problems. HEMATOLOGIC AND LYMPHATIC: Denies easy bruising or bleeding. SKIN: Denies rash or lesion. MUSCULOSKELETAL: Denies pain in neck, back, shoulder,  knees, hips or arthritic symptoms.  NEUROLOGIC: Denies paralysis, paresthesias.  PSYCHIATRIC: Denies anxiety or depressive symptoms.   VITAL SIGNS:  Blood pressure 118/38, pulse 56, temperature 98.3 F (36.8 C), temperature source Oral, resp. rate 18, height 5\' 8"  (1.727 m), weight 76.658 kg (169 lb), SpO2 100 %.  I/O:   Intake/Output Summary (Last 24 hours) at 07/03/15 1325 Last data filed at 07/03/15 1046  Gross per 24 hour  Intake      0 ml  Output    850 ml  Net   -850 ml    PHYSICAL EXAMINATION:  GENERAL:  82 y.o.-year-old patient lying in the bed with no acute distress.  EYES: Pupils equal, round, reactive to light and accommodation. No scleral icterus. Extraocular muscles intact.  HEENT: Head atraumatic, normocephalic. Oropharynx and nasopharynx clear.  NECK:  Supple, no jugular venous distention. No thyroid enlargement, no tenderness.  LUNGS: Normal breath sounds bilaterally, no wheezing, rales,rhonchi or crepitation. No use of accessory muscles of respiration.  CARDIOVASCULAR:  S1, S2 normal. No murmurs, rubs, or gallops.  ABDOMEN: Soft, non-tender, non-distended. Bowel sounds present. No organomegaly or mass.  EXTREMITIES: No pedal edema, cyanosis, or clubbing.  NEUROLOGIC: Cranial nerves II through XII are intact. Muscle strength 5/5 in all extremities. Sensation intact. Gait not checked.  PSYCHIATRIC: The patient is alert and oriented x 3.  SKIN: No obvious rash, lesion, or ulcer.   DATA REVIEW:   CBC  Recent Labs Lab 07/03/15 0446  WBC 4.5  HGB 10.3*  HCT 31.3*  PLT 185    Chemistries   Recent Labs Lab 07/02/15 1742 07/03/15 0446  NA 140 142  K 4.4 4.7  CL 105 107  CO2 32 30  GLUCOSE 95 121*  BUN 32* 30*  CREATININE 1.06* 1.03*  CALCIUM 9.3 9.3  MG 2.2  --     Cardiac Enzymes  Recent Labs Lab 07/03/15 0446  TROPONINI <0.03    Microbiology Results  No results found for this or any previous visit.  RADIOLOGY:  Dg Chest 1  View  07/02/2015  CLINICAL DATA:  Irregular heart rate.  Low blood pressure. EXAM: CHEST 1 VIEW COMPARISON:  Chest radiograph 05/29/2015, CT 05/29/2015 FINDINGS: Chronic elevation of the RIGHT hemidiaphragm with bibasilar atelectasis. Cardiomegaly. No infiltrate or pneumothorax. IMPRESSION: No acute cardiopulmonary process. Chronic elevation of the RIGHT hemidiaphragm with low lung volumes. Electronically Signed   By: Suzy Bouchard M.D.   On: 07/02/2015 23:30    EKG:   Orders placed or performed during the hospital encounter of 07/02/15  . ED EKG  . ED EKG      Management plans discussed with the patient, family and they are in agreement.  CODE STATUS:     Code Status Orders        Start     Ordered   07/02/15 2232  Do not attempt resuscitation (DNR)   Continuous    Question Answer Comment  In the event of cardiac or respiratory ARREST Do not call a "code blue"   In the event of cardiac or respiratory ARREST Do not perform Intubation, CPR, defibrillation or ACLS   In the event of cardiac or respiratory ARREST Use medication by any route, position, wound care, and other measures to relive pain and suffering. May use oxygen, suction and manual treatment of airway obstruction as needed for comfort.   Comments Nurse may pronounce      07/02/15 2232    Advance Directive Documentation        Most Recent Value   Type of Advance Directive  Healthcare Power of Attorney   Pre-existing out of facility DNR order (yellow form or pink MOST form)     "MOST" Form in Place?        TOTAL TIME TAKING CARE OF THIS PATIENT: 45  minutes.    @[facility] @  on 07/03/2015 at 1:25 PM  Between 7am to 6pm - Pager - [phone]  After 6pm go to [email or web address] - password [email or web address] [facility]  [facility]  Office  [phone]  CC: Primary care physician; Webb Silversmith, NP

## 2015-07-03 NOTE — Care Management (Signed)
Found that patient was currently open to [facility] SN and PT.  Notified agency of observation admission and discharge.  Faxed h/p

## 2015-07-03 NOTE — Discharge Instructions (Signed)
Activity as tolerated Diet heart healthy Follow-up with primary care physician in a week Follow-up with cardiology Dr. Rockey Situ in 1-3 days

## 2015-07-03 NOTE — Telephone Encounter (Signed)
ER eval appropriate if no appts in office

## 2015-07-03 NOTE — Progress Notes (Signed)
Pt is a&o, VSS, Sinus Arrhythmia on tele with no complaints of pain or discomfort. PT assessed pt and pt did well. MD spoke to pts daughter and D/C orders were placed. Pt informed to hold coreg. Discharge instructions given to pt and daughter, no new prescriptions. Pt to be escorted off unit via wheelchair by nursing when ride arrives.

## 2015-07-03 NOTE — Care Management Obs Status (Signed)
[facility] NOTIFICATION   Patient Details  Name: Jill Bowers MRN: [id number] Date of Birth: May 30, 1929   Medicare Observation Status Notification Given:  Yes Notice given tom patient in room 251. She is not a code 47. IP was errorneously selected at time of admit.    [name], RN 07/03/2015, 9:34 AM

## 2015-07-03 NOTE — Telephone Encounter (Signed)
They updated there form and need information on new form In [place] in box for signature

## 2015-07-04 ENCOUNTER — Telehealth: Payer: Self-pay | Admitting: *Deleted

## 2015-07-04 DIAGNOSIS — I110 Hypertensive heart disease with heart failure: Secondary | ICD-10-CM | POA: Diagnosis not present

## 2015-07-04 DIAGNOSIS — I2510 Atherosclerotic heart disease of native coronary artery without angina pectoris: Secondary | ICD-10-CM | POA: Diagnosis not present

## 2015-07-04 DIAGNOSIS — Z9181 History of falling: Secondary | ICD-10-CM | POA: Diagnosis not present

## 2015-07-04 DIAGNOSIS — C772 Secondary and unspecified malignant neoplasm of intra-abdominal lymph nodes: Secondary | ICD-10-CM | POA: Diagnosis not present

## 2015-07-04 DIAGNOSIS — I509 Heart failure, unspecified: Secondary | ICD-10-CM | POA: Diagnosis not present

## 2015-07-04 DIAGNOSIS — C189 Malignant neoplasm of colon, unspecified: Secondary | ICD-10-CM | POA: Diagnosis not present

## 2015-07-04 NOTE — Telephone Encounter (Signed)
Transition Care Management Follow-up Telephone Call   Date discharged? 07/03/15   How have you been since you were released from the hospital? Much improved.   Do you understand why you were in the hospital? yes   Do you understand the discharge instructions? yes   Where were you discharged to? home   Items Reviewed:  Medications reviewed: yes  Allergies reviewed: yes  Dietary changes reviewed: no  Referrals reviewed: yes, cardiology   Functional Questionnaire:   Activities of Daily Living (ADLs):   She states they are independent in the following: none at baseline States they require assistance with the following: ambulation, bathing and hygiene, feeding, continence, grooming, toileting and dressing   Any transportation issues/concerns?: no   Any patient concerns? no   Confirmed importance and date/time of follow-up visits scheduled no, 07/11/15 @ 1330  Provider Appointment booked with Webb Silversmith, NP  Confirmed with patient if condition begins to worsen call PCP or go to the ER.  Patient was given the office number and encouraged to call back with question or concerns.  : yes

## 2015-07-08 NOTE — Telephone Encounter (Signed)
Paperwork faxed 07/08/15/[facility]

## 2015-07-08 NOTE — Telephone Encounter (Signed)
Done, given back to Jill Bowers 

## 2015-07-09 NOTE — Telephone Encounter (Addendum)
Jill Bowers called to ck on status of fax. Advised faxed on 07/08/15. Jill Bowers voiced understanding.

## 2015-07-11 ENCOUNTER — Ambulatory Visit (INDEPENDENT_AMBULATORY_CARE_PROVIDER_SITE_OTHER): Payer: Medicare Other | Admitting: Internal Medicine

## 2015-07-11 ENCOUNTER — Encounter: Payer: Self-pay | Admitting: Internal Medicine

## 2015-07-11 VITALS — BP 110/60 | HR 66 | Temp 99.0°F | Wt 168.0 lb

## 2015-07-11 DIAGNOSIS — R42 Dizziness and giddiness: Secondary | ICD-10-CM

## 2015-07-11 DIAGNOSIS — I509 Heart failure, unspecified: Secondary | ICD-10-CM | POA: Diagnosis not present

## 2015-07-11 DIAGNOSIS — C189 Malignant neoplasm of colon, unspecified: Secondary | ICD-10-CM | POA: Diagnosis not present

## 2015-07-11 DIAGNOSIS — R001 Bradycardia, unspecified: Secondary | ICD-10-CM

## 2015-07-11 DIAGNOSIS — R609 Edema, unspecified: Secondary | ICD-10-CM

## 2015-07-11 DIAGNOSIS — Z9181 History of falling: Secondary | ICD-10-CM | POA: Diagnosis not present

## 2015-07-11 DIAGNOSIS — I2510 Atherosclerotic heart disease of native coronary artery without angina pectoris: Secondary | ICD-10-CM | POA: Diagnosis not present

## 2015-07-11 DIAGNOSIS — C772 Secondary and unspecified malignant neoplasm of intra-abdominal lymph nodes: Secondary | ICD-10-CM | POA: Diagnosis not present

## 2015-07-11 DIAGNOSIS — I110 Hypertensive heart disease with heart failure: Secondary | ICD-10-CM | POA: Diagnosis not present

## 2015-07-11 NOTE — Progress Notes (Signed)
Subjective:    Patient ID: Jill Bowers, female    DOB: Sep 14, 1928, 82 y.o.   MRN: [id number]  HPI  Pt presents to the clinic today for TCM hospital followup for lightheadedness secondary to symptomatic bradycardia. She was admitted 1/5, discharged 1/5. Her heart rate on admission was 50. They stopped her Carvedilol and HR improved slightly. She was mildly SOB, but this was unchanged from her baseline. She had a stable anemia. Troponis were negative. Chest xray did not show any acute findings. ECG not concerning for ACS, showed sinus bradycardia with left ventricular hypertrophy. Since discharge, she reports she has been feeling well. She has been a little fatigued but denies lightheadedness or dizziness. She is working with PT on strengthening. Her appetite is good. She has no issues with her bowel or bladder. She has a follow up appt with Dr. Rockey Situ 07/14/15. Her BP today is 110/60, HR 66.  She also needs documentation and letter of medical necessity stating why she needs a hospital bed. Given her chronic respiratory issues, she needs to sleep with her HOB elevated, which is not easily obtained in a standard bed. A hospital bed would allow her to keep the [facility] elevated, make her able to breath easier. She also has swelling in her lower extremities which impairs her gait. She would benefit from the foot of the bed being elevated, which is not easily obtainable in a standard bed. The patient is also very weak, and experiencing weight loss secondary to colon cancer. She is bed/wheelchair bound. She is not able to care for herself without assitance from others. The patient also requires frequent repositioning that can not be done in a standard bed.   Review of Systems      Past Medical History  Diagnosis Date  . Colon cancer ([place])   . Coronary artery disease   . Hypertension   . Blood clot associated with vein wall inflammation   . Anemia   . DVT (deep venous thrombosis) ([place])   . PE (pulmonary  embolism)   . Hyperkalemia   . GERD (gastroesophageal reflux disease)   . Vitamin D deficiency   . Renal disorder   . Gastrointestinal hemorrhage   . Malignant neoplasm of stomach ([place])   . CHF (congestive heart failure) ([place])   . Renal insufficiency   . Chicken pox   . Frequent headaches     Current Outpatient Prescriptions  Medication Sig Dispense Refill  . acetaminophen (TYLENOL) 650 MG CR tablet Take 650 mg by mouth 3 (three) times daily with meals.    Marland Kitchen aspirin (ASPIRIN EC) 81 MG EC tablet Take 81 mg by mouth daily. Swallow whole.    . Cholecalciferol (VITAMIN D-3) 1000 UNITS CAPS Take 2 capsules by mouth daily.    . furosemide (LASIX) 40 MG tablet Take 40 mg by mouth 2 (two) times daily.    . Magnesium Oxide 500 MG TABS Take 1 tablet by mouth 2 (two) times daily.    Marland Kitchen omeprazole (PRILOSEC) 20 MG capsule Take 20 mg by mouth daily.    Marland Kitchen oxyCODONE-acetaminophen (ROXICET) 5-325 MG tablet Take 1 tablet by mouth every 12 (twelve) hours as needed for moderate pain. 60 tablet 0  . spironolactone (ALDACTONE) 25 MG tablet Take 25 mg by mouth 2 (two) times daily.    . vitamin C (ASCORBIC ACID) 500 MG tablet Take 500 mg by mouth daily.    . carvedilol (COREG) 6.25 MG tablet Take 6.25 mg by mouth 2 (two)  times daily with a meal.    . meclizine (ANTIVERT) 25 MG tablet Take 1 tablet (25 mg total) by mouth 3 (three) times daily as needed for dizziness. (Patient not taking: Reported on 07/11/2015) 30 tablet 0  . temazepam (RESTORIL) 15 MG capsule Take 15 mg by mouth at bedtime as needed for sleep. Reported on 07/11/2015     No current facility-administered medications for this visit.    Allergies  Allergen Reactions  . Nsaids Other (See Comments)  . Reglan [Metoclopramide] Other (See Comments)  . Statins Other (See Comments)  . Torsemide Other (See Comments)    Family History  Problem Relation Age of Onset  . Arthritis Mother   . Hypertension Mother   . Colon cancer Sister   . Heart  disease Daughter   . Colon cancer Son     Social History   Social History  . Marital Status: Widowed    Spouse Name: N/A  . Number of Children: N/A  . Years of Education: N/A   Occupational History  . Not on file.   Social History Main Topics  . Smoking status: Never Smoker   . Smokeless tobacco: Not on file  . Alcohol Use: No  . Drug Use: No  . Sexual Activity: No   Other Topics Concern  . Not on file   Social History Narrative     Constitutional: Pt reports fatigue. Denies fever, malaise, headache or abrupt weight changes.  Respiratory: Denies difficulty breathing, shortness of breath, cough or sputum production.   Cardiovascular: Pt reports swelling in feet. Denies chest pain, chest tightness, palpitations or swelling in the hands.  Gastrointestinal: Denies abdominal pain, bloating, constipation, diarrhea or blood in the stool.  GU: Denies urgency, frequency, pain with urination, burning sensation, blood in urine, odor or discharge. Neurological: Pt has difficulty with balance and coordination. Denies dizziness, difficulty with memory, difficulty with speech.    No other specific complaints in a complete review of systems (except as listed in HPI above).  Objective:   Physical Exam   BP 110/60 mmHg  Pulse 66  Temp(Src) 99 F (37.2 C) (Oral)  Wt 168 lb (76.204 kg)  SpO2 98% Wt Readings from Last 3 Encounters:  07/11/15 168 lb (76.204 kg)  07/02/15 169 lb (76.658 kg)  06/12/15 169 lb (76.658 kg)    General: Appears her stated age, obese, chronically ill appearing, in NAD. Cardiovascular: Normal rate and rhythm. S1,S2 noted.  No murmur, rubs or gallops noted. 1-2+ BLE edema.  Pulmonary/Chest: Normal effort and positive vesicular breath sounds. No respiratory distress. No wheezes, rales or ronchi noted.  Abdomen: Soft and nontender.  Musculoskeletal: Not assessed, she is in a wheelchair. Neurological: Alert and oriented.    BMET    Component Value  Date/Time   NA 142 07/03/2015 0446   K 4.7 07/03/2015 0446   CL 107 07/03/2015 0446   CO2 30 07/03/2015 0446   GLUCOSE 121* 07/03/2015 0446   BUN 30* 07/03/2015 0446   CREATININE 1.03* 07/03/2015 0446   CALCIUM 9.3 07/03/2015 0446   GFRNONAA 49* 07/03/2015 0446   GFRAA 56* 07/03/2015 0446    Lipid Panel     Component Value Date/Time   CHOL 172 06/12/2015 1125   TRIG 155.0* 06/12/2015 1125   HDL 52.60 06/12/2015 1125   CHOLHDL 3 06/12/2015 1125   VLDL 31.0 06/12/2015 1125   LDLCALC 89 06/12/2015 1125    CBC    Component Value Date/Time   WBC 4.5 07/03/2015  0446   RBC 3.34* 07/03/2015 0446   HGB 10.3* 07/03/2015 0446   HCT 31.3* 07/03/2015 0446   PLT 185 07/03/2015 0446   MCV 93.8 07/03/2015 0446   MCH 30.7 07/03/2015 0446   MCHC 32.7 07/03/2015 0446   RDW 15.8* 07/03/2015 0446   LYMPHSABS 1.8 06/02/2015 1331   MONOABS 0.3 06/02/2015 1331   EOSABS 0.1 06/02/2015 1331   BASOSABS 0.1 06/02/2015 1331    Hgb A1C Lab Results  Component Value Date   HGBA1C 5.8 06/12/2015         Assessment & Plan:   Hospital followup for lightheadedness and symptomatic bradycardia:  Hospital notes, labs, procedures and imaging reviewed Pt doing well off Carvedilol No indication to repeat ECG or labs today RX for TED hose for LE edema She will follow up with Dr. Rockey Situ on Monday  RTC as needed

## 2015-07-11 NOTE — Addendum Note (Signed)
Addended by: Jearld Fenton on: 07/11/2015 02:08 PM   Modules accepted: Orders

## 2015-07-11 NOTE — Progress Notes (Signed)
Pre visit review using our clinic review tool, if applicable. No additional management support is needed unless otherwise documented below in the visit note. 

## 2015-07-11 NOTE — Patient Instructions (Signed)
Bradycardia  Bradycardia is a slower-than-normal heart rate. A normal resting heart rate for an adult ranges from 60 to 100 beats per minute. With bradycardia, the resting heart rate is less than 60 beats per minute.  Bradycardia is a problem if your heart cannot pump enough oxygen-rich blood through your body. Bradycardia is not a problem for everyone. For some healthy adults, a slow resting heart rate is normal.   CAUSES   Bradycardia may be caused by:  · A problem with the heart's electrical system, such as heart block.  · A problem with the heart's natural pacemaker (sinus node).  · Heart disease, damage, or infection.  · Certain medicines that treat heart conditions.  · Certain conditions, such as hypothyroidism and obstructive sleep apnea.  RISK FACTORS   Risk factors include:  · Being 65 or older.  · Having high blood pressure (hypertension), high cholesterol (hyperlipidemia), or diabetes.  · Drinking heavily, using tobacco products, or using drugs.  · Being stressed.  SIGNS AND SYMPTOMS   Signs and symptoms include:  · Light-headedness.  · Fainting or near fainting.  · Fatigue and weakness.  · Shortness of breath.  · Chest pain (angina).  · Drowsiness.  · Confusion.  · Dizziness.  DIAGNOSIS   Diagnosis of bradycardia may include:  · A physical exam.  · An electrocardiogram (ECG).  · Blood tests.  TREATMENT   Treatment for bradycardia may include:  · Treatment of an underlying condition.  · Pacemaker placement. A pacemaker is a small, battery-powered device that is placed under the skin and is programmed to sense your heartbeats. If your heart rate is lower than the programmed rate, the pacemaker will pace your heart.  · Changing your medicines or dosages.  HOME CARE INSTRUCTIONS  · Take medicines only as directed by your health care provider.  · Manage any health conditions that contribute to bradycardia as directed by your health care provider.  · Follow a heart-healthy diet. A dietitian can help educate  you on healthy food options and changes.  · Follow an exercise program approved by your health care provider.  · Maintain a healthy weight. Lose weight as approved by your health care provider.  · Do not use tobacco products, including cigarettes, chewing tobacco, or electronic cigarettes. If you need help quitting, ask your health care provider.  · Do not use illegal drugs.  · Limit alcohol intake to no more than 1 drink per day for nonpregnant women and 2 drinks per day for men. One drink equals 12 ounces of beer, 5 ounces of wine, or 1½ ounces of hard liquor.  · Keep all follow-up visits as directed by your health care provider. This is important.  SEEK MEDICAL CARE IF:  · You feel light-headed or dizzy.  · You almost faint.  · You feel weak or are easily fatigued during physical activity.  · You experience confusion or have memory problems.  SEEK IMMEDIATE MEDICAL CARE IF:   · You faint.  · You have an irregular heartbeat.  · You have chest pain.  · You have trouble breathing.  MAKE SURE YOU:   · Understand these instructions.  · Will watch your condition.  · Will get help right away if you are not doing well or get worse.     This information is not intended to replace advice given to you by your health care provider. Make sure you discuss any questions you have with your health care provider.       Document Released: 03/06/2002 Document Revised: 07/05/2014 Document Reviewed: 09/19/2013  Elsevier Interactive Patient Education ©2016 Elsevier Inc[employer]

## 2015-07-14 ENCOUNTER — Telehealth: Payer: Self-pay

## 2015-07-14 ENCOUNTER — Ambulatory Visit (INDEPENDENT_AMBULATORY_CARE_PROVIDER_SITE_OTHER): Payer: Medicare Other | Admitting: Cardiovascular Disease

## 2015-07-14 ENCOUNTER — Encounter: Payer: Self-pay | Admitting: Cardiovascular Disease

## 2015-07-14 VITALS — BP 142/58 | HR 61 | Ht 63.0 in | Wt 169.0 lb

## 2015-07-14 DIAGNOSIS — I10 Essential (primary) hypertension: Secondary | ICD-10-CM

## 2015-07-14 DIAGNOSIS — I509 Heart failure, unspecified: Secondary | ICD-10-CM | POA: Diagnosis not present

## 2015-07-14 DIAGNOSIS — R0602 Shortness of breath: Secondary | ICD-10-CM

## 2015-07-14 DIAGNOSIS — R600 Localized edema: Secondary | ICD-10-CM | POA: Diagnosis not present

## 2015-07-14 DIAGNOSIS — Z86711 Personal history of pulmonary embolism: Secondary | ICD-10-CM

## 2015-07-14 DIAGNOSIS — R001 Bradycardia, unspecified: Secondary | ICD-10-CM

## 2015-07-14 DIAGNOSIS — Z86718 Personal history of other venous thrombosis and embolism: Secondary | ICD-10-CM

## 2015-07-14 DIAGNOSIS — I739 Peripheral vascular disease, unspecified: Secondary | ICD-10-CM

## 2015-07-14 MED ORDER — SPIRONOLACTONE 25 MG PO TABS: 25.0000 mg | ORAL_TABLET | Freq: Two times a day (BID) | ORAL | Status: DC | PRN

## 2015-07-14 MED ORDER — FUROSEMIDE 40 MG PO TABS: 40.0000 mg | ORAL_TABLET | Freq: Two times a day (BID) | ORAL | Status: DC | PRN

## 2015-07-14 NOTE — Telephone Encounter (Signed)
Order for hospital bed with OV noted faxed to [phone] [facility]

## 2015-07-14 NOTE — Assessment & Plan Note (Signed)
Bradycardia improved by holding carvedilol. No other medication changes made

## 2015-07-14 NOTE — Assessment & Plan Note (Signed)
CT scan of the chest reviewed  She has mild aortic arch, descending aorta disease  Minimal  Coronary arterycalcified plaque , large  Coronary artery vessels noted

## 2015-07-14 NOTE — Patient Instructions (Addendum)
You are doing well.  Leg swelling is likely from venous insufficiency Would suggest TED hose, compression hose to the knee, maybe the thigh  Make the afternoon lasix and spironolactone as needed for weight gain, leg swelling, shortness of breath Stay on the morning lasix and morning spironolactone  Please call us if you have new issues that need to be addressed before your next appt.  Your physician wants you to follow-up in: 6 months.  You will receive a reminder letter in the mail two months in advance. If you don't receive a letter, please call our office to schedule the follow-up appointment.  How to Use Compression Stockings Compression stockings are elastic socks that squeeze the legs. They help to increase blood flow to the legs, decrease swelling in the legs, and reduce the chance of developing blood clots in the lower legs. Compression stockings are often used by people who:  Are recovering from surgery.  Have poor circulation in their legs.  Are prone to getting blood clots in their legs.  Have varicose veins.  Sit or stay in bed for long periods of time. HOW TO USE COMPRESSION STOCKINGS Before you put on your compression stockings:  Make sure that they are the correct size. If you do not know your size, ask your health care provider.  Make sure that they are clean, dry, and in good condition.  Check them for rips and tears. Do not put them on if they are ripped or torn. Put your stockings on first thing in the morning, before you get out of bed. Keep them on for as long as your health care provider advises. When you are wearing your stockings:  Keep them as smooth as possible. Do not allow them to bunch up. It is especially important to prevent the stockings from bunching up around your toes or behind your knees.  Do not roll the stockings downward and leave them rolled down. This can decrease blood flow to your leg.  Change them right away if they become wet or  dirty. When you take off your stockings, inspect your legs and feet. Anything that does not seem normal may require medical attention. Look for:  Open sores.  Red spots.  Swelling. INFORMATION AND TIPS  Do not stop wearing your compression stockings without talking to your health care provider first.  Wash your stockings everyday with mild detergent in cold or warm water. Do not use bleach. Air-dry your stockings or dry them in a clothes dryer on low heat.  Replace your stockings every 3-6 months.  If skin moisturizing is part of your treatment plan, apply lotion or cream at night so that your skin will be dry when you put on the stockings in the morning. It is harder to put the stockings on when you have lotion on your legs or feet. SEEK MEDICAL CARE IF: Remove your stockings and seek medical care if:  You have a feeling of pins and needles in your feet or legs.  You have any new changes in your skin.  You have skin lesions that are getting worse.  You have swelling or pain that is getting worse. SEEK IMMEDIATE MEDICAL CARE IF:  You have numbness or tingling in your lower legs that does not get better immediately after you take the stockings off.  Your toes or feet become cold and blue.  You develop open sores or red spots on your legs that do not go away.  You see or feel a  warm spot on your leg.  You have new swelling or soreness in your leg.  You are short of breath or you have chest pain for no reason.  You have a rapid or irregular heartbeat.  You feel light-headed or dizzy.   This information is not intended to replace advice given to you by your health care provider. Make sure you discuss any questions you have with your health care provider.   Document Released: 04/11/2009 Document Revised: 10/29/2014 Document Reviewed: 05/22/2014 Elsevier Interactive Patient Education [employer].

## 2015-07-14 NOTE — Assessment & Plan Note (Signed)
Very sedentary at baseline, not walking very much  Spends much of her day sitting, and in a hospital bed  Stressed importance of compression hose, leg movement

## 2015-07-14 NOTE — Assessment & Plan Note (Signed)
Carvedilol/ Beta blockers held  For recent bradycardia , blood pressure stable

## 2015-07-14 NOTE — Assessment & Plan Note (Signed)
Recent echocardiogram showing normal ejection fraction  Unable to exclude component of diastolic CHF  Does not drink very much, elevated BUN, creatinine.  Recommended she make her afternoon Lasix and Aldactone when necessary

## 2015-07-14 NOTE — Progress Notes (Signed)
Patient ID: Jill Bowers, female    DOB: 1929-05-21, 82 y.o.   MRN: [id number]  HPI Comments:  Jill Bowers is a pleasant 82 year old woman who presents for new patient evaluation for bradycardia , recently hospitalized at [facility]  For bradycardia,  At the time of discharged was referred to our office by Dr. Margaretmary Eddy.  history of DVT, PE, IVC filter in place, prior rectal bleeding, not on anticoagulation  Notes indicate history of anemia,  History of colon cancer, followed by oncology, stage III   she currently lives at home with her daughter , previously was living in [place]  She was working with home health, PT.  They noted low heart rate at home  was recently evaluated in the hospital 07/02/2015 for heart rate in the 40s.  Also was reported to have low blood pressure.   Lab work reviewed with the patient,Mildly elevated creatinine, BUN.  Reports she has been taking Lasix 40 mg twice a day for quite some time for leg edema.   Echocardiogram in the hospital reviewed, normal ejection fraction,  Mention of posterior pericardial effusion  carvedilol was held, she had improvement of her heart rate , discharged with follow-up in clinic today   She does not walk very far, legs are weak  She has some positional shortness of breath such as when she ties her shoes, also on exertion,  Such as getting up from chairs and walking   EKG on today's visit shows normal sinus rhythm with rate 61 bpm , no significant ST or T-wave changes     Allergies  Allergen Reactions  . Nsaids Other (See Comments)  . Reglan [Metoclopramide] Other (See Comments)  . Statins Other (See Comments)  . Torsemide Other (See Comments)    Current Outpatient Prescriptions on File Prior to Visit  Medication Sig Dispense Refill  . acetaminophen (TYLENOL) 650 MG CR tablet Take 650 mg by mouth 3 (three) times daily with meals.    Marland Kitchen aspirin (ASPIRIN EC) 81 MG EC tablet Take 81 mg by mouth daily. Swallow whole.    .  Cholecalciferol (VITAMIN D-3) 1000 UNITS CAPS Take 2 capsules by mouth daily.    . Magnesium Oxide 500 MG TABS Take 1 tablet by mouth 2 (two) times daily.    Marland Kitchen omeprazole (PRILOSEC) 20 MG capsule Take 20 mg by mouth daily.    Marland Kitchen oxyCODONE-acetaminophen (ROXICET) 5-325 MG tablet Take 1 tablet by mouth every 12 (twelve) hours as needed for moderate pain. 60 tablet 0  . vitamin C (ASCORBIC ACID) 500 MG tablet Take 500 mg by mouth daily.     No current facility-administered medications on file prior to visit.    Past Medical History  Diagnosis Date  . Colon cancer ([place])   . Coronary artery disease   . Hypertension   . Blood clot associated with vein wall inflammation   . Anemia   . DVT (deep venous thrombosis) ([place])   . PE (pulmonary embolism)   . Hyperkalemia   . GERD (gastroesophageal reflux disease)   . Vitamin D deficiency   . Renal disorder   . Gastrointestinal hemorrhage   . Malignant neoplasm of stomach ([place])   . CHF (congestive heart failure) ([place])   . Renal insufficiency   . Chicken pox   . Frequent headaches     Past Surgical History  Procedure Laterality Date  . Ivc filter placement ([facility] hx)    . Colon surgery    . Abdominal hysterectomy    .  Joint replacement Right     knee    Social History  reports that she has never smoked. She does not have any smokeless tobacco history on file. She reports that she does not drink alcohol or use illicit drugs.  Family History family history includes Arthritis in her mother; Colon cancer in her sister and son; Heart disease in her daughter; Hypertension in her mother.   Review of Systems  Constitutional: Negative.   Respiratory: Positive for shortness of breath.   Cardiovascular: Positive for leg swelling.  Gastrointestinal: Negative.   Musculoskeletal: Positive for gait problem.  Neurological: Negative.   Hematological: Negative.   Psychiatric/Behavioral: Negative.   All other systems reviewed and are  negative.   BP 142/58 mmHg  Pulse 61  Ht 5\' 3"  (1.6 m)  Wt 169 lb (76.658 kg)  BMI 29.94 kg/m2  Physical Exam  Constitutional: She is oriented to person, place, and time. She appears well-developed and well-nourished.   Presents in a wheelchair  HENT:  Head: Normocephalic.  Nose: Nose normal.  Mouth/Throat: Oropharynx is clear and moist.  Eyes: Conjunctivae are normal. Pupils are equal, round, and reactive to light.  Neck: Normal range of motion. Neck supple. No JVD present.  Cardiovascular: Normal rate, regular rhythm and intact distal pulses.  Exam reveals no gallop and no friction rub.   Murmur heard.  Systolic murmur is present with a grade of 2/6   Trace edema above the sock line bilaterally  Pulmonary/Chest: Effort normal and breath sounds normal. No respiratory distress. She has no wheezes. She has no rales. She exhibits no tenderness.  Abdominal: Soft. Bowel sounds are normal. She exhibits no distension. There is no tenderness.  Musculoskeletal: Normal range of motion. She exhibits no edema or tenderness.  Lymphadenopathy:    She has no cervical adenopathy.  Neurological: She is alert and oriented to person, place, and time. Coordination normal.  Skin: Skin is warm and dry. No rash noted. No erythema.  Psychiatric: She has a normal mood and affect. Her behavior is normal. Judgment and thought content normal.

## 2015-07-14 NOTE — Assessment & Plan Note (Signed)
Recommended compression hose, leg movement

## 2015-07-14 NOTE — Assessment & Plan Note (Signed)
Minimal leg edema, likely secondary to venous insufficiency, less likely heart failure.  Elevated creatinine, BUN , possibly prerenal.  Recommended she decrease her Lasix down to 40 mg daily with spironolactone  Afternoon doses only as needed for worsening leg edema, weight gain, shortness of breath

## 2015-07-14 NOTE — Assessment & Plan Note (Signed)
Blood pressure is well controlled on today's visit. No changes made to the medications. 

## 2015-07-15 DIAGNOSIS — C189 Malignant neoplasm of colon, unspecified: Secondary | ICD-10-CM | POA: Diagnosis not present

## 2015-07-15 DIAGNOSIS — Z9181 History of falling: Secondary | ICD-10-CM | POA: Diagnosis not present

## 2015-07-15 DIAGNOSIS — C772 Secondary and unspecified malignant neoplasm of intra-abdominal lymph nodes: Secondary | ICD-10-CM | POA: Diagnosis not present

## 2015-07-15 DIAGNOSIS — I2510 Atherosclerotic heart disease of native coronary artery without angina pectoris: Secondary | ICD-10-CM | POA: Diagnosis not present

## 2015-07-15 DIAGNOSIS — I110 Hypertensive heart disease with heart failure: Secondary | ICD-10-CM | POA: Diagnosis not present

## 2015-07-15 DIAGNOSIS — I509 Heart failure, unspecified: Secondary | ICD-10-CM | POA: Diagnosis not present

## 2015-07-17 DIAGNOSIS — C772 Secondary and unspecified malignant neoplasm of intra-abdominal lymph nodes: Secondary | ICD-10-CM | POA: Diagnosis not present

## 2015-07-17 DIAGNOSIS — C189 Malignant neoplasm of colon, unspecified: Secondary | ICD-10-CM | POA: Diagnosis not present

## 2015-07-17 DIAGNOSIS — Z9181 History of falling: Secondary | ICD-10-CM | POA: Diagnosis not present

## 2015-07-17 DIAGNOSIS — I509 Heart failure, unspecified: Secondary | ICD-10-CM | POA: Diagnosis not present

## 2015-07-17 DIAGNOSIS — I2510 Atherosclerotic heart disease of native coronary artery without angina pectoris: Secondary | ICD-10-CM | POA: Diagnosis not present

## 2015-07-17 DIAGNOSIS — I110 Hypertensive heart disease with heart failure: Secondary | ICD-10-CM | POA: Diagnosis not present

## 2015-07-18 ENCOUNTER — Telehealth: Payer: Self-pay | Admitting: Internal Medicine

## 2015-07-18 NOTE — Telephone Encounter (Signed)
noted 

## 2015-07-18 NOTE — Telephone Encounter (Signed)
Dawn called to let you know pt miss appointment on 1/12 because of dr appointment pt didn't want to r/s

## 2015-07-22 DIAGNOSIS — I2510 Atherosclerotic heart disease of native coronary artery without angina pectoris: Secondary | ICD-10-CM | POA: Diagnosis not present

## 2015-07-22 DIAGNOSIS — Z9181 History of falling: Secondary | ICD-10-CM | POA: Diagnosis not present

## 2015-07-22 DIAGNOSIS — I110 Hypertensive heart disease with heart failure: Secondary | ICD-10-CM | POA: Diagnosis not present

## 2015-07-22 DIAGNOSIS — C772 Secondary and unspecified malignant neoplasm of intra-abdominal lymph nodes: Secondary | ICD-10-CM | POA: Diagnosis not present

## 2015-07-22 DIAGNOSIS — C189 Malignant neoplasm of colon, unspecified: Secondary | ICD-10-CM | POA: Diagnosis not present

## 2015-07-22 DIAGNOSIS — I509 Heart failure, unspecified: Secondary | ICD-10-CM | POA: Diagnosis not present

## 2015-07-25 ENCOUNTER — Ambulatory Visit: Payer: Medicare Other | Admitting: Nurse Practitioner

## 2015-07-31 ENCOUNTER — Telehealth: Payer: Self-pay | Admitting: Internal Medicine

## 2015-07-31 NOTE — Telephone Encounter (Signed)
Dawn @ [facility] called to let you know about a missed appointment on 07/25/15.  She made several attempt to call her No answer and no return phone call

## 2015-07-31 NOTE — Telephone Encounter (Signed)
noted 

## 2015-08-18 DIAGNOSIS — H40013 Open angle with borderline findings, low risk, bilateral: Secondary | ICD-10-CM | POA: Diagnosis not present

## 2015-08-18 DIAGNOSIS — H40033 Anatomical narrow angle, bilateral: Secondary | ICD-10-CM | POA: Diagnosis not present

## 2015-08-18 DIAGNOSIS — H2513 Age-related nuclear cataract, bilateral: Secondary | ICD-10-CM | POA: Diagnosis not present

## 2015-09-11 ENCOUNTER — Other Ambulatory Visit (INDEPENDENT_AMBULATORY_CARE_PROVIDER_SITE_OTHER): Payer: Medicare Other

## 2015-09-11 DIAGNOSIS — R0602 Shortness of breath: Secondary | ICD-10-CM | POA: Diagnosis not present

## 2015-09-11 DIAGNOSIS — R001 Bradycardia, unspecified: Secondary | ICD-10-CM | POA: Diagnosis not present

## 2015-09-11 DIAGNOSIS — I509 Heart failure, unspecified: Secondary | ICD-10-CM | POA: Diagnosis not present

## 2015-09-12 LAB — BASIC METABOLIC PANEL
BUN / CREAT RATIO: 26 (ref 11–26)
BUN: 25 mg/dL (ref 8–27)
CHLORIDE: 101 mmol/L (ref 96–106)
CO2: 26 mmol/L (ref 18–29)
CREATININE: 0.97 mg/dL (ref 0.57–1.00)
Calcium: 9.3 mg/dL (ref 8.7–10.3)
GFR calc non Af Amer: 54 mL/min/{1.73_m2} — ABNORMAL LOW (ref >59)
GFR, EST AFRICAN AMERICAN: 62 mL/min/{1.73_m2} (ref >59)
Glucose: 124 mg/dL — ABNORMAL HIGH (ref 65–99)
Potassium: 4.6 mmol/L (ref 3.5–5.2)
Sodium: 141 mmol/L (ref 134–144)

## 2015-12-01 ENCOUNTER — Telehealth: Payer: Self-pay | Admitting: Internal Medicine

## 2015-12-01 ENCOUNTER — Ambulatory Visit
Admission: RE | Admit: 2015-12-01 | Discharge: 2015-12-01 | Disposition: A | Discharge status: Home / Self Care | Payer: Medicare Other | Source: Ambulatory Visit | Attending: Internal Medicine | Admitting: Internal Medicine

## 2015-12-01 ENCOUNTER — Telehealth: Payer: Self-pay | Admitting: Cardiovascular Disease

## 2015-12-01 ENCOUNTER — Inpatient Hospital Stay: Payer: Medicare Other | Attending: Internal Medicine

## 2015-12-01 ENCOUNTER — Other Ambulatory Visit: Payer: Medicaid Other

## 2015-12-01 ENCOUNTER — Ambulatory Visit: Payer: Medicaid Other

## 2015-12-01 DIAGNOSIS — Z79899 Other long term (current) drug therapy: Secondary | ICD-10-CM | POA: Diagnosis not present

## 2015-12-01 DIAGNOSIS — Z86718 Personal history of other venous thrombosis and embolism: Secondary | ICD-10-CM | POA: Insufficient documentation

## 2015-12-01 DIAGNOSIS — R600 Localized edema: Secondary | ICD-10-CM | POA: Diagnosis not present

## 2015-12-01 DIAGNOSIS — Z86711 Personal history of pulmonary embolism: Secondary | ICD-10-CM | POA: Diagnosis not present

## 2015-12-01 DIAGNOSIS — Z85038 Personal history of other malignant neoplasm of large intestine: Secondary | ICD-10-CM | POA: Insufficient documentation

## 2015-12-01 DIAGNOSIS — R635 Abnormal weight gain: Secondary | ICD-10-CM | POA: Insufficient documentation

## 2015-12-01 DIAGNOSIS — Z7982 Long term (current) use of aspirin: Secondary | ICD-10-CM | POA: Insufficient documentation

## 2015-12-01 DIAGNOSIS — R970 Elevated carcinoembryonic antigen [CEA]: Secondary | ICD-10-CM | POA: Insufficient documentation

## 2015-12-01 DIAGNOSIS — I10 Essential (primary) hypertension: Secondary | ICD-10-CM | POA: Insufficient documentation

## 2015-12-01 DIAGNOSIS — M549 Dorsalgia, unspecified: Secondary | ICD-10-CM | POA: Diagnosis not present

## 2015-12-01 DIAGNOSIS — C772 Secondary and unspecified malignant neoplasm of intra-abdominal lymph nodes: Principal | ICD-10-CM

## 2015-12-01 DIAGNOSIS — N859 Noninflammatory disorder of uterus, unspecified: Secondary | ICD-10-CM | POA: Insufficient documentation

## 2015-12-01 DIAGNOSIS — Z9049 Acquired absence of other specified parts of digestive tract: Secondary | ICD-10-CM | POA: Insufficient documentation

## 2015-12-01 DIAGNOSIS — I2510 Atherosclerotic heart disease of native coronary artery without angina pectoris: Secondary | ICD-10-CM | POA: Insufficient documentation

## 2015-12-01 DIAGNOSIS — K219 Gastro-esophageal reflux disease without esophagitis: Secondary | ICD-10-CM | POA: Diagnosis not present

## 2015-12-01 DIAGNOSIS — G8929 Other chronic pain: Secondary | ICD-10-CM | POA: Insufficient documentation

## 2015-12-01 DIAGNOSIS — K7689 Other specified diseases of liver: Secondary | ICD-10-CM | POA: Insufficient documentation

## 2015-12-01 DIAGNOSIS — C189 Malignant neoplasm of colon, unspecified: Secondary | ICD-10-CM | POA: Insufficient documentation

## 2015-12-01 DIAGNOSIS — Z8719 Personal history of other diseases of the digestive system: Secondary | ICD-10-CM | POA: Insufficient documentation

## 2015-12-01 DIAGNOSIS — R0602 Shortness of breath: Secondary | ICD-10-CM | POA: Diagnosis not present

## 2015-12-01 DIAGNOSIS — R938 Abnormal findings on diagnostic imaging of other specified body structures: Secondary | ICD-10-CM | POA: Insufficient documentation

## 2015-12-01 LAB — CBC WITH DIFFERENTIAL/PLATELET
BASOS ABS: 0 10*3/uL (ref 0–0.1)
BASOS PCT: 1 %
Eosinophils Absolute: 0.1 10*3/uL (ref 0–0.7)
Eosinophils Relative: 2 %
HEMATOCRIT: 37.8 % (ref 35.0–47.0)
HEMOGLOBIN: 12.8 g/dL (ref 12.0–16.0)
Lymphocytes Relative: 43 %
Lymphs Abs: 2.2 10*3/uL (ref 1.0–3.6)
MCH: 31.1 pg (ref 26.0–34.0)
MCHC: 33.8 g/dL (ref 32.0–36.0)
MCV: 92 fL (ref 80.0–100.0)
MONO ABS: 0.4 10*3/uL (ref 0.2–0.9)
Monocytes Relative: 7 %
NEUTROS ABS: 2.4 10*3/uL (ref 1.4–6.5)
NEUTROS PCT: 47 %
Platelets: 222 10*3/uL (ref 150–440)
RBC: 4.11 MIL/uL (ref 3.80–5.20)
RDW: 13.6 % (ref 11.5–14.5)
WBC: 5.2 10*3/uL (ref 3.6–11.0)

## 2015-12-01 LAB — COMPREHENSIVE METABOLIC PANEL
ALT: 12 U/L — ABNORMAL LOW (ref 14–54)
ANION GAP: 6 (ref 5–15)
AST: 18 U/L (ref 15–41)
Albumin: 4.5 g/dL (ref 3.5–5.0)
Alkaline Phosphatase: 65 U/L (ref 38–126)
BUN: 28 mg/dL — ABNORMAL HIGH (ref 6–20)
CHLORIDE: 103 mmol/L (ref 101–111)
CO2: 29 mmol/L (ref 22–32)
Calcium: 9.4 mg/dL (ref 8.9–10.3)
Creatinine, Ser: 1.08 mg/dL — ABNORMAL HIGH (ref 0.44–1.00)
GFR calc non Af Amer: 46 mL/min — ABNORMAL LOW (ref >60)
GFR, EST AFRICAN AMERICAN: 53 mL/min — AB (ref >60)
Glucose, Bld: 104 mg/dL — ABNORMAL HIGH (ref 65–99)
POTASSIUM: 4 mmol/L (ref 3.5–5.1)
Sodium: 138 mmol/L (ref 135–145)
Total Bilirubin: 0.6 mg/dL (ref 0.3–1.2)
Total Protein: 7.4 g/dL (ref 6.5–8.1)

## 2015-12-01 MED ORDER — IOPAMIDOL (ISOVUE-300) INJECTION 61%
100.0000 mL | Freq: Once | INTRAVENOUS | Status: AC | PRN
  Administered 2015-12-01: 100 mL via INTRAVENOUS

## 2015-12-01 NOTE — Telephone Encounter (Signed)
Patient Name: Jill Bowers DOB: December 31, 1928 Initial Comment Caller states her mother is having pain in both of her legs. Nurse Assessment Nurse: Vallery Sa, RN, [place] Date/Time (Eastern Time): 12/01/2015 1:35:53 PM Confirm and document reason for call. If symptomatic, describe symptoms. You must click the next button to save text entered. ---[name] states that [name] developed pain in both legs about a week ago (rated as a 9 on the scale 1 to 10 scale). No swelling. No known injury in the past week. No fever. Alert and responsive. Has the patient traveled out of the country within the last 30 days? ---No Does the patient have any new or worsening symptoms? ---Yes Will a triage be completed? ---Yes Related visit to physician within the last 2 weeks? ---Yes Does the PT have any chronic conditions? (i.e. diabetes, asthma, etc.) ---Yes List chronic conditions. ---Bone Cancer, Blood Clots in the past, Heart Disease, High Blood Pressure Is this a behavioral health or substance abuse call? ---No Guidelines Guideline Title Affirmed Question Affirmed Notes Leg Pain Unable to walk Final Disposition User Go to ED Now Vallery Sa, RN, [facility] - ED Disagree/Comply: Comply

## 2015-12-01 NOTE — Telephone Encounter (Signed)
Pt daughter calling stating pt is complaining of pains in her leg, back part of the leg. Right above her knees going up her thigh  Both legs, going on for about a week She is concerned about patient.  Has history of blood clots.  Please advise.

## 2015-12-01 NOTE — Telephone Encounter (Signed)
Spoke w/ [name].  She reports that pt is having pain in both of her legs x 1 week.  Denies redness or swelling.  She is currently at [facility] for a CT scan, unrelated to sx. Advised her that Dr. Rockey Situ is out of the office this week (no MD currently in the office, Dr. Fletcher Anon rounding). Recommended she contact PCP's office and see if they recommend u/s, and if so, see if she can have it done there. Asked her to call back if we can be of further assistance.

## 2015-12-02 LAB — CEA: CEA: 10.3 ng/mL — ABNORMAL HIGH (ref 0.0–4.7)

## 2015-12-03 ENCOUNTER — Inpatient Hospital Stay (HOSPITAL_BASED_OUTPATIENT_CLINIC_OR_DEPARTMENT_OTHER): Payer: Medicare Other | Admitting: Internal Medicine

## 2015-12-03 VITALS — BP 137/74 | HR 60 | Temp 97.9°F | Resp 18 | Wt 191.2 lb

## 2015-12-03 DIAGNOSIS — R938 Abnormal findings on diagnostic imaging of other specified body structures: Secondary | ICD-10-CM | POA: Diagnosis not present

## 2015-12-03 DIAGNOSIS — Z79899 Other long term (current) drug therapy: Secondary | ICD-10-CM

## 2015-12-03 DIAGNOSIS — R635 Abnormal weight gain: Secondary | ICD-10-CM | POA: Diagnosis not present

## 2015-12-03 DIAGNOSIS — Z7982 Long term (current) use of aspirin: Secondary | ICD-10-CM

## 2015-12-03 DIAGNOSIS — C189 Malignant neoplasm of colon, unspecified: Secondary | ICD-10-CM | POA: Diagnosis not present

## 2015-12-03 DIAGNOSIS — Z8719 Personal history of other diseases of the digestive system: Secondary | ICD-10-CM

## 2015-12-03 DIAGNOSIS — R970 Elevated carcinoembryonic antigen [CEA]: Secondary | ICD-10-CM | POA: Diagnosis not present

## 2015-12-03 DIAGNOSIS — Z86718 Personal history of other venous thrombosis and embolism: Secondary | ICD-10-CM

## 2015-12-03 DIAGNOSIS — Z86711 Personal history of pulmonary embolism: Secondary | ICD-10-CM

## 2015-12-03 DIAGNOSIS — K219 Gastro-esophageal reflux disease without esophagitis: Secondary | ICD-10-CM

## 2015-12-03 DIAGNOSIS — I10 Essential (primary) hypertension: Secondary | ICD-10-CM

## 2015-12-03 DIAGNOSIS — R600 Localized edema: Secondary | ICD-10-CM | POA: Diagnosis not present

## 2015-12-03 DIAGNOSIS — C772 Secondary and unspecified malignant neoplasm of intra-abdominal lymph nodes: Principal | ICD-10-CM

## 2015-12-03 DIAGNOSIS — Z85038 Personal history of other malignant neoplasm of large intestine: Secondary | ICD-10-CM | POA: Diagnosis not present

## 2015-12-03 DIAGNOSIS — K7689 Other specified diseases of liver: Secondary | ICD-10-CM

## 2015-12-03 DIAGNOSIS — G8929 Other chronic pain: Secondary | ICD-10-CM

## 2015-12-03 DIAGNOSIS — M549 Dorsalgia, unspecified: Secondary | ICD-10-CM

## 2015-12-03 DIAGNOSIS — R0602 Shortness of breath: Secondary | ICD-10-CM | POA: Diagnosis not present

## 2015-12-03 DIAGNOSIS — I2510 Atherosclerotic heart disease of native coronary artery without angina pectoris: Secondary | ICD-10-CM

## 2015-12-03 MED ORDER — OXYCODONE-ACETAMINOPHEN 5-325 MG PO TABS: 1.0000 | ORAL_TABLET | Freq: Two times a day (BID) | ORAL | Status: DC | PRN

## 2015-12-03 NOTE — Progress Notes (Signed)
[facility] OFFICE PROGRESS NOTE  Patient Care Team: Jill Fenton, NP as PCP - General (Internal Medicine)   SUMMARY OF ONCOLOGIC HISTORY:  # July 2016- COLON CANCER; mod diff adeno; STAGE III (pT4aN1a; MSI-STABLE) [s/p surgery;Dr. Arbutus [place]];No adjuvant chemo sec to co-morbdities DEC 2016- CT-C/ A/P [[facility]]- subcentimeter hepatic cysts ; irregular soft tissue density in the peritoneal fat within pelvis ? Scarring  [less likely recurrence]   # July R LE DVT/Bil PE s/p IVC filter; Hx of GIB; AKI; Hx of Severe Anemia; hx CHF  INTERVAL HISTORY: Patient is a poor historian. Daughter accompanying the patient.  82 year old female patient with above history of stage III with multiple comorbidities- unable to tolerate adjuvant chemotherapy is here for follow-up/to review the results of the surveillance CAT scan.  As per the daughter patient has chronic shortness of breath which is not any worse. She is chronic swelling in the legs. She has noted that her weight has been fluctuating.   As per the daughter she also has chronic back pain radiating- right lower extremity. She has been taking hydrocodone only as needed.  REVIEW OF SYSTEMS:  A complete 10 point review of system is done which is negative except mentioned above/history of present illness.   PAST MEDICAL HISTORY :  Past Medical History  Diagnosis Date  . Colon cancer ([place])   . Coronary artery disease   . Hypertension   . Blood clot associated with vein wall inflammation   . Anemia   . DVT (deep venous thrombosis) ([place])   . PE (pulmonary embolism)   . Hyperkalemia   . GERD (gastroesophageal reflux disease)   . Vitamin D deficiency   . Renal disorder   . Gastrointestinal hemorrhage   . Malignant neoplasm of stomach ([place])   . CHF (congestive heart failure) ([place])   . Renal insufficiency   . Chicken pox   . Frequent headaches     PAST SURGICAL HISTORY :   Past Surgical History  Procedure Laterality  Date  . Ivc filter placement ([facility] hx)    . Colon surgery    . Abdominal hysterectomy    . Joint replacement Right     knee    FAMILY HISTORY :   Family History  Problem Relation Age of Onset  . Arthritis Mother   . Hypertension Mother   . Colon cancer Sister   . Heart disease Daughter   . Colon cancer Son     SOCIAL HISTORY:   Social History  Substance Use Topics  . Smoking status: Never Smoker   . Smokeless tobacco: Not on file  . Alcohol Use: No    ALLERGIES:  is allergic to nsaids; reglan; statins; and torsemide.  MEDICATIONS:  Current Outpatient Prescriptions  Medication Sig Dispense Refill  . acetaminophen (TYLENOL) 650 MG CR tablet Take 650 mg by mouth 3 (three) times daily with meals.    Marland Kitchen aspirin (ASPIRIN EC) 81 MG EC tablet Take 81 mg by mouth daily. Swallow whole.    . Cholecalciferol (VITAMIN D-3) 1000 UNITS CAPS Take 2 capsules by mouth daily.    . furosemide (LASIX) 40 MG tablet Take 1 tablet (40 mg total) by mouth 2 (two) times daily as needed. 60 tablet 6  . Magnesium Oxide 500 MG TABS Take 1 tablet by mouth 2 (two) times daily.    Marland Kitchen omeprazole (PRILOSEC) 20 MG capsule Take 20 mg by mouth daily.    . Polyethylene Glycol 3350 (MIRALAX PO)  Take by mouth daily.    Marland Kitchen spironolactone (ALDACTONE) 25 MG tablet Take 1 tablet (25 mg total) by mouth 2 (two) times daily as needed. 60 tablet 6  . vitamin C (ASCORBIC ACID) 500 MG tablet Take 500 mg by mouth daily.    Marland Kitchen oxyCODONE-acetaminophen (ROXICET) 5-325 MG tablet Take 1 tablet by mouth every 12 (twelve) hours as needed for moderate pain. (Patient not taking: Reported on 12/03/2015) 60 tablet 0   No current facility-administered medications for this visit.    PHYSICAL EXAMINATION: ECOG PERFORMANCE STATUS: 3 - Symptomatic, >50% confined to bed  BP 137/74 mmHg  Pulse 60  Temp(Src) 97.9 F (36.6 C)  Resp 18  Wt 191 lb 4 oz (86.75 kg)  Filed Weights   12/03/15 1426  Weight: 191 lb 4 oz (86.75 kg)     Accompanied by her daughter.  ENERAL: Well-nourished well-developed; Alert, no distress and comfortable. In wheel chair.  EYES: no pallor or icterus OROPHARYNX: no thrush or ulceration; good dentition  NECK: supple, no masses felt LYMPH: no palpable lymphadenopathy in the cervical, axillary or inguinal regions LUNGS: clear to auscultation and No wheeze or crackles HEART/CVS: regular rate & rhythm and no murmurs; bilateral lower extremity swelling. ABDOMEN: abdomen soft, non-tender and normal bowel sounds Musculoskeletal:no cyanosis of digits and no clubbing  PSYCH: alert & oriented x 3 with fluent speech NEURO: no focal motor/sensory deficits SKIN: no rashes or significant lesions   LABORATORY DATA:  I have reviewed the data as listed    Component Value Date/Time   NA 138 12/01/2015 1200   NA 141 09/11/2015 0920   K 4.0 12/01/2015 1200   CL 103 12/01/2015 1200   CO2 29 12/01/2015 1200   GLUCOSE 104* 12/01/2015 1200   GLUCOSE 124* 09/11/2015 0920   BUN 28* 12/01/2015 1200   BUN 25 09/11/2015 0920   CREATININE 1.08* 12/01/2015 1200   CALCIUM 9.4 12/01/2015 1200   PROT 7.4 12/01/2015 1200   ALBUMIN 4.5 12/01/2015 1200   AST 18 12/01/2015 1200   ALT 12* 12/01/2015 1200   ALKPHOS 65 12/01/2015 1200   BILITOT 0.6 12/01/2015 1200   GFRNONAA 46* 12/01/2015 1200   GFRAA 53* 12/01/2015 1200    No results found for: SPEP, UPEP  Lab Results  Component Value Date   WBC 5.2 12/01/2015   NEUTROABS 2.4 12/01/2015   HGB 12.8 12/01/2015   HCT 37.8 12/01/2015   MCV 92.0 12/01/2015   PLT 222 12/01/2015      Chemistry      Component Value Date/Time   NA 138 12/01/2015 1200   NA 141 09/11/2015 0920   K 4.0 12/01/2015 1200   CL 103 12/01/2015 1200   CO2 29 12/01/2015 1200   BUN 28* 12/01/2015 1200   BUN 25 09/11/2015 0920   CREATININE 1.08* 12/01/2015 1200      Component Value Date/Time   CALCIUM 9.4 12/01/2015 1200   ALKPHOS 65 12/01/2015 1200   AST 18 12/01/2015  1200   ALT 12* 12/01/2015 1200   BILITOT 0.6 12/01/2015 1200       ASSESSMENT & PLAN:   # Colon cancer-stage III [pT4N1a] with multiple liver cysts on the CT scan]. No adjuvant therapy given multiple comorbidities for performance status. A restaging CT scan June 2017 no evidence of recurrence. Reviewed tumor markers/CEA elevated- 10. Patient's performance status continues to be ECOG 3; reviewed with the patient and family that even though she has recurrent cancer- difficult to offer treatments based  upon her poor performance status multiple comorbidities.  # Weight gain-fluctuating- question fluid retention. Recommend follow-up with cardiology/GI regarding diuretic recommendations.  #Endometrial thickening- no vaginal bleeding. Recommend further follow up with PCP/GYN.  # Back pain radiating to the right thigh- question arthritic/nerve root impinging. Given a prescription for hydrocodone to be used sparingly. She'll follow-up with PCP regarding further recommendations.    # Patient will have blood work -CBC CMP CA in 6 months week; later CT scan chest and pelvis with contrast; follow-up with me a week later.   # 25 minutes face-to-face with the patient discussing the above plan of care; more than 50% of time spent on prognosis/ natural history; counseling and coordination.    Cammie Sickle, MD 12/03/2015 2:38 PM

## 2015-12-08 ENCOUNTER — Ambulatory Visit: Payer: Medicare Other | Admitting: Internal Medicine

## 2015-12-09 ENCOUNTER — Ambulatory Visit (INDEPENDENT_AMBULATORY_CARE_PROVIDER_SITE_OTHER): Payer: Medicare Other | Admitting: Internal Medicine

## 2015-12-09 ENCOUNTER — Encounter: Payer: Self-pay | Admitting: Internal Medicine

## 2015-12-09 ENCOUNTER — Ambulatory Visit: Payer: Medicare Other | Admitting: Internal Medicine

## 2015-12-09 VITALS — BP 114/62 | HR 68 | Temp 99.1°F | Wt 190.0 lb

## 2015-12-09 DIAGNOSIS — J309 Allergic rhinitis, unspecified: Secondary | ICD-10-CM

## 2015-12-09 DIAGNOSIS — M5412 Radiculopathy, cervical region: Secondary | ICD-10-CM | POA: Diagnosis not present

## 2015-12-09 DIAGNOSIS — R531 Weakness: Secondary | ICD-10-CM

## 2015-12-09 DIAGNOSIS — M79605 Pain in left leg: Secondary | ICD-10-CM

## 2015-12-09 DIAGNOSIS — M79604 Pain in right leg: Secondary | ICD-10-CM | POA: Diagnosis not present

## 2015-12-09 MED ORDER — HYDROCODONE-ACETAMINOPHEN 5-325 MG PO TABS: 1.0000 | ORAL_TABLET | Freq: Three times a day (TID) | ORAL | Status: DC | PRN

## 2015-12-09 NOTE — Patient Instructions (Signed)
Allergic Rhinitis Allergic rhinitis is when the mucous membranes in the nose respond to allergens. Allergens are particles in the air that cause your body to have an allergic reaction. This causes you to release allergic antibodies. Through a chain of events, these eventually cause you to release histamine into the blood stream. Although meant to protect the body, it is this release of histamine that causes your discomfort, such as frequent sneezing, congestion, and an itchy, runny nose.  CAUSES Seasonal allergic rhinitis (hay fever) is caused by pollen allergens that may come from grasses, trees, and weeds. Year-round allergic rhinitis (perennial allergic rhinitis) is caused by allergens such as house dust mites, pet dander, and mold spores. SYMPTOMS  Nasal stuffiness (congestion).  Itchy, runny nose with sneezing and tearing of the eyes. DIAGNOSIS Your health care provider can help you determine the allergen or allergens that trigger your symptoms. If you and your health care provider are unable to determine the allergen, skin or blood testing may be used. Your health care provider will diagnose your condition after taking your health history and performing a physical exam. Your health care provider may assess you for other related conditions, such as asthma, pink eye, or an ear infection. TREATMENT Allergic rhinitis does not have a cure, but it can be controlled by:  Medicines that block allergy symptoms. These may include allergy shots, nasal sprays, and oral antihistamines.  Avoiding the allergen. Hay fever may often be treated with antihistamines in pill or nasal spray forms. Antihistamines block the effects of histamine. There are over-the-counter medicines that may help with nasal congestion and swelling around the eyes. Check with your health care provider before taking or giving this medicine. If avoiding the allergen or the medicine prescribed do not work, there are many new medicines  your health care provider can prescribe. Stronger medicine may be used if initial measures are ineffective. Desensitizing injections can be used if medicine and avoidance does not work. Desensitization is when a patient is given ongoing shots until the body becomes less sensitive to the allergen. Make sure you follow up with your health care provider if problems continue. HOME CARE INSTRUCTIONS It is not possible to completely avoid allergens, but you can reduce your symptoms by taking steps to limit your exposure to them. It helps to know exactly what you are allergic to so that you can avoid your specific triggers. SEEK MEDICAL CARE IF:  You have a fever.  You develop a cough that does not stop easily (persistent).  You have shortness of breath.  You start wheezing.  Symptoms interfere with normal daily activities.   This information is not intended to replace advice given to you by your health care provider. Make sure you discuss any questions you have with your health care provider.   Document Released: 03/09/2001 Document Revised: 07/05/2014 Document Reviewed: 02/19/2013 Elsevier Interactive Patient Education 2016 Elsevier Inc.  

## 2015-12-09 NOTE — Progress Notes (Signed)
Subjective:    Patient ID: Jill Bowers, female    DOB: 21-Jun-1929, 82 y.o.   MRN: [id number]  HPI  Pt presents to the clinic today with c/o itchy eyes and runny nose. This has been going on for a few weeks. She feels like she has sand in her eyes, but she denies eye redness, discharge from her eyes or waking up with her eyes matted shut. She has been taking an OTC allergy pill with some relief.  She also c/o neck pain and right shoulder pain. This started 1-2 months ago. She describes the area as sore. The pain can sharp with certain movements. She does have associated numbness and tingling in her right arm. She denies any injury to the area. She has taken Tylenol and Percocet with some relief.  She also c/o bilateral leg pain. This has also been going on for months. The pain starts behind her knees and radiates up to her buttocks. She describes the pain as tightness. The pain can be sharp with certain movements. She walks occasionally with a walker, but is mostly sedentary. She is weak. Her daughter would like a referral for PT.  Review of Systems      Past Medical History  Diagnosis Date  . Colon cancer ([place])   . Coronary artery disease   . Hypertension   . Blood clot associated with vein wall inflammation   . Anemia   . DVT (deep venous thrombosis) ([place])   . PE (pulmonary embolism)   . Hyperkalemia   . GERD (gastroesophageal reflux disease)   . Vitamin D deficiency   . Renal disorder   . Gastrointestinal hemorrhage   . Malignant neoplasm of stomach ([place])   . CHF (congestive heart failure) ([place])   . Renal insufficiency   . Chicken pox   . Frequent headaches     Current Outpatient Prescriptions  Medication Sig Dispense Refill  . acetaminophen (TYLENOL) 650 MG CR tablet Take 650 mg by mouth 3 (three) times daily with meals.    Marland Kitchen aspirin (ASPIRIN EC) 81 MG EC tablet Take 81 mg by mouth daily. Swallow whole.    . Cholecalciferol (VITAMIN D-3) 1000 UNITS CAPS Take 2 capsules  by mouth daily.    . furosemide (LASIX) 40 MG tablet Take 1 tablet (40 mg total) by mouth 2 (two) times daily as needed. 60 tablet 6  . HYDROcodone-acetaminophen (NORCO/VICODIN) 5-325 MG tablet Take 1 tablet by mouth every 8 (eight) hours as needed for moderate pain. 90 tablet 0  . Magnesium Oxide 500 MG TABS Take 1 tablet by mouth 2 (two) times daily.    Marland Kitchen omeprazole (PRILOSEC) 20 MG capsule Take 20 mg by mouth daily.    . Polyethylene Glycol 3350 (MIRALAX PO) Take by mouth daily.    Marland Kitchen spironolactone (ALDACTONE) 25 MG tablet Take 1 tablet (25 mg total) by mouth 2 (two) times daily as needed. 60 tablet 6  . vitamin C (ASCORBIC ACID) 500 MG tablet Take 500 mg by mouth daily.     No current facility-administered medications for this visit.    Allergies  Allergen Reactions  . Nsaids Other (See Comments)  . Reglan [Metoclopramide] Other (See Comments)  . Statins Other (See Comments)  . Torsemide Other (See Comments)    Family History  Problem Relation Age of Onset  . Arthritis Mother   . Hypertension Mother   . Colon cancer Sister   . Heart disease Daughter   . Colon cancer Son  Social History   Social History  . Marital Status: Widowed    Spouse Name: N/A  . Number of Children: N/A  . Years of Education: N/A   Occupational History  . Not on file.   Social History Main Topics  . Smoking status: Never Smoker   . Smokeless tobacco: Not on file  . Alcohol Use: No  . Drug Use: No  . Sexual Activity: No   Other Topics Concern  . Not on file   Social History Narrative     Constitutional: Denies fever, malaise, fatigue, headache or abrupt weight changes.  HEENT: Pt reports itchy eyes, runny nose. Denies eye pain, eye redness, ear pain, ringing in the ears, wax buildup, nasal congestion, bloody nose, or sore throat. Respiratory: Denies difficulty breathing, shortness of breath, cough or sputum production.   Musculoskeletal: Pt reports joint pain. Denies decrease in  range of motion, difficulty with gait, muscle pain or joint swelling.  Neurological: Pt reports numbness and tingling. Denies dizziness, difficulty with memory, difficulty with speech or problems with balance and coordination.    No other specific complaints in a complete review of systems (except as listed in HPI above).  Objective:   Physical Exam  BP 114/62 mmHg  Pulse 68  Temp(Src) 99.1 F (37.3 C) (Oral)  Wt 190 lb (86.183 kg)  SpO2 97% Wt Readings from Last 3 Encounters:  12/09/15 190 lb (86.183 kg)  12/03/15 191 lb 4 oz (86.75 kg)  07/14/15 169 lb (76.658 kg)    General: Appears her stated age, chronically ill appearing, in NAD. HEENT: Head: normal shape and size; Eyes: sclera white, no icterus, conjunctiva pink; Ears: Tm's gray and intact, normal light reflex; Throat/Mouth: Teeth present, mucosa pink and moist, no exudate, lesions or ulcerations noted.   Cardiovascular: Normal rate and rhythm. S1,S2 noted.  No murmur, rubs or gallops noted.  Pulmonary/Chest: Normal effort and positive vesicular breath sounds. No respiratory distress. No wheezes, rales or ronchi noted.  Musculoskeletal: No pain with palpation over the spine. Pain with palpation of the [facility] joint, right shoulder. Pain with palpation of the trapezius. Unable to really assess lower extremities, unable to get out of the wheelchair. Neurological: Alert and oriented. Sensation intact to BUE.  BMET    Component Value Date/Time   NA 138 12/01/2015 1200   NA 141 09/11/2015 0920   K 4.0 12/01/2015 1200   CL 103 12/01/2015 1200   CO2 29 12/01/2015 1200   GLUCOSE 104* 12/01/2015 1200   GLUCOSE 124* 09/11/2015 0920   BUN 28* 12/01/2015 1200   BUN 25 09/11/2015 0920   CREATININE 1.08* 12/01/2015 1200   CALCIUM 9.4 12/01/2015 1200   GFRNONAA 46* 12/01/2015 1200   GFRAA 53* 12/01/2015 1200    Lipid Panel     Component Value Date/Time   CHOL 172 06/12/2015 1125   TRIG 155.0* 06/12/2015 1125   HDL 52.60  06/12/2015 1125   CHOLHDL 3 06/12/2015 1125   VLDL 31.0 06/12/2015 1125   LDLCALC 89 06/12/2015 1125    CBC    Component Value Date/Time   WBC 5.2 12/01/2015 1200   RBC 4.11 12/01/2015 1200   HGB 12.8 12/01/2015 1200   HCT 37.8 12/01/2015 1200   PLT 222 12/01/2015 1200   MCV 92.0 12/01/2015 1200   MCH 31.1 12/01/2015 1200   MCHC 33.8 12/01/2015 1200   RDW 13.6 12/01/2015 1200   LYMPHSABS 2.2 12/01/2015 1200   MONOABS 0.4 12/01/2015 1200   EOSABS 0.1 12/01/2015  1200   BASOSABS 0.0 12/01/2015 1200    Hgb A1C Lab Results  Component Value Date   HGBA1C 5.8 06/12/2015         Assessment & Plan:   Allergic Rhinitis:  Continue antihistamine Start saline eye drops OTC Start Flonase OTC  Cervical Radiculitis:  Discussed with daughter Will treat conservatively, hold off on xray and MRI RX for Norco TID prn, stop Percocet  Bilateral leg pain and weakness:  Likely due to being sedentary Will refer to PT for strengthening exercises  RTC as needed

## 2015-12-11 DIAGNOSIS — C772 Secondary and unspecified malignant neoplasm of intra-abdominal lymph nodes: Secondary | ICD-10-CM | POA: Diagnosis not present

## 2015-12-11 DIAGNOSIS — I739 Peripheral vascular disease, unspecified: Secondary | ICD-10-CM | POA: Diagnosis not present

## 2015-12-11 DIAGNOSIS — C189 Malignant neoplasm of colon, unspecified: Secondary | ICD-10-CM | POA: Diagnosis not present

## 2015-12-11 DIAGNOSIS — R531 Weakness: Secondary | ICD-10-CM | POA: Diagnosis not present

## 2015-12-11 DIAGNOSIS — I509 Heart failure, unspecified: Secondary | ICD-10-CM | POA: Diagnosis not present

## 2015-12-11 DIAGNOSIS — Z86718 Personal history of other venous thrombosis and embolism: Secondary | ICD-10-CM | POA: Diagnosis not present

## 2015-12-11 DIAGNOSIS — Z86711 Personal history of pulmonary embolism: Secondary | ICD-10-CM | POA: Diagnosis not present

## 2015-12-11 DIAGNOSIS — I110 Hypertensive heart disease with heart failure: Secondary | ICD-10-CM | POA: Diagnosis not present

## 2015-12-11 DIAGNOSIS — Z7982 Long term (current) use of aspirin: Secondary | ICD-10-CM | POA: Diagnosis not present

## 2015-12-15 DIAGNOSIS — I739 Peripheral vascular disease, unspecified: Secondary | ICD-10-CM | POA: Diagnosis not present

## 2015-12-15 DIAGNOSIS — I110 Hypertensive heart disease with heart failure: Secondary | ICD-10-CM | POA: Diagnosis not present

## 2015-12-15 DIAGNOSIS — C772 Secondary and unspecified malignant neoplasm of intra-abdominal lymph nodes: Secondary | ICD-10-CM | POA: Diagnosis not present

## 2015-12-15 DIAGNOSIS — R531 Weakness: Secondary | ICD-10-CM | POA: Diagnosis not present

## 2015-12-15 DIAGNOSIS — C189 Malignant neoplasm of colon, unspecified: Secondary | ICD-10-CM | POA: Diagnosis not present

## 2015-12-15 DIAGNOSIS — I509 Heart failure, unspecified: Secondary | ICD-10-CM | POA: Diagnosis not present

## 2015-12-19 DIAGNOSIS — C772 Secondary and unspecified malignant neoplasm of intra-abdominal lymph nodes: Secondary | ICD-10-CM | POA: Diagnosis not present

## 2015-12-19 DIAGNOSIS — C189 Malignant neoplasm of colon, unspecified: Secondary | ICD-10-CM | POA: Diagnosis not present

## 2015-12-19 DIAGNOSIS — I739 Peripheral vascular disease, unspecified: Secondary | ICD-10-CM | POA: Diagnosis not present

## 2015-12-19 DIAGNOSIS — I110 Hypertensive heart disease with heart failure: Secondary | ICD-10-CM | POA: Diagnosis not present

## 2015-12-19 DIAGNOSIS — R531 Weakness: Secondary | ICD-10-CM | POA: Diagnosis not present

## 2015-12-19 DIAGNOSIS — I509 Heart failure, unspecified: Secondary | ICD-10-CM | POA: Diagnosis not present

## 2015-12-31 DIAGNOSIS — I110 Hypertensive heart disease with heart failure: Secondary | ICD-10-CM | POA: Diagnosis not present

## 2015-12-31 DIAGNOSIS — I509 Heart failure, unspecified: Secondary | ICD-10-CM | POA: Diagnosis not present

## 2015-12-31 DIAGNOSIS — R531 Weakness: Secondary | ICD-10-CM | POA: Diagnosis not present

## 2015-12-31 DIAGNOSIS — C189 Malignant neoplasm of colon, unspecified: Secondary | ICD-10-CM | POA: Diagnosis not present

## 2015-12-31 DIAGNOSIS — I739 Peripheral vascular disease, unspecified: Secondary | ICD-10-CM | POA: Diagnosis not present

## 2015-12-31 DIAGNOSIS — C772 Secondary and unspecified malignant neoplasm of intra-abdominal lymph nodes: Secondary | ICD-10-CM | POA: Diagnosis not present

## 2016-01-02 DIAGNOSIS — C772 Secondary and unspecified malignant neoplasm of intra-abdominal lymph nodes: Secondary | ICD-10-CM | POA: Diagnosis not present

## 2016-01-02 DIAGNOSIS — I110 Hypertensive heart disease with heart failure: Secondary | ICD-10-CM | POA: Diagnosis not present

## 2016-01-02 DIAGNOSIS — C189 Malignant neoplasm of colon, unspecified: Secondary | ICD-10-CM | POA: Diagnosis not present

## 2016-01-02 DIAGNOSIS — I509 Heart failure, unspecified: Secondary | ICD-10-CM | POA: Diagnosis not present

## 2016-01-02 DIAGNOSIS — I739 Peripheral vascular disease, unspecified: Secondary | ICD-10-CM | POA: Diagnosis not present

## 2016-01-02 DIAGNOSIS — R531 Weakness: Secondary | ICD-10-CM | POA: Diagnosis not present

## 2016-01-05 DIAGNOSIS — R531 Weakness: Secondary | ICD-10-CM | POA: Diagnosis not present

## 2016-01-05 DIAGNOSIS — C189 Malignant neoplasm of colon, unspecified: Secondary | ICD-10-CM | POA: Diagnosis not present

## 2016-01-05 DIAGNOSIS — I509 Heart failure, unspecified: Secondary | ICD-10-CM | POA: Diagnosis not present

## 2016-01-05 DIAGNOSIS — I110 Hypertensive heart disease with heart failure: Secondary | ICD-10-CM | POA: Diagnosis not present

## 2016-01-05 DIAGNOSIS — I739 Peripheral vascular disease, unspecified: Secondary | ICD-10-CM | POA: Diagnosis not present

## 2016-01-05 DIAGNOSIS — C772 Secondary and unspecified malignant neoplasm of intra-abdominal lymph nodes: Secondary | ICD-10-CM | POA: Diagnosis not present

## 2016-01-06 ENCOUNTER — Ambulatory Visit (INDEPENDENT_AMBULATORY_CARE_PROVIDER_SITE_OTHER)
Admission: RE | Admit: 2016-01-06 | Discharge: 2016-01-06 | Disposition: A | Discharge status: Home / Self Care | Payer: Medicare Other | Source: Ambulatory Visit | Attending: Internal Medicine | Admitting: Internal Medicine

## 2016-01-06 ENCOUNTER — Ambulatory Visit: Payer: Medicare Other | Admitting: Internal Medicine

## 2016-01-06 ENCOUNTER — Encounter: Payer: Self-pay | Admitting: Internal Medicine

## 2016-01-06 ENCOUNTER — Ambulatory Visit
Admission: RE | Admit: 2016-01-06 | Discharge: 2016-01-06 | Disposition: A | Discharge status: Home / Self Care | Payer: Medicare Other | Source: Ambulatory Visit | Attending: Internal Medicine | Admitting: Internal Medicine

## 2016-01-06 ENCOUNTER — Ambulatory Visit (INDEPENDENT_AMBULATORY_CARE_PROVIDER_SITE_OTHER): Payer: Medicare Other | Admitting: Internal Medicine

## 2016-01-06 VITALS — BP 130/72 | HR 76 | Temp 99.6°F | Wt 196.0 lb

## 2016-01-06 DIAGNOSIS — M25562 Pain in left knee: Secondary | ICD-10-CM

## 2016-01-06 DIAGNOSIS — M25561 Pain in right knee: Secondary | ICD-10-CM

## 2016-01-06 MED ORDER — HYDROCODONE-ACETAMINOPHEN 7.5-325 MG PO TABS: 1.0000 | ORAL_TABLET | Freq: Four times a day (QID) | ORAL | Status: DC | PRN

## 2016-01-06 NOTE — Patient Instructions (Signed)

## 2016-01-06 NOTE — Progress Notes (Signed)
Pre visit review using our clinic review tool, if applicable. No additional management support is needed unless otherwise documented below in the visit note. 

## 2016-01-06 NOTE — Addendum Note (Signed)
Addended by: Ellamae Sia on: 01/06/2016 04:53 PM   Modules accepted: Orders

## 2016-01-06 NOTE — Progress Notes (Signed)
Subjective:    Patient ID: Jill Bowers, female    DOB: 11/27/1928, 82 y.o.   MRN: [id number]  HPI  Pt presents to the clinic today with c/o bilateral knee pain. This has been going on for a few months. She describes the pain as constant and achy. It seems worse at night, and keeps her awake. She has been taking Norco every 8 hours but hasn't noticed that it has helped. She is unable to take NSAID's due to history of stomach cancer. She has been working with PT on strengthening and they suggested she see an orthopedist. She has had a right TKR.  Review of Systems  Past Medical History  Diagnosis Date  . Colon cancer ([place])   . Coronary artery disease   . Hypertension   . Blood clot associated with vein wall inflammation   . Anemia   . DVT (deep venous thrombosis) ([place])   . PE (pulmonary embolism)   . Hyperkalemia   . GERD (gastroesophageal reflux disease)   . Vitamin D deficiency   . Renal disorder   . Gastrointestinal hemorrhage   . Malignant neoplasm of stomach ([place])   . CHF (congestive heart failure) ([place])   . Renal insufficiency   . Chicken pox   . Frequent headaches     Current Outpatient Prescriptions  Medication Sig Dispense Refill  . acetaminophen (TYLENOL) 650 MG CR tablet Take 650 mg by mouth 3 (three) times daily with meals.    Marland Kitchen aspirin (ASPIRIN EC) 81 MG EC tablet Take 81 mg by mouth daily. Swallow whole.    . Cholecalciferol (VITAMIN D-3) 1000 UNITS CAPS Take 2 capsules by mouth daily.    . furosemide (LASIX) 40 MG tablet Take 1 tablet (40 mg total) by mouth 2 (two) times daily as needed. 60 tablet 6  . HYDROcodone-acetaminophen (NORCO/VICODIN) 5-325 MG tablet Take 1 tablet by mouth every 8 (eight) hours as needed for moderate pain. 90 tablet 0  . Magnesium Oxide 500 MG TABS Take 1 tablet by mouth 2 (two) times daily.    Marland Kitchen omeprazole (PRILOSEC) 20 MG capsule Take 20 mg by mouth daily.    . Polyethylene Glycol 3350 (MIRALAX PO) Take by mouth daily.    Marland Kitchen  spironolactone (ALDACTONE) 25 MG tablet Take 1 tablet (25 mg total) by mouth 2 (two) times daily as needed. 60 tablet 6  . vitamin C (ASCORBIC ACID) 500 MG tablet Take 500 mg by mouth daily.     No current facility-administered medications for this visit.    Allergies  Allergen Reactions  . Nsaids Other (See Comments)  . Reglan [Metoclopramide] Other (See Comments)  . Statins Other (See Comments)  . Torsemide Other (See Comments)    Family History  Problem Relation Age of Onset  . Arthritis Mother   . Hypertension Mother   . Colon cancer Sister   . Heart disease Daughter   . Colon cancer Son     Social History   Social History  . Marital Status: Widowed    Spouse Name: N/A  . Number of Children: N/A  . Years of Education: N/A   Occupational History  . Not on file.   Social History Main Topics  . Smoking status: Never Smoker   . Smokeless tobacco: Not on file  . Alcohol Use: No  . Drug Use: No  . Sexual Activity: No   Other Topics Concern  . Not on file   Social History Narrative    Musculoskeletal:  Pt reports bilateral knee pain, difficulty with gait. Denies muscle pain.  Neurological: Pt has problems with balance and coordination. Denies dizziness, difficulty with memory, difficulty with speech.    No other specific complaints in a complete review of systems (except as listed in HPI above).     Objective:   Physical Exam   BP 130/72 mmHg  Pulse 76  Temp(Src) 99.6 F (37.6 C) (Oral)  Wt 196 lb (88.905 kg)  SpO2 96% Wt Readings from Last 3 Encounters:  01/06/16 196 lb (88.905 kg)  12/09/15 190 lb (86.183 kg)  12/03/15 191 lb 4 oz (86.75 kg)    General: Appears her stated age, obese chronically ill appearing, in NAD. Musculoskeletal: Unable to assess ROM in wheelchair. Joint swelling noted bilaterally. Pain with palpation of the joint lines bilaterally. Neurological: Alert. Sensation intact to BLE.  BMET    Component Value Date/Time   NA 138  12/01/2015 1200   NA 141 09/11/2015 0920   K 4.0 12/01/2015 1200   CL 103 12/01/2015 1200   CO2 29 12/01/2015 1200   GLUCOSE 104* 12/01/2015 1200   GLUCOSE 124* 09/11/2015 0920   BUN 28* 12/01/2015 1200   BUN 25 09/11/2015 0920   CREATININE 1.08* 12/01/2015 1200   CALCIUM 9.4 12/01/2015 1200   GFRNONAA 46* 12/01/2015 1200   GFRAA 53* 12/01/2015 1200    Lipid Panel     Component Value Date/Time   CHOL 172 06/12/2015 1125   TRIG 155.0* 06/12/2015 1125   HDL 52.60 06/12/2015 1125   CHOLHDL 3 06/12/2015 1125   VLDL 31.0 06/12/2015 1125   LDLCALC 89 06/12/2015 1125    CBC    Component Value Date/Time   WBC 5.2 12/01/2015 1200   RBC 4.11 12/01/2015 1200   HGB 12.8 12/01/2015 1200   HCT 37.8 12/01/2015 1200   PLT 222 12/01/2015 1200   MCV 92.0 12/01/2015 1200   MCH 31.1 12/01/2015 1200   MCHC 33.8 12/01/2015 1200   RDW 13.6 12/01/2015 1200   LYMPHSABS 2.2 12/01/2015 1200   MONOABS 0.4 12/01/2015 1200   EOSABS 0.1 12/01/2015 1200   BASOSABS 0.0 12/01/2015 1200    Hgb A1C Lab Results  Component Value Date   HGBA1C 5.8 06/12/2015        Assessment & Plan:   Bilateral knee pain:  Will xray knees today Increase Norco to 7.5-325 mg every 8 hours No NSAID's Continue to work with PT Will refer to ortho to discuss injections  RTC as needed or if symptoms persist or worsen Audi Wettstein, NP

## 2016-01-07 ENCOUNTER — Other Ambulatory Visit: Payer: Medicare Other

## 2016-01-07 ENCOUNTER — Ambulatory Visit: Payer: Medicare Other | Admitting: Internal Medicine

## 2016-01-07 DIAGNOSIS — M179 Osteoarthritis of knee, unspecified: Secondary | ICD-10-CM | POA: Diagnosis not present

## 2016-01-08 ENCOUNTER — Ambulatory Visit: Payer: Medicare Other | Admitting: Internal Medicine

## 2016-01-08 DIAGNOSIS — C772 Secondary and unspecified malignant neoplasm of intra-abdominal lymph nodes: Secondary | ICD-10-CM | POA: Diagnosis not present

## 2016-01-08 DIAGNOSIS — I509 Heart failure, unspecified: Secondary | ICD-10-CM | POA: Diagnosis not present

## 2016-01-08 DIAGNOSIS — R531 Weakness: Secondary | ICD-10-CM | POA: Diagnosis not present

## 2016-01-08 DIAGNOSIS — I739 Peripheral vascular disease, unspecified: Secondary | ICD-10-CM | POA: Diagnosis not present

## 2016-01-08 DIAGNOSIS — I110 Hypertensive heart disease with heart failure: Secondary | ICD-10-CM | POA: Diagnosis not present

## 2016-01-08 DIAGNOSIS — C189 Malignant neoplasm of colon, unspecified: Secondary | ICD-10-CM | POA: Diagnosis not present

## 2016-01-13 DIAGNOSIS — M1712 Unilateral primary osteoarthritis, left knee: Secondary | ICD-10-CM | POA: Diagnosis not present

## 2016-01-13 DIAGNOSIS — M25561 Pain in right knee: Secondary | ICD-10-CM | POA: Diagnosis not present

## 2016-01-13 DIAGNOSIS — Z96651 Presence of right artificial knee joint: Secondary | ICD-10-CM | POA: Diagnosis not present

## 2016-01-13 DIAGNOSIS — M25361 Other instability, right knee: Secondary | ICD-10-CM | POA: Diagnosis not present

## 2016-01-13 DIAGNOSIS — M25562 Pain in left knee: Secondary | ICD-10-CM | POA: Diagnosis not present

## 2016-01-15 DIAGNOSIS — C189 Malignant neoplasm of colon, unspecified: Secondary | ICD-10-CM | POA: Diagnosis not present

## 2016-01-15 DIAGNOSIS — C772 Secondary and unspecified malignant neoplasm of intra-abdominal lymph nodes: Secondary | ICD-10-CM | POA: Diagnosis not present

## 2016-01-15 DIAGNOSIS — R531 Weakness: Secondary | ICD-10-CM | POA: Diagnosis not present

## 2016-01-15 DIAGNOSIS — I509 Heart failure, unspecified: Secondary | ICD-10-CM | POA: Diagnosis not present

## 2016-01-15 DIAGNOSIS — I110 Hypertensive heart disease with heart failure: Secondary | ICD-10-CM | POA: Diagnosis not present

## 2016-01-15 DIAGNOSIS — I739 Peripheral vascular disease, unspecified: Secondary | ICD-10-CM | POA: Diagnosis not present

## 2016-01-16 DIAGNOSIS — R531 Weakness: Secondary | ICD-10-CM | POA: Diagnosis not present

## 2016-01-16 DIAGNOSIS — I739 Peripheral vascular disease, unspecified: Secondary | ICD-10-CM | POA: Diagnosis not present

## 2016-01-16 DIAGNOSIS — C189 Malignant neoplasm of colon, unspecified: Secondary | ICD-10-CM | POA: Diagnosis not present

## 2016-01-16 DIAGNOSIS — C772 Secondary and unspecified malignant neoplasm of intra-abdominal lymph nodes: Secondary | ICD-10-CM | POA: Diagnosis not present

## 2016-01-16 DIAGNOSIS — I110 Hypertensive heart disease with heart failure: Secondary | ICD-10-CM | POA: Diagnosis not present

## 2016-01-16 DIAGNOSIS — I509 Heart failure, unspecified: Secondary | ICD-10-CM | POA: Diagnosis not present

## 2016-01-19 ENCOUNTER — Other Ambulatory Visit: Payer: Medicare Other

## 2016-01-20 DIAGNOSIS — C772 Secondary and unspecified malignant neoplasm of intra-abdominal lymph nodes: Secondary | ICD-10-CM | POA: Diagnosis not present

## 2016-01-20 DIAGNOSIS — I110 Hypertensive heart disease with heart failure: Secondary | ICD-10-CM | POA: Diagnosis not present

## 2016-01-20 DIAGNOSIS — I739 Peripheral vascular disease, unspecified: Secondary | ICD-10-CM | POA: Diagnosis not present

## 2016-01-20 DIAGNOSIS — I509 Heart failure, unspecified: Secondary | ICD-10-CM | POA: Diagnosis not present

## 2016-01-20 DIAGNOSIS — R531 Weakness: Secondary | ICD-10-CM | POA: Diagnosis not present

## 2016-01-20 DIAGNOSIS — C189 Malignant neoplasm of colon, unspecified: Secondary | ICD-10-CM | POA: Diagnosis not present

## 2016-01-29 ENCOUNTER — Encounter: Payer: Self-pay | Admitting: Cardiovascular Disease

## 2016-01-29 ENCOUNTER — Ambulatory Visit (INDEPENDENT_AMBULATORY_CARE_PROVIDER_SITE_OTHER): Payer: Medicare Other | Admitting: Cardiovascular Disease

## 2016-01-29 VITALS — BP 130/60 | HR 67 | Ht 63.0 in | Wt 192.0 lb

## 2016-01-29 DIAGNOSIS — I10 Essential (primary) hypertension: Secondary | ICD-10-CM | POA: Diagnosis not present

## 2016-01-29 DIAGNOSIS — I509 Heart failure, unspecified: Secondary | ICD-10-CM

## 2016-01-29 DIAGNOSIS — R600 Localized edema: Secondary | ICD-10-CM

## 2016-01-29 DIAGNOSIS — Z86718 Personal history of other venous thrombosis and embolism: Secondary | ICD-10-CM | POA: Diagnosis not present

## 2016-01-29 DIAGNOSIS — R001 Bradycardia, unspecified: Secondary | ICD-10-CM | POA: Diagnosis not present

## 2016-01-29 NOTE — Patient Instructions (Signed)
Medication Instructions:   No changes  Labwork:  No new labs  Testing/Procedures:  No new testing   Follow-Up: It was a pleasure seeing you in the office today. Please call us if you have new issues that need to be addressed before your next appt.  [phone]  Your physician wants you to follow-up in: 6 months.  You will receive a reminder letter in the mail two months in advance. If you don't receive a letter, please call our office to schedule the follow-up appointment.  If you need a refill on your cardiac medications before your next appointment, please call your pharmacy.

## 2016-01-29 NOTE — Progress Notes (Signed)
Cardiology Office Note  Date:  01/29/2016   ID:  Alyscia Beeck, DOB 1928-07-11, MRN [id number]  PCP:  Webb Silversmith, NP   Chief Complaint  Patient presents with  . Other    6 month f/u c/o sob and edema. Meds reviewed verbally with pt.    HPI:  [name] is a pleasant 82 year old woman With history of bradycardia , recently hospitalized at [facility]  For bradycardia,  At the time of discharged was referred to our office   history of DVT, PE, IVC filter in place, prior rectal bleeding, not on anticoagulation  Notes indicate history of anemia,  History of colon cancer, followed by oncology, stage III she presents today for follow-up of her bradycardia  In follow-up today, she reports that she is doing well Denies any problems from bradycardia Legs are weak, trying to do exercises Recent seen by orthopedics, had cortisone shot in one knee, brace on the other side Presents today in a wheelchair, daughter brings her Takes lasix 40 daily, Sometimes extra in afternoon (rare) No significant leg edema Anemia resolved on recent lab work, hemoglobin above 12 BMP good, not dehyrated HA1C 5.8 Total chol 172, LDL 89  EKG shows normal sinus rhythm with rate 67 bpm T-wave abnormality 1 and aVL  Other past medical history reviewed  she currently lives at home with her daughter , previously was living in [place]  She was working with home health, PT.  They noted low heart rate at home  was recently evaluated in the hospital 07/02/2015 for heart rate in the 40s.  Also was reported to have low blood pressure.   Echocardiogram in the hospital reviewed, normal ejection fraction,  Mention of posterior pericardial effusion  carvedilol was held, she had improvement of her heart rate , discharged with follow-up in clinic today   PMH:   has a past medical history of Anemia; Blood clot associated with vein wall inflammation; CHF (congestive heart failure) ([place]); Chicken pox; Colon cancer ([place]);  Coronary artery disease; DVT (deep venous thrombosis) ([place]); Frequent headaches; Gastrointestinal hemorrhage; GERD (gastroesophageal reflux disease); Hyperkalemia; Hypertension; Malignant neoplasm of stomach ([place]); PE (pulmonary embolism); Renal disorder; Renal insufficiency; and Vitamin D deficiency.  PSH:    Past Surgical History:  Procedure Laterality Date  . ABDOMINAL HYSTERECTOMY    . COLON SURGERY    . IVC FILTER PLACEMENT ([facility] HX)    . JOINT REPLACEMENT Right    knee    Current Outpatient Prescriptions  Medication Sig Dispense Refill  . acetaminophen (TYLENOL) 650 MG CR tablet Take 650 mg by mouth 3 (three) times daily with meals.    Marland Kitchen aspirin (ASPIRIN EC) 81 MG EC tablet Take 81 mg by mouth daily. Swallow whole.    . Cholecalciferol (VITAMIN D-3) 1000 UNITS CAPS Take 2 capsules by mouth daily.    . furosemide (LASIX) 40 MG tablet Take 1 tablet (40 mg total) by mouth 2 (two) times daily as needed. 60 tablet 6  . HYDROcodone-acetaminophen (NORCO) 7.5-325 MG tablet Take 1 tablet by mouth every 6 (six) hours as needed for moderate pain. 90 tablet 0  . Magnesium Oxide 500 MG TABS Take 1 tablet by mouth 2 (two) times daily.    Marland Kitchen omeprazole (PRILOSEC) 20 MG capsule Take 20 mg by mouth daily.    . Polyethylene Glycol 3350 (MIRALAX PO) Take by mouth daily.    Marland Kitchen spironolactone (ALDACTONE) 25 MG tablet Take 1 tablet (25 mg total) by mouth 2 (two) times daily  as needed. 60 tablet 6  . vitamin C (ASCORBIC ACID) 500 MG tablet Take 500 mg by mouth daily.     No current facility-administered medications for this visit.      Allergies:   Nsaids; Reglan [metoclopramide]; Statins; and Torsemide   Social History:  The patient  reports that she has never smoked. She does not have any smokeless tobacco history on file. She reports that she does not drink alcohol or use drugs.   Family History:   family history includes Arthritis in her mother; Colon cancer in her sister and son; Heart disease in  her daughter; Hypertension in her mother.    Review of Systems: Review of Systems  Constitutional: Negative.   Respiratory: Negative.   Cardiovascular: Negative.   Gastrointestinal: Negative.   Musculoskeletal: Positive for joint pain.       Gait instability  Neurological: Negative.   Psychiatric/Behavioral: Negative.   All other systems reviewed and are negative.    PHYSICAL EXAM: VS:  BP 130/60 (BP Location: Right Arm, Patient Position: Sitting, Cuff Size: Large)   Pulse 67   Ht 5\' 3"  (1.6 m)   Wt 192 lb (87.1 kg)   BMI 34.01 kg/m  , BMI Body mass index is 34.01 kg/m. GEN: Well nourished, well developed, in no acute distress  HEENT: normal  Neck: no JVD, carotid bruits, or masses Cardiac: RRR; no murmurs, rubs, or gallops,no edema  Respiratory:  clear to auscultation bilaterally, normal work of breathing GI: soft, nontender, nondistended, + BS MS: no deformity or atrophy  Skin: warm and dry, no rash Neuro:  Strength and sensation are intact Psych: euthymic mood, full affect    Recent Labs: 05/29/2015: B Natriuretic Peptide 139.0 06/12/2015: Pro B Natriuretic peptide (BNP) 255.0 07/02/2015: Magnesium 2.2 12/01/2015: ALT 12; BUN 28; Creatinine, Ser 1.08; Hemoglobin 12.8; Platelets 222; Potassium 4.0; Sodium 138    Lipid Panel Lab Results  Component Value Date   CHOL 172 06/12/2015   HDL 52.60 06/12/2015   LDLCALC 89 06/12/2015   TRIG 155.0 (H) 06/12/2015      Wt Readings from Last 3 Encounters:  01/29/16 192 lb (87.1 kg)  01/06/16 196 lb (88.9 kg)  12/09/15 190 lb (86.2 kg)       ASSESSMENT AND PLAN:  Chronic congestive heart failure, unspecified congestive heart failure type ([place]) - Plan: EKG 12-Lead Appears relatively euvolemic Review of lab work shows stable renal function We'll continue with Lasix daily Potassium stable on Aldactone  Essential hypertension Blood pressure is well controlled on today's visit. No changes made to the  medications.  History of DVT Recommended she stay active, consider compression hose  Bradycardia Beta blocker previously held, symptoms resolved, heart rate in the 60s  Bilateral edema of lower extremity Edema has essentially resolved, Likely combination of anemia improved, more active Previously felt to have a component of venous insufficiency  Long discussion with patient and daughter concerning cardiac risk factors Cholesterol addressed, diabetes numbers, nonsmoker  Total encounter time more than 25 minutes  Greater than 50% was spent in counseling and coordination of care with the patient   Disposition:   F/U  6 months   Orders Placed This Encounter  Procedures  . EKG 12-Lead     Signed, Esmond Plants, M.D., Ph.D. 01/29/2016  [place], [place]

## 2016-02-02 ENCOUNTER — Ambulatory Visit: Payer: Medicare Other | Admitting: Internal Medicine

## 2016-02-11 DIAGNOSIS — M1712 Unilateral primary osteoarthritis, left knee: Secondary | ICD-10-CM | POA: Diagnosis not present

## 2016-02-27 DIAGNOSIS — M1712 Unilateral primary osteoarthritis, left knee: Secondary | ICD-10-CM | POA: Diagnosis not present

## 2016-03-10 ENCOUNTER — Other Ambulatory Visit: Payer: Self-pay | Admitting: Cardiovascular Disease

## 2016-03-10 ENCOUNTER — Ambulatory Visit (INDEPENDENT_AMBULATORY_CARE_PROVIDER_SITE_OTHER): Payer: Medicare Other

## 2016-03-10 DIAGNOSIS — Z23 Encounter for immunization: Secondary | ICD-10-CM

## 2016-03-26 ENCOUNTER — Telehealth: Payer: Self-pay | Admitting: Cardiovascular Disease

## 2016-03-26 NOTE — Telephone Encounter (Signed)
Pt daughter calling stating she is retaining fluid  Pt c/o swelling: STAT is pt has developed SOB within 24 hours  1. How long have you been experiencing swelling? A week, today it is really bad   2. Where is the swelling located? Feet and legs  3.  Are you currently taking a "fluid pill"? Yes 2 times a day   4.  Are you currently SOB? A lot more than normal  5.  Have you traveled recently? No

## 2016-03-26 NOTE — Telephone Encounter (Signed)
Spoke w/ [name].  She reports that pt's wt is up approx 10 lbs in 1 week. Pt drinks a considerable amount of fluid and does not follow low sodium diet. Pt primarily eats a home, but eats whatever she wants. Pt has compression hose, but does not wear. Only takes lasix prn. She reports that pt is more SOB this am and has ankle edema. Advised her to have pt limit her fluids - only drink when she is thirsty, try ice chips if her mouth is dry to avoid the volume of liquid - limit her sodium intake, wear compression hose and keep feet elevated when sitting. Advised her to have pt take her lasix 40 mg BID and call me on Monday. Advised her to call 911 or proceed to the ED if pt's sx become emergent. She is appreciative and will call on Monday if sx have not improved.

## 2016-04-07 ENCOUNTER — Telehealth: Payer: Self-pay

## 2016-04-07 MED ORDER — HYDROCODONE-ACETAMINOPHEN 7.5-325 MG PO TABS: 1.0000 | ORAL_TABLET | Freq: Four times a day (QID) | ORAL | 0 refills | Status: DC | PRN

## 2016-04-07 NOTE — Telephone Encounter (Signed)
Patient's daughter called to find out if rx is ready for pick up. Please call patient's daughter,[name],at [phone].

## 2016-04-07 NOTE — Telephone Encounter (Signed)
RX printed and signed and placed in MYD box 

## 2016-04-07 NOTE — Telephone Encounter (Signed)
Pt left v/m requesting rx hydrocodone apap. Call when ready for pick up. Pt last seen and rx last printed # 90 on 01/06/16.

## 2016-04-08 NOTE — Telephone Encounter (Signed)
Jill Bowers called checking on rx Best number [phone]

## 2016-04-08 NOTE — Telephone Encounter (Signed)
Left detailed msg on VM per HIPAA Rx left in front office for pick up  

## 2016-05-19 ENCOUNTER — Inpatient Hospital Stay: Payer: Medicare Other

## 2016-05-24 ENCOUNTER — Inpatient Hospital Stay: Payer: Medicare Other

## 2016-05-24 ENCOUNTER — Inpatient Hospital Stay: Payer: Medicare Other | Attending: Internal Medicine

## 2016-05-24 DIAGNOSIS — C189 Malignant neoplasm of colon, unspecified: Secondary | ICD-10-CM | POA: Diagnosis not present

## 2016-05-24 DIAGNOSIS — C772 Secondary and unspecified malignant neoplasm of intra-abdominal lymph nodes: Secondary | ICD-10-CM

## 2016-05-24 LAB — CBC WITH DIFFERENTIAL/PLATELET
BASOS ABS: 0.1 10*3/uL (ref 0–0.1)
BASOS PCT: 1 %
Eosinophils Absolute: 0.1 10*3/uL (ref 0–0.7)
Eosinophils Relative: 2 %
HEMATOCRIT: 37.8 % (ref 35.0–47.0)
HEMOGLOBIN: 12.6 g/dL (ref 12.0–16.0)
LYMPHS PCT: 46 %
Lymphs Abs: 2.3 10*3/uL (ref 1.0–3.6)
MCH: 30.5 pg (ref 26.0–34.0)
MCHC: 33.5 g/dL (ref 32.0–36.0)
MCV: 91.2 fL (ref 80.0–100.0)
MONO ABS: 0.3 10*3/uL (ref 0.2–0.9)
Monocytes Relative: 7 %
NEUTROS ABS: 2.2 10*3/uL (ref 1.4–6.5)
NEUTROS PCT: 44 %
Platelets: 210 10*3/uL (ref 150–440)
RBC: 4.14 MIL/uL (ref 3.80–5.20)
RDW: 13.6 % (ref 11.5–14.5)
WBC: 5 10*3/uL (ref 3.6–11.0)

## 2016-05-24 LAB — COMPREHENSIVE METABOLIC PANEL
ALBUMIN: 4.3 g/dL (ref 3.5–5.0)
ALT: 10 U/L — AB (ref 14–54)
AST: 18 U/L (ref 15–41)
Alkaline Phosphatase: 58 U/L (ref 38–126)
Anion gap: 10 (ref 5–15)
BILIRUBIN TOTAL: 0.5 mg/dL (ref 0.3–1.2)
BUN: 20 mg/dL (ref 6–20)
CO2: 29 mmol/L (ref 22–32)
CREATININE: 1.18 mg/dL — AB (ref 0.44–1.00)
Calcium: 9.5 mg/dL (ref 8.9–10.3)
Chloride: 100 mmol/L — ABNORMAL LOW (ref 101–111)
GFR calc Af Amer: 47 mL/min — ABNORMAL LOW (ref >60)
GFR, EST NON AFRICAN AMERICAN: 41 mL/min — AB (ref >60)
GLUCOSE: 99 mg/dL (ref 65–99)
POTASSIUM: 4 mmol/L (ref 3.5–5.1)
Sodium: 139 mmol/L (ref 135–145)
TOTAL PROTEIN: 7.3 g/dL (ref 6.5–8.1)

## 2016-05-25 LAB — CEA: CEA: 20.2 ng/mL — AB (ref 0.0–4.7)

## 2016-05-27 ENCOUNTER — Ambulatory Visit: Admission: RE | Admit: 2016-05-27 | Payer: Medicare Other | Source: Ambulatory Visit

## 2016-06-02 ENCOUNTER — Ambulatory Visit
Admission: RE | Admit: 2016-06-02 | Discharge: 2016-06-02 | Disposition: A | Discharge status: Home / Self Care | Payer: Medicare Other | Source: Ambulatory Visit | Attending: Internal Medicine | Admitting: Internal Medicine

## 2016-06-02 DIAGNOSIS — K7689 Other specified diseases of liver: Secondary | ICD-10-CM | POA: Diagnosis not present

## 2016-06-02 DIAGNOSIS — J9811 Atelectasis: Secondary | ICD-10-CM | POA: Diagnosis not present

## 2016-06-02 DIAGNOSIS — I708 Atherosclerosis of other arteries: Secondary | ICD-10-CM | POA: Diagnosis not present

## 2016-06-02 DIAGNOSIS — I700 Atherosclerosis of aorta: Secondary | ICD-10-CM | POA: Insufficient documentation

## 2016-06-02 DIAGNOSIS — M5136 Other intervertebral disc degeneration, lumbar region: Secondary | ICD-10-CM | POA: Insufficient documentation

## 2016-06-02 DIAGNOSIS — C189 Malignant neoplasm of colon, unspecified: Secondary | ICD-10-CM | POA: Diagnosis not present

## 2016-06-02 DIAGNOSIS — C772 Secondary and unspecified malignant neoplasm of intra-abdominal lymph nodes: Secondary | ICD-10-CM | POA: Diagnosis not present

## 2016-06-02 DIAGNOSIS — K5730 Diverticulosis of large intestine without perforation or abscess without bleeding: Secondary | ICD-10-CM | POA: Diagnosis not present

## 2016-06-02 DIAGNOSIS — I517 Cardiomegaly: Secondary | ICD-10-CM | POA: Diagnosis not present

## 2016-06-02 MED ORDER — IOPAMIDOL (ISOVUE-300) INJECTION 61%
80.0000 mL | Freq: Once | INTRAVENOUS | Status: AC | PRN
  Administered 2016-06-02: 80 mL via INTRAVENOUS

## 2016-06-03 ENCOUNTER — Ambulatory Visit: Payer: Medicare Other | Admitting: Internal Medicine

## 2016-06-04 ENCOUNTER — Telehealth: Payer: Self-pay | Admitting: Internal Medicine

## 2016-06-04 ENCOUNTER — Inpatient Hospital Stay: Payer: Medicare Other | Attending: Internal Medicine | Admitting: Internal Medicine

## 2016-06-04 VITALS — BP 121/71 | HR 82 | Temp 98.5°F | Resp 18 | Wt 205.0 lb

## 2016-06-04 DIAGNOSIS — K219 Gastro-esophageal reflux disease without esophagitis: Secondary | ICD-10-CM | POA: Diagnosis not present

## 2016-06-04 DIAGNOSIS — C189 Malignant neoplasm of colon, unspecified: Secondary | ICD-10-CM | POA: Insufficient documentation

## 2016-06-04 DIAGNOSIS — D649 Anemia, unspecified: Secondary | ICD-10-CM | POA: Insufficient documentation

## 2016-06-04 DIAGNOSIS — E559 Vitamin D deficiency, unspecified: Secondary | ICD-10-CM | POA: Diagnosis not present

## 2016-06-04 DIAGNOSIS — E875 Hyperkalemia: Secondary | ICD-10-CM

## 2016-06-04 DIAGNOSIS — C772 Secondary and unspecified malignant neoplasm of intra-abdominal lymph nodes: Secondary | ICD-10-CM | POA: Diagnosis not present

## 2016-06-04 DIAGNOSIS — N179 Acute kidney failure, unspecified: Secondary | ICD-10-CM | POA: Diagnosis not present

## 2016-06-04 DIAGNOSIS — I509 Heart failure, unspecified: Secondary | ICD-10-CM | POA: Diagnosis not present

## 2016-06-04 DIAGNOSIS — Z85028 Personal history of other malignant neoplasm of stomach: Secondary | ICD-10-CM

## 2016-06-04 DIAGNOSIS — R609 Edema, unspecified: Secondary | ICD-10-CM | POA: Insufficient documentation

## 2016-06-04 DIAGNOSIS — I2510 Atherosclerotic heart disease of native coronary artery without angina pectoris: Secondary | ICD-10-CM | POA: Diagnosis not present

## 2016-06-04 DIAGNOSIS — R0602 Shortness of breath: Secondary | ICD-10-CM | POA: Diagnosis not present

## 2016-06-04 DIAGNOSIS — K7689 Other specified diseases of liver: Secondary | ICD-10-CM | POA: Diagnosis not present

## 2016-06-04 DIAGNOSIS — M549 Dorsalgia, unspecified: Secondary | ICD-10-CM | POA: Diagnosis not present

## 2016-06-04 DIAGNOSIS — Z7982 Long term (current) use of aspirin: Secondary | ICD-10-CM | POA: Diagnosis not present

## 2016-06-04 DIAGNOSIS — R938 Abnormal findings on diagnostic imaging of other specified body structures: Secondary | ICD-10-CM | POA: Diagnosis not present

## 2016-06-04 DIAGNOSIS — I110 Hypertensive heart disease with heart failure: Secondary | ICD-10-CM | POA: Diagnosis not present

## 2016-06-04 DIAGNOSIS — Z79899 Other long term (current) drug therapy: Secondary | ICD-10-CM | POA: Diagnosis not present

## 2016-06-04 DIAGNOSIS — Z86711 Personal history of pulmonary embolism: Secondary | ICD-10-CM

## 2016-06-04 NOTE — Assessment & Plan Note (Addendum)
#   Colon cancer-stage III [pT4N1a] with multiple liver cysts on the CT scan]. No adjuvant therapy given multiple comorbidities for performance status. DEC 2017- no evidence of recurrence. Reviewed tumor markers/CEA elevated- 20; rising. Discussed my concern that patient most likely having recurrence based on tumor marker given to not clinically evident on CT scan.   Patient's performance status continues to be ECOG 3; reviewed with the patient and family that even though she has recurrent cancer/measurable cancer- difficult to offer treatments based upon her poor performance status multiple comorbidities.  #Endometrial thickening- no vaginal bleeding. Recommend further follow up with PCP/GYN.  # Back pain radiating to the right thigh- question arthritic/nerve root impinging. Chronic. Stable.    # Follow up in 5 months/labs/PET/ 1 week prior.   CC: [name], NP- [facility]; [place]. Discussed with PCP. Agrees.  # I reviewed the blood work- with the patient's daughter in detail; also reviewed the imagingndependently [as summarized above]; and with the patient's daughter in detail.

## 2016-06-04 NOTE — Telephone Encounter (Signed)
Called Dr. Rogue Bussing. He wanted to let me know that he plans to repeat scan in 6 months, no treatment at this time.

## 2016-06-04 NOTE — Progress Notes (Signed)
Patient is here for follow up, she is doing well. She has some trouble with getting short winded when she walks but is doing ok

## 2016-06-04 NOTE — Progress Notes (Signed)
Jill Bowers OFFICE PROGRESS NOTE  Patient Care Team: Jearld Fenton, NP as PCP - General (Internal Medicine)   SUMMARY OF ONCOLOGIC HISTORY:  Oncology History   # July 2016- COLON CANCER; mod diff adeno; STAGE III (pT4aN1a; MSI-STABLE) [s/p surgery;Dr. Arbutus Ped [place]];No adjuvant chemo sec to co-morbdities DEC 2016- CT-C/ A/P [[facility]]- subcentimeter hepatic cysts ; irregular soft tissue density in the peritoneal fat within pelvis ? Scarring  [less likely recurrence];   # DEC 2017- CT-NED [but CEA rising ~20]  # July R LE DVT/Bil PE s/p IVC filter; Hx of GIB; AKI; Hx of Severe Anemia; hx CHF     Colon cancer metastasized to intra-abdominal lymph node ([place])     INTERVAL HISTORY: Patient is a poor historian. Daughter accompanying the patient.  82 year old female patient with above history of stage III with multiple comorbidities- unable to tolerate adjuvant chemotherapy is here for follow-up/to review the results of the surveillance CAT scan.  As per the daughter patient has chronic shortness of breath which is not any worse. She is chronic swelling in the legs. She has noted that her weight has been fluctuating.   As per the daughter she also has chronic back pain radiating- right lower extremity. She has been taking hydrocodone only as needed.  REVIEW OF SYSTEMS:  A complete 10 point review of system is done which is negative except mentioned above/history of present illness.   PAST MEDICAL HISTORY :  Past Medical History:  Diagnosis Date  . Anemia   . Blood clot associated with vein wall inflammation   . CHF (congestive heart failure) ([place])   . Chicken pox   . Colon cancer ([place])   . Coronary artery disease   . DVT (deep venous thrombosis) ([place])   . Frequent headaches   . Gastrointestinal hemorrhage   . GERD (gastroesophageal reflux disease)   . Hyperkalemia   . Hypertension   . Malignant neoplasm of stomach ([place])   . PE (pulmonary embolism)   . Renal  disorder   . Renal insufficiency   . Vitamin D deficiency     PAST SURGICAL HISTORY :   Past Surgical History:  Procedure Laterality Date  . ABDOMINAL HYSTERECTOMY    . COLON SURGERY    . IVC FILTER PLACEMENT ([facility] HX)    . JOINT REPLACEMENT Right    knee    FAMILY HISTORY :   Family History  Problem Relation Age of Onset  . Arthritis Mother   . Hypertension Mother   . Colon cancer Sister   . Heart disease Daughter   . Colon cancer Son     SOCIAL HISTORY:   Social History  Substance Use Topics  . Smoking status: Never Smoker  . Smokeless tobacco: Not on file  . Alcohol use No    ALLERGIES:  is allergic to reglan [metoclopramide]; statins; and torsemide.  MEDICATIONS:  Current Outpatient Prescriptions  Medication Sig Dispense Refill  . acetaminophen (TYLENOL) 650 MG CR tablet Take 650 mg by mouth 3 (three) times daily with meals.    Marland Kitchen aspirin (ASPIRIN EC) 81 MG EC tablet Take 81 mg by mouth daily. Swallow whole.    . Cholecalciferol (VITAMIN D-3) 1000 UNITS CAPS Take 2 capsules by mouth daily.    . furosemide (LASIX) 40 MG tablet TAKE 1 TABLET (40 MG TOTAL) BY MOUTH 2 (TWO) TIMES DAILY AS NEEDED. 60 tablet 6  . HYDROcodone-acetaminophen (NORCO) 7.5-325 MG tablet Take 1 tablet by mouth every 6 (  six) hours as needed for moderate pain. 90 tablet 0  . Magnesium Oxide 500 MG TABS Take 1 tablet by mouth 2 (two) times daily.    Marland Kitchen omeprazole (PRILOSEC) 20 MG capsule Take 20 mg by mouth daily.    . Polyethylene Glycol 3350 (MIRALAX PO) Take by mouth daily.    Marland Kitchen spironolactone (ALDACTONE) 25 MG tablet Take 1 tablet (25 mg total) by mouth 2 (two) times daily as needed. 60 tablet 6  . UNABLE TO FIND Take 1 tablet by mouth 2 (two) times daily. Med Name: Omega Red    . vitamin C (ASCORBIC ACID) 500 MG tablet Take 500 mg by mouth daily.     No current facility-administered medications for this visit.     PHYSICAL EXAMINATION: ECOG PERFORMANCE STATUS: 3 - Symptomatic, >50%  confined to bed  BP 121/71 (BP Location: Left Arm, Patient Position: Sitting)   Pulse 82   Temp 98.5 F (36.9 C) (Tympanic)   Resp 18   Wt 205 lb (93 kg)   SpO2 96%   BMI 36.31 kg/m   Filed Weights   06/04/16 0942  Weight: 205 lb (93 kg)    Accompanied by her daughter.  ENERAL: Well-nourished well-developed; Alert, no distress and comfortable. In wheel chair.  EYES: no pallor or icterus OROPHARYNX: no thrush or ulceration; good dentition  NECK: supple, no masses felt LYMPH: no palpable lymphadenopathy in the cervical, axillary or inguinal regions LUNGS: clear to auscultation and No wheeze or crackles HEART/CVS: regular rate & rhythm and no murmurs; bilateral lower extremity swelling. ABDOMEN: abdomen soft, non-tender and normal bowel sounds Musculoskeletal:no cyanosis of digits and no clubbing  PSYCH: alert & oriented x 3 with fluent speech NEURO: no focal motor/sensory deficits SKIN: no rashes or significant lesions   LABORATORY DATA:  I have reviewed the data as listed    Component Value Date/Time   NA 139 05/24/2016 1535   NA 141 09/11/2015 0920   K 4.0 05/24/2016 1535   CL 100 (L) 05/24/2016 1535   CO2 29 05/24/2016 1535   GLUCOSE 99 05/24/2016 1535   BUN 20 05/24/2016 1535   BUN 25 09/11/2015 0920   CREATININE 1.18 (H) 05/24/2016 1535   CALCIUM 9.5 05/24/2016 1535   PROT 7.3 05/24/2016 1535   ALBUMIN 4.3 05/24/2016 1535   AST 18 05/24/2016 1535   ALT 10 (L) 05/24/2016 1535   ALKPHOS 58 05/24/2016 1535   BILITOT 0.5 05/24/2016 1535   GFRNONAA 41 (L) 05/24/2016 1535   GFRAA 47 (L) 05/24/2016 1535    No results found for: SPEP, UPEP  Lab Results  Component Value Date   WBC 5.0 05/24/2016   NEUTROABS 2.2 05/24/2016   HGB 12.6 05/24/2016   HCT 37.8 05/24/2016   MCV 91.2 05/24/2016   PLT 210 05/24/2016      Chemistry      Component Value Date/Time   NA 139 05/24/2016 1535   NA 141 09/11/2015 0920   K 4.0 05/24/2016 1535   CL 100 (L)  05/24/2016 1535   CO2 29 05/24/2016 1535   BUN 20 05/24/2016 1535   BUN 25 09/11/2015 0920   CREATININE 1.18 (H) 05/24/2016 1535      Component Value Date/Time   CALCIUM 9.5 05/24/2016 1535   ALKPHOS 58 05/24/2016 1535   AST 18 05/24/2016 1535   ALT 10 (L) 05/24/2016 1535   BILITOT 0.5 05/24/2016 1535       ASSESSMENT & PLAN:   Colon cancer metastasized  to intra-abdominal lymph node ([place]) # Colon cancer-stage III [pT4N1a] with multiple liver cysts on the CT scan]. No adjuvant therapy given multiple comorbidities for performance status. DEC 2017- no evidence of recurrence. Reviewed tumor markers/CEA elevated- 20; rising. Discussed my concern that patient most likely having recurrence based on tumor marker given to not clinically evident on CT scan.   Patient's performance status continues to be ECOG 3; reviewed with the patient and family that even though she has recurrent cancer/measurable cancer- difficult to offer treatments based upon her poor performance status multiple comorbidities.  #Endometrial thickening- no vaginal bleeding. Recommend further follow up with PCP/GYN.  # Back pain radiating to the right thigh- question arthritic/nerve root impinging. Chronic. Stable.    # Follow up in 5 months/labs/PET/ 1 week prior.   CC: [name], NP- [facility]; [place]. Discussed with PCP. Agrees.  # I reviewed the blood work- with the patient's daughter in detail; also reviewed the imagingndependently [as summarized above]; and with the patient's daughter in detail.       Cammie Sickle, MD 06/07/2016 8:06 AM

## 2016-06-04 NOTE — Telephone Encounter (Signed)
Dr.Brahmanday called asking for Jill Bowers to call him back.

## 2016-06-22 ENCOUNTER — Other Ambulatory Visit: Payer: Self-pay

## 2016-06-22 NOTE — Telephone Encounter (Signed)
Pt's daughter,[name] left v/m requesting rx hydrocodone apap. Call when ready for pick up. Last printed # 90 on 04/07/16. Last seen 01/06/16.

## 2016-06-23 MED ORDER — HYDROCODONE-ACETAMINOPHEN 7.5-325 MG PO TABS: 1.0000 | ORAL_TABLET | Freq: Four times a day (QID) | ORAL | 0 refills | Status: DC | PRN

## 2016-06-23 NOTE — Telephone Encounter (Signed)
RX printed and signed and placed in MYD box 

## 2016-06-23 NOTE — Telephone Encounter (Signed)
Spoke to person at pts home and informed them Rx is available for pickup from the front desk. States norma was not at home, but message to be relayed for pickup

## 2016-06-30 ENCOUNTER — Encounter: Payer: Self-pay | Admitting: Internal Medicine

## 2016-06-30 ENCOUNTER — Ambulatory Visit (INDEPENDENT_AMBULATORY_CARE_PROVIDER_SITE_OTHER): Payer: Medicare Other | Admitting: Internal Medicine

## 2016-06-30 VITALS — BP 128/68 | HR 55 | Temp 99.7°F | Wt 214.0 lb

## 2016-06-30 DIAGNOSIS — R059 Cough, unspecified: Secondary | ICD-10-CM

## 2016-06-30 DIAGNOSIS — R05 Cough: Secondary | ICD-10-CM | POA: Diagnosis not present

## 2016-06-30 DIAGNOSIS — J181 Lobar pneumonia, unspecified organism: Secondary | ICD-10-CM

## 2016-06-30 DIAGNOSIS — J189 Pneumonia, unspecified organism: Secondary | ICD-10-CM

## 2016-06-30 MED ORDER — LEVOFLOXACIN 250 MG PO TABS: ORAL_TABLET | ORAL | 0 refills | Status: DC

## 2016-06-30 MED ORDER — HYDROCODONE-HOMATROPINE 5-1.5 MG/5ML PO SYRP: 5.0000 mL | ORAL_SOLUTION | Freq: Three times a day (TID) | ORAL | 0 refills | Status: DC | PRN

## 2016-06-30 NOTE — Patient Instructions (Signed)
Cough, Adult Introduction A cough helps to clear your throat and lungs. A cough may last only 2-3 weeks (acute), or it may last longer than 8 weeks (chronic). Many different things can cause a cough. A cough may be a sign of an illness or another medical condition. Follow these instructions at home:  Pay attention to any changes in your cough.  Take medicines only as told by your doctor.  If you were prescribed an antibiotic medicine, take it as told by your doctor. Do not stop taking it even if you start to feel better.  Talk with your doctor before you try using a cough medicine.  Drink enough fluid to keep your pee (urine) clear or pale yellow.  If the air is dry, use a cold steam vaporizer or humidifier in your home.  Stay away from things that make you cough at work or at home.  If your cough is worse at night, try using extra pillows to raise your head up higher while you sleep.  Do not smoke, and try not to be around smoke. If you need help quitting, ask your doctor.  Do not have caffeine.  Do not drink alcohol.  Rest as needed. Contact a doctor if:  You have new problems (symptoms).  You cough up yellow fluid (pus).  Your cough does not get better after 2-3 weeks, or your cough gets worse.  Medicine does not help your cough and you are not sleeping well.  You have pain that gets worse or pain that is not helped with medicine.  You have a fever.  You are losing weight and you do not know why.  You have night sweats. Get help right away if:  You cough up blood.  You have trouble breathing.  Your heartbeat is very fast. This information is not intended to replace advice given to you by your health care provider. Make sure you discuss any questions you have with your health care provider. Document Released: 02/25/2011 Document Revised: 11/20/2015 Document Reviewed: 08/21/2014  2017 Elsevier  

## 2016-06-30 NOTE — Progress Notes (Signed)
HPI  Pt presents to the clinic today with c/o nasal congestion, cough and chest congestion. This started 1 week ago. She is blowing clear mucous out of her nose. The cough is non productive. She denies ear pain, sore throat, fever, chills, body aches or shortness of breath. She has tried Copywriter, advertising and Zyrtec without any relief. She has no history of seasonal allergies.  She does have a history of CHF, managed on Lasix and Spironolactoe. She has not had sick contacts that she is aware of. She is UTD on her flu vaccines.  Review of Systems        Past Medical History:  Diagnosis Date  . Anemia   . Blood clot associated with vein wall inflammation   . CHF (congestive heart failure) ([place])   . Chicken pox   . Colon cancer ([place])   . Coronary artery disease   . DVT (deep venous thrombosis) ([place])   . Frequent headaches   . Gastrointestinal hemorrhage   . GERD (gastroesophageal reflux disease)   . Hyperkalemia   . Hypertension   . Malignant neoplasm of stomach ([place])   . PE (pulmonary embolism)   . Renal disorder   . Renal insufficiency   . Vitamin D deficiency     Family History  Problem Relation Age of Onset  . Arthritis Mother   . Hypertension Mother   . Colon cancer Sister   . Heart disease Daughter   . Colon cancer Son     Social History   Social History  . Marital status: Widowed    Spouse name: N/A  . Number of children: N/A  . Years of education: N/A   Occupational History  . Not on file.   Social History Main Topics  . Smoking status: Never Smoker  . Smokeless tobacco: Not on file  . Alcohol use No  . Drug use: No  . Sexual activity: No   Other Topics Concern  . Not on file   Social History Narrative  . No narrative on file    Allergies  Allergen Reactions  . Reglan [Metoclopramide] Other (See Comments)  . Statins Other (See Comments)  . Torsemide Other (See Comments)     Constitutional: Positive fatigue and fever. Denies headache or abrupt  weight changes.  HEENT:  Positive nasal congestion. Denies eye redness, eye pain, pressure behind the eyes, facial pain, ear pain, ringing in the ears, wax buildup, runny nose or sore throat. Respiratory: Positive cough. Denies difficulty breathing or shortness of breath.  Cardiovascular: Denies chest pain, chest tightness, palpitations or swelling in the hands or feet.   No other specific complaints in a complete review of systems (except as listed in HPI above).  Objective:   BP 128/68   Pulse (!) 55   Temp 99.7 F (37.6 C) (Oral)   Wt 214 lb (97.1 kg)   SpO2 97%   BMI 37.91 kg/m   Wt Readings from Last 3 Encounters:  06/30/16 214 lb (97.1 kg)  06/04/16 205 lb (93 kg)  01/29/16 192 lb (87.1 kg)     General: Appears her stated age, ill appearing in NAD. HEENT: Head: normal shape and size; Throat/Mouth: Teeth present, mucosa pink and moist, no exudate noted, no lesions or ulcerations noted.  Neck: No cervical lymphadenopathy.  Pulmonary/Chest: Normal effort and positive vesicular breath sounds with crackles noted in the LLL. No respiratory distress. No wheezes, or ronchi noted.       Assessment & Plan:  Cough with concern for CAP:  Get some rest and drink plenty of water eRx for Levaquin 500 mg today then 250 mg daily x 6 days Rx for Hycodan cough syrup  RTC as needed or if symptoms persist.   Webb Silversmith, NP

## 2016-07-02 ENCOUNTER — Other Ambulatory Visit: Payer: Self-pay | Admitting: Internal Medicine

## 2016-07-02 ENCOUNTER — Encounter: Payer: Self-pay | Admitting: Emergency Medicine

## 2016-07-02 ENCOUNTER — Emergency Department: Payer: Medicare Other

## 2016-07-02 ENCOUNTER — Telehealth: Payer: Self-pay

## 2016-07-02 ENCOUNTER — Emergency Department
Admission: EM | Admit: 2016-07-02 | Discharge: 2016-07-02 | Disposition: A | Discharge status: Home / Self Care | Payer: Medicare Other | Attending: Emergency Medicine | Admitting: Emergency Medicine

## 2016-07-02 DIAGNOSIS — I110 Hypertensive heart disease with heart failure: Secondary | ICD-10-CM | POA: Insufficient documentation

## 2016-07-02 DIAGNOSIS — R05 Cough: Secondary | ICD-10-CM | POA: Diagnosis not present

## 2016-07-02 DIAGNOSIS — I509 Heart failure, unspecified: Secondary | ICD-10-CM | POA: Insufficient documentation

## 2016-07-02 DIAGNOSIS — Z79899 Other long term (current) drug therapy: Secondary | ICD-10-CM | POA: Insufficient documentation

## 2016-07-02 DIAGNOSIS — R0602 Shortness of breath: Secondary | ICD-10-CM | POA: Diagnosis not present

## 2016-07-02 DIAGNOSIS — I2510 Atherosclerotic heart disease of native coronary artery without angina pectoris: Secondary | ICD-10-CM | POA: Diagnosis not present

## 2016-07-02 DIAGNOSIS — J40 Bronchitis, not specified as acute or chronic: Secondary | ICD-10-CM | POA: Diagnosis not present

## 2016-07-02 LAB — BASIC METABOLIC PANEL
Anion gap: 8 (ref 5–15)
BUN: 17 mg/dL (ref 6–20)
CHLORIDE: 106 mmol/L (ref 101–111)
CO2: 29 mmol/L (ref 22–32)
CREATININE: 1.26 mg/dL — AB (ref 0.44–1.00)
Calcium: 9.2 mg/dL (ref 8.9–10.3)
GFR calc Af Amer: 44 mL/min — ABNORMAL LOW (ref >60)
GFR calc non Af Amer: 38 mL/min — ABNORMAL LOW (ref >60)
Glucose, Bld: 106 mg/dL — ABNORMAL HIGH (ref 65–99)
Potassium: 4.3 mmol/L (ref 3.5–5.1)
Sodium: 143 mmol/L (ref 135–145)

## 2016-07-02 LAB — CBC
HEMATOCRIT: 38.3 % (ref 35.0–47.0)
Hemoglobin: 12.8 g/dL (ref 12.0–16.0)
MCH: 30.4 pg (ref 26.0–34.0)
MCHC: 33.5 g/dL (ref 32.0–36.0)
MCV: 90.8 fL (ref 80.0–100.0)
PLATELETS: 193 10*3/uL (ref 150–440)
RBC: 4.22 MIL/uL (ref 3.80–5.20)
RDW: 13.8 % (ref 11.5–14.5)
WBC: 4.4 10*3/uL (ref 3.6–11.0)

## 2016-07-02 LAB — RAPID INFLUENZA A&B ANTIGENS
Influenza A (ARMC): NEGATIVE
Influenza B (ARMC): NEGATIVE

## 2016-07-02 LAB — TROPONIN I: Troponin I: <0.03 ng/mL (ref <0.03)

## 2016-07-02 MED ORDER — ALBUTEROL SULFATE HFA 108 (90 BASE) MCG/ACT IN AERS: 2.0000 | INHALATION_SPRAY | Freq: Four times a day (QID) | RESPIRATORY_TRACT | 2 refills | Status: DC | PRN

## 2016-07-02 MED ORDER — IPRATROPIUM-ALBUTEROL 0.5-2.5 (3) MG/3ML IN SOLN
3.0000 mL | Freq: Once | RESPIRATORY_TRACT | Status: AC
  Administered 2016-07-02: 3 mL via RESPIRATORY_TRACT
  Filled 2016-07-02: qty 3

## 2016-07-02 MED ORDER — PREDNISONE 10 MG PO TABS: ORAL_TABLET | ORAL | 0 refills | Status: DC

## 2016-07-02 MED ORDER — PREDNISONE 20 MG PO TABS: 40.0000 mg | ORAL_TABLET | Freq: Every day | ORAL | 0 refills | Status: DC

## 2016-07-02 MED ORDER — METHYLPREDNISOLONE SODIUM SUCC 125 MG IJ SOLR
125.0000 mg | Freq: Once | INTRAMUSCULAR | Status: AC
  Administered 2016-07-02: 125 mg via INTRAVENOUS
  Filled 2016-07-02: qty 2

## 2016-07-02 NOTE — ED Provider Notes (Signed)
[facility] Emergency Department Provider Note  Time seen: 10:37 AM  I have reviewed the triage vital signs and the nursing notes.   HISTORY  Chief Complaint Shortness of Breath    HPI Jill Bowers is a 83 y.o. female with a past medical history of CHF, gastric reflux, hypertension, presents to the emergency department for difficulty breathing, cough and wheeze. According to the patient and daughter for the past 5 or 6 days the patient has been coughing with audible wheeze and shortness of breath. Daughter also reports fever at home. They saw her primary care doctor who diagnosed the patient clinically with a pneumonia and started her on Zithromax 4 days ago. Patient daughter stated no improvement since starting antibiotics. Continues to have fever at home. Continues to have wheezes and shortness of breath.  Past Medical History:  Diagnosis Date  . Anemia   . Blood clot associated with vein wall inflammation   . CHF (congestive heart failure) ([place])   . Chicken pox   . Colon cancer ([place])   . Coronary artery disease   . DVT (deep venous thrombosis) ([place])   . Frequent headaches   . Gastrointestinal hemorrhage   . GERD (gastroesophageal reflux disease)   . Hyperkalemia   . Hypertension   . Malignant neoplasm of stomach ([place])   . PE (pulmonary embolism)   . Renal disorder   . Renal insufficiency   . Vitamin D deficiency     Patient Active Problem List   Diagnosis Date Noted  . PAD (peripheral artery disease) ([place]) 07/14/2015  . Bradycardia 07/03/2015  . Symptomatic bradycardia 07/02/2015  . Chronic congestive heart failure ([place]) 06/12/2015  . Essential hypertension 06/12/2015  . Vitamin D deficiency 06/12/2015  . History of DVT 06/12/2015  . History of pulmonary embolus (PE) 06/12/2015  . GERD (gastroesophageal reflux disease) 06/12/2015  . Colon cancer metastasized to intra-abdominal lymph node ([place]) 06/02/2015    Past Surgical History:  Procedure  Laterality Date  . ABDOMINAL HYSTERECTOMY    . COLON SURGERY    . IVC FILTER PLACEMENT ([facility] HX)    . JOINT REPLACEMENT Right    knee    Prior to Admission medications   Medication Sig Start Date End Date Taking? Authorizing Provider  acetaminophen (TYLENOL) 650 MG CR tablet Take 650 mg by mouth 3 (three) times daily with meals.    Historical Provider, MD  aspirin (ASPIRIN EC) 81 MG EC tablet Take 81 mg by mouth daily. Swallow whole.    Historical Provider, MD  Cholecalciferol (VITAMIN D-3) 1000 UNITS CAPS Take 2 capsules by mouth daily.    Historical Provider, MD  furosemide (LASIX) 40 MG tablet TAKE 1 TABLET (40 MG TOTAL) BY MOUTH 2 (TWO) TIMES DAILY AS NEEDED. 03/10/16   Minna Merritts, MD  HYDROcodone-acetaminophen (NORCO) 7.5-325 MG tablet Take 1 tablet by mouth every 6 (six) hours as needed for moderate pain. 06/23/16   Jearld Fenton, NP  HYDROcodone-homatropine [facility]) 5-1.5 MG/5ML syrup Take 5 mLs by mouth every 8 (eight) hours as needed for cough. 06/30/16   Jearld Fenton, NP  levofloxacin (LEVAQUIN) 250 MG tablet Take 500 mg today, then 250 mg daily x 6 days 06/30/16   Jearld Fenton, NP  Magnesium Oxide 500 MG TABS Take 1 tablet by mouth 2 (two) times daily.    Historical Provider, MD  omeprazole (PRILOSEC) 20 MG capsule Take 20 mg by mouth daily.    Historical Provider, MD  Polyethylene Glycol  3350 (MIRALAX PO) Take by mouth daily.    Historical Provider, MD  spironolactone (ALDACTONE) 25 MG tablet Take 1 tablet (25 mg total) by mouth 2 (two) times daily as needed. 07/14/15   Minna Merritts, MD  UNABLE TO FIND Take 1 tablet by mouth 2 (two) times daily. Med Name: Omega Red    Historical Provider, MD  vitamin C (ASCORBIC ACID) 500 MG tablet Take 500 mg by mouth daily.    Historical Provider, MD    Allergies  Allergen Reactions  . Reglan [Metoclopramide] Other (See Comments)  . Statins Other (See Comments)  . Torsemide Other (See Comments)    Family History  Problem  Relation Age of Onset  . Arthritis Mother   . Hypertension Mother   . Colon cancer Sister   . Heart disease Daughter   . Colon cancer Son     Social History Social History  Substance Use Topics  . Smoking status: Never Smoker  . Smokeless tobacco: Never Used  . Alcohol use No    Review of Systems Constitutional: Positive for fever Cardiovascular: Negative for chest pain. Respiratory: Positive for shortness of breath. Positive for cough. Positive for wheeze. Gastrointestinal: Negative for abdominal pain, vomiting and diarrhea. Neurological: Negative for headaches, focal weakness or numbness. 10-point ROS otherwise negative.  ____________________________________________   PHYSICAL EXAM:  VITAL SIGNS: ED Triage Vitals  Enc Vitals Group     BP 07/02/16 1003 (!) 142/58     Pulse Rate 07/02/16 1003 83     Resp 07/02/16 1003 18     Temp 07/02/16 1003 99.3 F (37.4 C)     Temp Source 07/02/16 1003 Oral     SpO2 07/02/16 1003 95 %     Weight 07/02/16 1003 214 lb (97.1 kg)     Height --      Head Circumference --      Peak Flow --      Pain Score 07/02/16 1031 0     Pain Loc --      Pain Edu? --      Excl. in [place]? --     Constitutional: Alert and oriented. Well appearing and in no distress. Eyes: Normal exam ENT   Head: Normocephalic and atraumatic.   Mouth/Throat: Mucous membranes are moist. Cardiovascular: Normal rate, regular rhythm. No murmur Respiratory: Normal respiratory effort without tachypnea nor retractions. Moderate expiratory wheeze right greater than left. No rales or rhonchi. Gastrointestinal: Soft and nontender. No distention.  Musculoskeletal: Nontender with normal range of motion in all extremities.  Neurologic:  Normal speech and language. No gross focal neurologic deficits Skin:  Skin is warm, dry and intact.  Psychiatric: Mood and affect are normal. Speech and behavior are normal.   ____________________________________________     EKG  EKG reviewed and interpreted by myself shows sinus rhythm at 81 bpm. Normal axis, normal intervals and nonspecific ST changes without ST elevation.  ____________________________________________    RADIOLOGY  Chest x-ray negative  ____________________________________________   INITIAL IMPRESSION / ASSESSMENT AND PLAN / ED COURSE  Pertinent labs & imaging results that were available during my care of the patient were reviewed by me and considered in my medical decision making (see chart for details).  The patient presents to the emergency department with shortness of breath. Patient has low-grade fever at home. Audible wheeze in the emergency department, right greater than left. We will check labs, chest x-ray, treat with DuoNeb's and closely monitor in the emergency department.  Chest x-ray  negative. Labs are largely within normal limits. Patient's maintaining oxygen saturation of 95-98 percent on room air. Continues to have a mild wheeze. We will dose Solu-Medrol. Given the patient's continued wheeze discussed with the patient and her daughter admission to the hospital versus discharge home. The patient strongly wishes to be discharged home. We'll discharge on prednisone and albuterol. Patient is agreeable to plan. I discussed strict return precautions with the patient and her daughter.  ____________________________________________   FINAL CLINICAL IMPRESSION(S) / ED DIAGNOSES  Dyspnea Bronchitis   Harvest Dark, MD 07/02/16 1252

## 2016-07-02 NOTE — ED Notes (Signed)
Pt took atbx and cough medicine this morning prescribed on Tuesday.

## 2016-07-02 NOTE — ED Notes (Signed)
Patient transported to X-ray 

## 2016-07-02 NOTE — ED Notes (Signed)
XR notified patient getting a breathing treatment now and to wait about 10 minutes until done.

## 2016-07-02 NOTE — Telephone Encounter (Signed)
Will add Prednisone. To UC or ER if worse

## 2016-07-02 NOTE — Telephone Encounter (Signed)
Left detailed msg on VM per HIPAA  

## 2016-07-02 NOTE — ED Triage Notes (Signed)
Patient to ED via POV with daughter, patient was diagnosed with pneumonia on Tuesday by her PCP and given antibiotics. Patient dtr states that patients congestion and wheezing has gotten worse. Patient has still been running fever.  Patient in NAD in triage. Audible wheezes present.

## 2016-07-02 NOTE — Telephone Encounter (Signed)
Pt was seen about 3 days ago for "pneumonia per daugther". Daugther called to let us know that her mother isn't doing better she's "wheezing a lot more and is complaining of body aches. Please advise thanks.

## 2016-07-02 NOTE — Discharge Instructions (Signed)
Please take your steroids and use her albuterol inhaler as prescribed. Return to the emergency department for any worsening trouble breathing, any chest pain, or any other symptom personally concerning to yourself.

## 2016-07-30 ENCOUNTER — Ambulatory Visit (INDEPENDENT_AMBULATORY_CARE_PROVIDER_SITE_OTHER): Payer: Medicare Other | Admitting: Cardiovascular Disease

## 2016-07-30 ENCOUNTER — Encounter: Payer: Self-pay | Admitting: Cardiovascular Disease

## 2016-07-30 VITALS — BP 130/78 | HR 86 | Ht 63.0 in | Wt 207.5 lb

## 2016-07-30 DIAGNOSIS — I509 Heart failure, unspecified: Secondary | ICD-10-CM

## 2016-07-30 DIAGNOSIS — I10 Essential (primary) hypertension: Secondary | ICD-10-CM

## 2016-07-30 DIAGNOSIS — R001 Bradycardia, unspecified: Secondary | ICD-10-CM

## 2016-07-30 DIAGNOSIS — J209 Acute bronchitis, unspecified: Secondary | ICD-10-CM | POA: Diagnosis not present

## 2016-07-30 DIAGNOSIS — Z9181 History of falling: Secondary | ICD-10-CM

## 2016-07-30 DIAGNOSIS — M545 Low back pain: Secondary | ICD-10-CM

## 2016-07-30 DIAGNOSIS — G8929 Other chronic pain: Secondary | ICD-10-CM

## 2016-07-30 NOTE — Progress Notes (Signed)
Cardiology Office Note  Date:  07/30/2016   ID:  Jill Bowers, DOB Feb 27, 1929, MRN [id number]  PCP:  Webb Silversmith, NP   Chief Complaint  Patient presents with  . other    21mo f/u. Pt c/o intermittent chest pain and constant sob. Reviewed meds with pt verbally.    HPI:  [name] is a pleasant 83 year old woman With history of bradycardia , recently hospitalized at [facility] For bradycardia, At the time of discharged was referred to our office  history of DVT, PE, IVC filter in place, prior rectal bleeding, not on anticoagulation Notes indicate history of anemia, History of colon cancer, followed by oncology, stage III she presents today for follow-up of her bradycardia also recent visit to the emergency room for shortness of breath,   In follow-up she presents with her daughter  05/2016: she started getting sick with cough, viral-type symptoms  Then had bronchitis per the family    started on a Z-Pak  she went to theER 07/02/2016 Negative for flu 07/02/2016 Started on steroids, albterol Overall feeling better, still with some cough in the morning and the evening but slow improvement  Taking lasix daily, sometimes extra Lasix for shortness of breath   BMP:  CR 1.26, BUN 17, at that time was not drinking much legs are weak, chronic knee pain,  chronic back pain, worse recently as she has been sitting more per the daughter Previously seen by orthopedics  No significant leg edema  HA1C 5.8 Total chol 172, LDL 89  EKG shows normal sinus rhythm with rate 86 bpm T-wave abnormality 1 and aVL  Other past medical history reviewed she currently lives at home with her daughter , previously was living in [place] She was working with home health, PT. They noted low heart rate at home was recently evaluated in the hospital 07/02/2015 for heart rate in the 40s. Also was reported to have low blood pressure.  Echocardiogram in the hospital reviewed, normal ejection  fraction, Mention of posterior pericardial effusion carvedilol was held, she had improvement of her heart rate , discharged with follow-up in clinic today   PMH:   has a past medical history of Anemia; Blood clot associated with vein wall inflammation; CHF (congestive heart failure) ([place]); Chicken pox; Colon cancer ([place]); Coronary artery disease; DVT (deep venous thrombosis) ([place]); Frequent headaches; Gastrointestinal hemorrhage; GERD (gastroesophageal reflux disease); Hyperkalemia; Hypertension; Malignant neoplasm of stomach ([place]); PE (pulmonary embolism); Renal disorder; Renal insufficiency; and Vitamin D deficiency.  PSH:    Past Surgical History:  Procedure Laterality Date  . ABDOMINAL HYSTERECTOMY    . COLON SURGERY    . IVC FILTER PLACEMENT ([facility] HX)    . JOINT REPLACEMENT Right    knee    Current Outpatient Prescriptions  Medication Sig Dispense Refill  . acetaminophen (TYLENOL) 650 MG CR tablet Take 650 mg by mouth 3 (three) times daily with meals.    Marland Kitchen aspirin (ASPIRIN EC) 81 MG EC tablet Take 81 mg by mouth daily. Swallow whole.    . Cholecalciferol (VITAMIN D-3) 1000 UNITS CAPS Take 2 capsules by mouth daily.    . furosemide (LASIX) 40 MG tablet TAKE 1 TABLET (40 MG TOTAL) BY MOUTH 2 (TWO) TIMES DAILY AS NEEDED. 60 tablet 6  . HYDROcodone-acetaminophen (NORCO) 7.5-325 MG tablet Take 1 tablet by mouth every 6 (six) hours as needed for moderate pain. 90 tablet 0  . Magnesium Oxide 500 MG TABS Take 1 tablet by mouth 2 (two) times daily.    Marland Kitchen  omeprazole (PRILOSEC) 20 MG capsule Take 20 mg by mouth daily.    . Polyethylene Glycol 3350 (MIRALAX PO) Take by mouth daily.    Marland Kitchen spironolactone (ALDACTONE) 25 MG tablet Take 1 tablet (25 mg total) by mouth 2 (two) times daily as needed. 60 tablet 6  . UNABLE TO FIND Take 1 tablet by mouth 2 (two) times daily. Med Name: Omega Red    . vitamin C (ASCORBIC ACID) 500 MG tablet Take 500 mg by mouth daily.     No current  facility-administered medications for this visit.      Allergies:   Reglan [metoclopramide]; Statins; and Torsemide   Social History:  The patient  reports that she has never smoked. She has never used smokeless tobacco. She reports that she does not drink alcohol or use drugs.   Family History:   family history includes Arthritis in her mother; Colon cancer in her sister and son; Heart disease in her daughter; Hypertension in her mother.    Review of Systems: Review of Systems  Respiratory: Positive for cough.   Cardiovascular: Negative.   Gastrointestinal: Negative.   Musculoskeletal: Positive for back pain.       Leg weakness  Neurological: Positive for weakness.  Psychiatric/Behavioral: Negative.   All other systems reviewed and are negative.    PHYSICAL EXAM: VS:  BP 130/78 (BP Location: Left Arm, Patient Position: Sitting, Cuff Size: Normal)   Pulse 86   Ht 5\' 3"  (1.6 m)   Wt 207 lb 8 oz (94.1 kg)   BMI 36.76 kg/m  , BMI Body mass index is 36.76 kg/m. GEN: Well nourished, well developed, in no acute distress , Obese, presenting a wheelchair HEENT: normal  Neck: no JVD, carotid bruits, or masses Cardiac: RRR; no murmurs, rubs, or gallops,no edema  Respiratory:  clear to auscultation bilaterally, unable to exclude small area scant rhonchi at left base , normal work of breathing GI: soft, nontender, nondistended, + BS MS: no deformity or atrophy  Skin: warm and dry, no rash Neuro:  Strength and sensation are intact Psych: euthymic mood, full affect    Recent Labs: 05/24/2016: ALT 10 07/02/2016: BUN 17; Creatinine, Ser 1.26; Hemoglobin 12.8; Platelets 193; Potassium 4.3; Sodium 143    Lipid Panel Lab Results  Component Value Date   CHOL 172 06/12/2015   HDL 52.60 06/12/2015   LDLCALC 89 06/12/2015   TRIG 155.0 (H) 06/12/2015      Wt Readings from Last 3 Encounters:  07/30/16 207 lb 8 oz (94.1 kg)  07/02/16 214 lb (97.1 kg)  06/30/16 214 lb (97.1 kg)        ASSESSMENT AND PLAN:  Chronic congestive heart failure, unspecified congestive heart failure type ([place]) - Plan: EKG 12-Lead Shortly taking Lasix daily, daughter gives extra Lasix as needed for shortness of breath Potassium stable, creatinine high end of the range Discussed lab work with her in detail, we'll try to avoid hypovolemia  Essential hypertension - Plan: EKG 12-Lead Blood pressure is well controlled on today's visit. No changes m Bradycardia Asymptomatic, beta blocker held, rate improved  Acute bronchitis, unspecified organism Hospital records reviewed Symptoms resolved after steroids, albuterol, Z-Pak Occasional cough, appears to be getting better  At risk for falling Long discussion with her concerning her need to walk with her walker Setback after recent bronchitis, has been sedentary for at least 3 weeks, now very weak Presenting a wheelchair today  Chronic low back pain, unspecified back pain laterality, with sciatica presence unspecified  Recommended that she needed to get up and start moving, perhaps talk with primary care about home PT    Total encounter time more than 25 minutes  Greater than 50% was spent in counseling and coordination of care with the patient   Disposition:   F/U  6 months   Orders Placed This Encounter  Procedures  . EKG 12-Lead     Signed, Esmond Plants, M.D., Ph.D. 07/30/2016  [place], [place]

## 2016-07-30 NOTE — Patient Instructions (Signed)

## 2016-08-01 ENCOUNTER — Other Ambulatory Visit: Payer: Self-pay | Admitting: Cardiovascular Disease

## 2016-08-05 ENCOUNTER — Encounter: Payer: Self-pay | Admitting: Internal Medicine

## 2016-08-05 ENCOUNTER — Ambulatory Visit (INDEPENDENT_AMBULATORY_CARE_PROVIDER_SITE_OTHER): Payer: Medicare Other | Admitting: Internal Medicine

## 2016-08-05 VITALS — BP 132/68 | HR 74 | Temp 98.9°F | Wt 205.0 lb

## 2016-08-05 DIAGNOSIS — G8929 Other chronic pain: Secondary | ICD-10-CM | POA: Diagnosis not present

## 2016-08-05 DIAGNOSIS — M545 Low back pain, unspecified: Secondary | ICD-10-CM

## 2016-08-05 MED ORDER — HYDROCODONE-ACETAMINOPHEN 7.5-325 MG PO TABS
1.0000 | ORAL_TABLET | Freq: Three times a day (TID) | ORAL | 0 refills | Status: DC | PRN
Start: 2016-08-05 — End: 2016-09-16

## 2016-08-05 NOTE — Progress Notes (Signed)
Subjective:    Patient ID: Jill Bowers, female    DOB: [birth year], 83 y.o.   MRN: [id number]  HPI  Pt presents to the clinic today with c/o low back pain. Per the patients daughter, this has been a chronic issue for her, but worse over the last 3 weeks. She describes the pain as pressure. The pain does not radiate into her legs. She can not stand for long periods of time. She does have weakness, but this is not new. She had a CT abdomen 05/2016 which showed:  Musculoskeletal: Grade 1 degenerative anterolisthesis at L5-S1 with a large central disc protrusion resulting an central narrowing of the thecal sac and right greater than left foraminal stenosis. There is also a diffuse disc bulge at the L4-5 level likely causing bilateral foraminal impingement.  She reports she has been taking Tylenol and Norco with good relief. She is obese, complicating her back issues further. She is also currently following with oncology for recurrent colon cancer. This was not evident on CT but tumor markers are high and rising. She has a PET scan scheduled for 10/2016. Her daughter is concerned that this back pain may be caused by the cancer spreading to her bones. I had discussed with her oncologist previously about why she should not undergo treatment for her recurrent cancer.  Review of Systems      Past Medical History:  Diagnosis Date  . Anemia   . Blood clot associated with vein wall inflammation   . CHF (congestive heart failure) ([place])   . Chicken pox   . Colon cancer ([place])   . Coronary artery disease   . DVT (deep venous thrombosis) ([place])   . Frequent headaches   . Gastrointestinal hemorrhage   . GERD (gastroesophageal reflux disease)   . Hyperkalemia   . Hypertension   . Malignant neoplasm of stomach ([place])   . PE (pulmonary embolism)   . Renal disorder   . Renal insufficiency   . Vitamin D deficiency     Current Outpatient Prescriptions  Medication Sig Dispense Refill  . acetaminophen  (TYLENOL) 650 MG CR tablet Take 650 mg by mouth 3 (three) times daily with meals.    Marland Kitchen aspirin (ASPIRIN EC) 81 MG EC tablet Take 81 mg by mouth daily. Swallow whole.    . Cholecalciferol (VITAMIN D-3) 1000 UNITS CAPS Take 2 capsules by mouth daily.    . furosemide (LASIX) 40 MG tablet TAKE 1 TABLET (40 MG TOTAL) BY MOUTH 2 (TWO) TIMES DAILY AS NEEDED. 60 tablet 6  . HYDROcodone-acetaminophen (NORCO) 7.5-325 MG tablet Take 1 tablet by mouth every 6 (six) hours as needed for moderate pain. 90 tablet 0  . Magnesium Oxide 500 MG TABS Take 1 tablet by mouth 2 (two) times daily.    Marland Kitchen omeprazole (PRILOSEC) 20 MG capsule Take 20 mg by mouth daily.    . Polyethylene Glycol 3350 (MIRALAX PO) Take by mouth daily.    Marland Kitchen spironolactone (ALDACTONE) 25 MG tablet TAKE 1 TABLET (25 MG TOTAL) BY MOUTH 2 (TWO) TIMES DAILY AS NEEDED. 60 tablet 3  . UNABLE TO FIND Take 1 tablet by mouth 2 (two) times daily. Med Name: Omega Red    . vitamin C (ASCORBIC ACID) 500 MG tablet Take 500 mg by mouth daily.     No current facility-administered medications for this visit.     Allergies  Allergen Reactions  . Reglan [Metoclopramide] Other (See Comments)  . Statins Other (See Comments)  .  Torsemide Other (See Comments)    Family History  Problem Relation Age of Onset  . Arthritis Mother   . Hypertension Mother   . Colon cancer Sister   . Heart disease Daughter   . Colon cancer Son     Social History   Social History  . Marital status: Widowed    Spouse name: N/A  . Number of children: N/A  . Years of education: N/A   Occupational History  . Not on file.   Social History Main Topics  . Smoking status: Never Smoker  . Smokeless tobacco: Never Used  . Alcohol use No  . Drug use: No  . Sexual activity: No   Other Topics Concern  . Not on file   Social History Narrative  . No narrative on file     Constitutional: Denies fever, malaise, fatigue, headache or abrupt weight changes.  GU: Denies  urgency, frequency, pain with urination, burning sensation, blood in urine, odor or discharge. Musculoskeletal: Pt reports low back pain.     No other specific complaints in a complete review of systems (except as listed in HPI above).  Objective:   Physical Exam    BP 132/68   Pulse 74   Temp 98.9 F (37.2 C) (Oral)   Wt 205 lb (93 kg)   SpO2 96%   BMI 36.31 kg/m  Wt Readings from Last 3 Encounters:  08/05/16 205 lb (93 kg)  07/30/16 207 lb 8 oz (94.1 kg)  07/02/16 214 lb (97.1 kg)    General: Appears her stated age, obese in NAD. Musculoskeletal: Difficult to assess. She is essentially bed/wheelchair bound. She is unable to get up on exam table, so I have to examine her in her wheel chair. Bony tenderness noted over the lumbar spine. Unable to assess LE strength or gait.    BMET    Component Value Date/Time   NA 143 07/02/2016 1031   NA 141 09/11/2015 0920   K 4.3 07/02/2016 1031   CL 106 07/02/2016 1031   CO2 29 07/02/2016 1031   GLUCOSE 106 (H) 07/02/2016 1031   BUN 17 07/02/2016 1031   BUN 25 09/11/2015 0920   CREATININE 1.26 (H) 07/02/2016 1031   CALCIUM 9.2 07/02/2016 1031   GFRNONAA 38 (L) 07/02/2016 1031   GFRAA 44 (L) 07/02/2016 1031    Lipid Panel     Component Value Date/Time   CHOL 172 06/12/2015 1125   TRIG 155.0 (H) 06/12/2015 1125   HDL 52.60 06/12/2015 1125   CHOLHDL 3 06/12/2015 1125   VLDL 31.0 06/12/2015 1125   LDLCALC 89 06/12/2015 1125    CBC    Component Value Date/Time   WBC 4.4 07/02/2016 1031   RBC 4.22 07/02/2016 1031   HGB 12.8 07/02/2016 1031   HCT 38.3 07/02/2016 1031   PLT 193 07/02/2016 1031   MCV 90.8 07/02/2016 1031   MCH 30.4 07/02/2016 1031   MCHC 33.5 07/02/2016 1031   RDW 13.8 07/02/2016 1031   LYMPHSABS 2.3 05/24/2016 1535   MONOABS 0.3 05/24/2016 1535   EOSABS 0.1 05/24/2016 1535   BASOSABS 0.1 05/24/2016 1535    Hgb A1C Lab Results  Component Value Date   HGBA1C 5.8 06/12/2015            Assessment & Plan:   Chronic low back pain:  Discussed treatment options Physiatry vs increase narcotic pain medication She is not interested in injections at her age Will refill Norco 7.5-325 mg TID She understands  she will only be given 1 RX monthly [place] CRS reviewed- no other prescribers  RTC as needed or if symptoms persist or worsen Ermal Brzozowski, NP

## 2016-08-05 NOTE — Patient Instructions (Signed)
Back Pain, Adult Introduction Back pain is very common. The pain often gets better over time. The cause of back pain is usually not dangerous. Most people can learn to manage their back pain on their own. Follow these instructions at home: Watch your back pain for any changes. The following actions may help to lessen any pain you are feeling:  Stay active. Start with short walks on flat ground if you can. Try to walk farther each day.  Exercise regularly as told by your doctor. Exercise helps your back heal faster. It also helps avoid future injury by keeping your muscles strong and flexible.  Do not sit, drive, or stand in one place for more than 30 minutes.  Do not stay in bed. Resting more than 1-2 days can slow down your recovery.  Be careful when you bend or lift an object. Use good form when lifting:  Bend at your knees.  Keep the object close to your body.  Do not twist.  Sleep on a firm mattress. Lie on your side, and bend your knees. If you lie on your back, put a pillow under your knees.  Take medicines only as told by your doctor.  Put ice on the injured area.  Put ice in a plastic bag.  Place a towel between your skin and the bag.  Leave the ice on for 20 minutes, 2-3 times a day for the first 2-3 days. After that, you can switch between ice and heat packs.  Avoid feeling anxious or stressed. Find good ways to deal with stress, such as exercise.  Maintain a healthy weight. Extra weight puts stress on your back. Contact a doctor if:  You have pain that does not go away with rest or medicine.  You have worsening pain that goes down into your legs or buttocks.  You have pain that does not get better in one week.  You have pain at night.  You lose weight.  You have a fever or chills. Get help right away if:  You cannot control when you poop (bowel movement) or pee (urinate).  Your arms or legs feel weak.  Your arms or legs lose feeling  (numbness).  You feel sick to your stomach (nauseous) or throw up (vomit).  You have belly (abdominal) pain.  You feel like you may pass out (faint). This information is not intended to replace advice given to you by your health care provider. Make sure you discuss any questions you have with your health care provider. Document Released: 12/01/2007 Document Revised: 11/20/2015 Document Reviewed: 10/16/2013  2017 Elsevier  

## 2016-08-09 ENCOUNTER — Telehealth: Payer: Self-pay | Admitting: *Deleted

## 2016-08-09 NOTE — Telephone Encounter (Signed)
Called to request that her appt for May be moved up to ASAP. She is having a great deal of pain in her back for the past 3 weeks and she is afraid that the cancer has spread to her bones. Please advise.

## 2016-08-09 NOTE — Telephone Encounter (Signed)
Appt for 215 per Vickki Muff OK'ed and accepted

## 2016-08-09 NOTE — Telephone Encounter (Signed)
Per Dr Rogue Bussing, can see her tomorrow in [place] office at 11:30. Jill Bowers will call me back to see if she can tale this appt due to transportation

## 2016-08-10 ENCOUNTER — Inpatient Hospital Stay: Payer: Medicare Other | Attending: Internal Medicine | Admitting: Internal Medicine

## 2016-08-10 VITALS — BP 119/67 | HR 73 | Temp 99.5°F | Wt 208.2 lb

## 2016-08-10 DIAGNOSIS — R5383 Other fatigue: Secondary | ICD-10-CM | POA: Insufficient documentation

## 2016-08-10 DIAGNOSIS — R938 Abnormal findings on diagnostic imaging of other specified body structures: Secondary | ICD-10-CM

## 2016-08-10 DIAGNOSIS — M545 Low back pain: Secondary | ICD-10-CM | POA: Diagnosis not present

## 2016-08-10 DIAGNOSIS — Z85038 Personal history of other malignant neoplasm of large intestine: Secondary | ICD-10-CM | POA: Diagnosis not present

## 2016-08-10 DIAGNOSIS — R109 Unspecified abdominal pain: Secondary | ICD-10-CM | POA: Diagnosis not present

## 2016-08-10 DIAGNOSIS — R202 Paresthesia of skin: Secondary | ICD-10-CM | POA: Diagnosis not present

## 2016-08-10 DIAGNOSIS — R079 Chest pain, unspecified: Secondary | ICD-10-CM | POA: Insufficient documentation

## 2016-08-10 DIAGNOSIS — I2510 Atherosclerotic heart disease of native coronary artery without angina pectoris: Secondary | ICD-10-CM | POA: Insufficient documentation

## 2016-08-10 DIAGNOSIS — K219 Gastro-esophageal reflux disease without esophagitis: Secondary | ICD-10-CM | POA: Insufficient documentation

## 2016-08-10 DIAGNOSIS — C189 Malignant neoplasm of colon, unspecified: Secondary | ICD-10-CM | POA: Insufficient documentation

## 2016-08-10 DIAGNOSIS — G8929 Other chronic pain: Secondary | ICD-10-CM | POA: Diagnosis not present

## 2016-08-10 DIAGNOSIS — C772 Secondary and unspecified malignant neoplasm of intra-abdominal lymph nodes: Secondary | ICD-10-CM | POA: Diagnosis not present

## 2016-08-10 DIAGNOSIS — R5381 Other malaise: Secondary | ICD-10-CM | POA: Insufficient documentation

## 2016-08-10 DIAGNOSIS — R978 Other abnormal tumor markers: Secondary | ICD-10-CM | POA: Diagnosis not present

## 2016-08-10 DIAGNOSIS — Z7982 Long term (current) use of aspirin: Secondary | ICD-10-CM | POA: Insufficient documentation

## 2016-08-10 DIAGNOSIS — I110 Hypertensive heart disease with heart failure: Secondary | ICD-10-CM | POA: Diagnosis not present

## 2016-08-10 DIAGNOSIS — K5900 Constipation, unspecified: Secondary | ICD-10-CM | POA: Diagnosis not present

## 2016-08-10 DIAGNOSIS — E041 Nontoxic single thyroid nodule: Secondary | ICD-10-CM | POA: Diagnosis not present

## 2016-08-10 DIAGNOSIS — Z86718 Personal history of other venous thrombosis and embolism: Secondary | ICD-10-CM | POA: Insufficient documentation

## 2016-08-10 DIAGNOSIS — Z86711 Personal history of pulmonary embolism: Secondary | ICD-10-CM | POA: Insufficient documentation

## 2016-08-10 DIAGNOSIS — G8928 Other chronic postprocedural pain: Secondary | ICD-10-CM | POA: Diagnosis not present

## 2016-08-10 DIAGNOSIS — R531 Weakness: Secondary | ICD-10-CM | POA: Insufficient documentation

## 2016-08-10 DIAGNOSIS — K7689 Other specified diseases of liver: Secondary | ICD-10-CM

## 2016-08-10 DIAGNOSIS — R600 Localized edema: Secondary | ICD-10-CM | POA: Insufficient documentation

## 2016-08-10 DIAGNOSIS — I509 Heart failure, unspecified: Secondary | ICD-10-CM | POA: Insufficient documentation

## 2016-08-10 DIAGNOSIS — R0609 Other forms of dyspnea: Secondary | ICD-10-CM | POA: Insufficient documentation

## 2016-08-10 DIAGNOSIS — Z79899 Other long term (current) drug therapy: Secondary | ICD-10-CM | POA: Insufficient documentation

## 2016-08-10 DIAGNOSIS — M549 Dorsalgia, unspecified: Secondary | ICD-10-CM | POA: Diagnosis not present

## 2016-08-10 DIAGNOSIS — Z800 Family history of malignant neoplasm of digestive organs: Secondary | ICD-10-CM | POA: Insufficient documentation

## 2016-08-10 NOTE — Progress Notes (Signed)
Patient here today for abdominal and lower back pain, daughter states that pt often will not get out of bed due to back pain.

## 2016-08-10 NOTE — Progress Notes (Signed)
[facility] OFFICE PROGRESS NOTE  Patient Care Team: Jearld Fenton, NP as PCP - General (Internal Medicine)   SUMMARY OF ONCOLOGIC HISTORY:  Oncology History   # July 2016- COLON CANCER; mod diff adeno; STAGE III (pT4aN1a; MSI-STABLE) [s/p surgery;Dr. Arbutus Ped [place]];No adjuvant chemo sec to co-morbdities DEC 2016- CT-C/ A/P [[facility]]- subcentimeter hepatic cysts ; irregular soft tissue density in the peritoneal fat within pelvis ? Scarring  [less likely recurrence];   # DEC 2017- CT-NED [but CEA rising ~20]  # July R LE DVT/Bil PE s/p IVC filter; Hx of GIB; AKI; Hx of Severe Anemia; hx CHF     Colon cancer metastasized to intra-abdominal lymph node ([place])     INTERVAL HISTORY: Patient is a poor historian. Daughter accompanying the patient.  83 year old female patient with above history of stage III with multiple comorbidities- unable to tolerate adjuvant chemotherapy is here for worsening pain in her lower back for the past 2-3 weeks.  She reports 8/10 low back pain, relieved with Norco as needed, currently taking twice daily, alternating with tylenol.  She reports being awakened by pain at night.  Medications relieve pain for a couple of hours at a time.  She also reports cramping abdominal pain from the navel up, and intermittent chest pain.    As per the daughter patient has chronic shortness of breath and dyspnea on exertion which is not any worse. She has chronic swelling in the legs.  She has noted that her weight has been fluctuating.  She reports fatigue and weakness.  She reports chronic tingling in her feet.  She reports constipation is controlled with daily Miralax and magnesium. She denies fever, chills, recent illness.  She denies nausea, vomiting, diarrhea, hematuria, and hematochezia.  She has no urinary complaints.  She offers no further specific complaints.    REVIEW OF SYSTEMS:   Review of Systems  Constitutional: Positive for malaise/fatigue.  Negative for chills, diaphoresis, fever and weight loss.  HENT: Negative for congestion.   Respiratory: Positive for shortness of breath.        Dyspnea on exertion  Cardiovascular: Positive for chest pain. Negative for leg swelling.  Gastrointestinal: Positive for abdominal pain. Negative for blood in stool, constipation, diarrhea, nausea and vomiting.  Genitourinary: Negative.  Negative for hematuria.  Musculoskeletal: Positive for back pain.  Neurological: Positive for tingling and weakness. Negative for headaches.  Psychiatric/Behavioral: The patient has insomnia.        Awakened by pain     PAST MEDICAL HISTORY :  Past Medical History:  Diagnosis Date  . Anemia   . Blood clot associated with vein wall inflammation   . CHF (congestive heart failure) ([place])   . Chicken pox   . Colon cancer ([place])   . Coronary artery disease   . DVT (deep venous thrombosis) ([place])   . Frequent headaches   . Gastrointestinal hemorrhage   . GERD (gastroesophageal reflux disease)   . Hyperkalemia   . Hypertension   . Malignant neoplasm of stomach ([place])   . PE (pulmonary embolism)   . Renal disorder   . Renal insufficiency   . Vitamin D deficiency     PAST SURGICAL HISTORY :   Past Surgical History:  Procedure Laterality Date  . ABDOMINAL HYSTERECTOMY    . COLON SURGERY    . IVC FILTER PLACEMENT ([facility] HX)    . JOINT REPLACEMENT Right    knee    FAMILY HISTORY :   Family  History  Problem Relation Age of Onset  . Arthritis Mother   . Hypertension Mother   . Colon cancer Sister   . Heart disease Daughter   . Colon cancer Son     SOCIAL HISTORY:   Social History  Substance Use Topics  . Smoking status: Never Smoker  . Smokeless tobacco: Never Used  . Alcohol use No    ALLERGIES:  is allergic to reglan [metoclopramide]; statins; and torsemide.  MEDICATIONS:  Current Outpatient Prescriptions  Medication Sig Dispense Refill  . acetaminophen (TYLENOL) 650 MG CR tablet Take 650  mg by mouth 3 (three) times daily with meals.    Marland Kitchen aspirin (ASPIRIN EC) 81 MG EC tablet Take 81 mg by mouth daily. Swallow whole.    . Cholecalciferol (VITAMIN D-3) 1000 UNITS CAPS Take 2 capsules by mouth daily.    . furosemide (LASIX) 40 MG tablet TAKE 1 TABLET (40 MG TOTAL) BY MOUTH 2 (TWO) TIMES DAILY AS NEEDED. 60 tablet 6  . HYDROcodone-acetaminophen (NORCO) 7.5-325 MG tablet Take 1 tablet by mouth every 8 (eight) hours as needed for moderate pain. 90 tablet 0  . Magnesium Oxide 500 MG TABS Take 1 tablet by mouth 2 (two) times daily.    Marland Kitchen omeprazole (PRILOSEC) 20 MG capsule Take 20 mg by mouth daily.    . Polyethylene Glycol 3350 (MIRALAX PO) Take by mouth daily.    Marland Kitchen spironolactone (ALDACTONE) 25 MG tablet TAKE 1 TABLET (25 MG TOTAL) BY MOUTH 2 (TWO) TIMES DAILY AS NEEDED. 60 tablet 3  . UNABLE TO FIND Take 1 tablet by mouth 2 (two) times daily. Med Name: Omega Red    . vitamin C (ASCORBIC ACID) 500 MG tablet Take 500 mg by mouth daily.     No current facility-administered medications for this visit.     PHYSICAL EXAMINATION: ECOG PERFORMANCE STATUS: 3 - Symptomatic, >50% confined to bed  BP 119/67 (BP Location: Left Arm, Patient Position: Sitting)   Pulse 73   Temp 99.5 F (37.5 C) (Tympanic)   Wt 208 lb 3.6 oz (94.4 kg)   BMI 36.89 kg/m   Filed Weights   08/10/16 1426  Weight: 208 lb 3.6 oz (94.4 kg)    Accompanied by her daughter.  ENERAL: Well-nourished well-developed; Alert, no acute distress. In wheel chair.  EYES: no pallor or icterus OROPHARYNX: no thrush or ulceration; good dentition  NECK: supple, no masses felt LYMPH: no palpable lymphadenopathy in the cervical, axillary or inguinal regions LUNGS: clear to auscultation and No wheeze or crackles HEART/CVS: regular rate & rhythm and no murmurs; bilateral lower extremity swelling. ABDOMEN: abdomen soft, non-tender and normal bowel sounds Musculoskeletal:no cyanosis of digits and no clubbing  PSYCH: alert &  oriented x 3 with fluent speech NEURO: no focal motor/sensory deficits SKIN: no rashes or significant lesions   LABORATORY DATA:  I have reviewed the data as listed    Component Value Date/Time   NA 143 07/02/2016 1031   NA 141 09/11/2015 0920   K 4.3 07/02/2016 1031   CL 106 07/02/2016 1031   CO2 29 07/02/2016 1031   GLUCOSE 106 (H) 07/02/2016 1031   BUN 17 07/02/2016 1031   BUN 25 09/11/2015 0920   CREATININE 1.26 (H) 07/02/2016 1031   CALCIUM 9.2 07/02/2016 1031   PROT 7.3 05/24/2016 1535   ALBUMIN 4.3 05/24/2016 1535   AST 18 05/24/2016 1535   ALT 10 (L) 05/24/2016 1535   ALKPHOS 58 05/24/2016 1535   BILITOT 0.5 05/24/2016  [place] (L) 07/02/2016 1031   GFRAA 44 (L) 07/02/2016 1031    No results found for: SPEP, UPEP  Lab Results  Component Value Date   WBC 4.4 07/02/2016   NEUTROABS 2.2 05/24/2016   HGB 12.8 07/02/2016   HCT 38.3 07/02/2016   MCV 90.8 07/02/2016   PLT 193 07/02/2016      Chemistry      Component Value Date/Time   NA 143 07/02/2016 1031   NA 141 09/11/2015 0920   K 4.3 07/02/2016 1031   CL 106 07/02/2016 1031   CO2 29 07/02/2016 1031   BUN 17 07/02/2016 1031   BUN 25 09/11/2015 0920   CREATININE 1.26 (H) 07/02/2016 1031      Component Value Date/Time   CALCIUM 9.2 07/02/2016 1031   ALKPHOS 58 05/24/2016 1535   AST 18 05/24/2016 1535   ALT 10 (L) 05/24/2016 1535   BILITOT 0.5 05/24/2016 1535       ASSESSMENT & PLAN:   Colon cancer metastasized to intra-abdominal lymph node ([place]) # Colon cancer-stage III [pT4N1a] with multiple liver cysts on the CT scan]. No adjuvant therapy given multiple comorbidities for performance status. DEC 2017- no evidence of recurrence. Reviewed tumor markers/CEA elevated- 20; rising. Discussed my concern that patient most likely having recurrence based on tumor marker given to not clinically evident on CT scan.   Patient's performance status continues to be ECOG 3; reviewed with the patient  and family that even though she has recurrent cancer/measurable cancer- difficult to offer treatments based upon her poor performance status and multiple comorbidities.  #Endometrial thickening- no vaginal bleeding. Recommend further follow up with PCP/GYN.  # Back pain - question arthritic origin vs tumor impingement. Chronic. Worsening.  Move up PET scan originally planned for May. Discussed relaxing breathing techniques and breath ratio chart with patient and patient's daughter for pain relief.  # Follow up: PET scan, followed by evaluation with Md/labs (CBC, CMP, CEA) a couple days later.  CC: [name], NP- [facility]; [place].   # I reviewed the plan with the patient and patient's daughter.   Lucendia Herrlich, NP  08/11/16 9:38 AM

## 2016-08-10 NOTE — Assessment & Plan Note (Addendum)
#   Colon cancer-stage III [pT4N1a] with multiple liver cysts on the CT scan]. No adjuvant therapy given multiple comorbidities for performance status. DEC 2017- no evidence of recurrence. Reviewed tumor markers/CEA elevated- 20; rising. Discussed my concern that patient most likely having recurrence based on tumor marker given to not clinically evident on CT scan.   Patient's performance status continues to be ECOG 3; reviewed with the patient and family that even though she has recurrent cancer/measurable cancer- difficult to offer treatments based upon her poor performance status and multiple comorbidities.  #Endometrial thickening- no vaginal bleeding. Recommend further follow up with PCP/GYN.  # Back pain - question arthritic origin vs tumor impingement. Chronic. Worsening.  Move up PET scan originally planned for May. Discussed relaxing breathing techniques and breath ratio chart with patient and patient's daughter for pain relief.  # Follow up: PET scan, followed by evaluation with Md/labs (CBC, CMP, CEA) a couple days later.  CC: [name], NP- [facility]; [place].   # I reviewed the plan with the patient and patient's daughter.

## 2016-08-13 ENCOUNTER — Ambulatory Visit
Admission: RE | Admit: 2016-08-13 | Discharge: 2016-08-13 | Disposition: A | Discharge status: Home / Self Care | Payer: Medicare Other | Source: Ambulatory Visit | Attending: Internal Medicine | Admitting: Internal Medicine

## 2016-08-13 DIAGNOSIS — C189 Malignant neoplasm of colon, unspecified: Secondary | ICD-10-CM | POA: Diagnosis not present

## 2016-08-13 DIAGNOSIS — E041 Nontoxic single thyroid nodule: Secondary | ICD-10-CM | POA: Diagnosis not present

## 2016-08-13 DIAGNOSIS — R938 Abnormal findings on diagnostic imaging of other specified body structures: Secondary | ICD-10-CM | POA: Insufficient documentation

## 2016-08-13 DIAGNOSIS — C772 Secondary and unspecified malignant neoplasm of intra-abdominal lymph nodes: Secondary | ICD-10-CM | POA: Insufficient documentation

## 2016-08-13 LAB — GLUCOSE, CAPILLARY: Glucose-Capillary: 97 mg/dL (ref 65–99)

## 2016-08-13 MED ORDER — FLUDEOXYGLUCOSE F - 18 (FDG) INJECTION
12.6200 | Freq: Once | INTRAVENOUS | Status: AC | PRN
  Administered 2016-08-13: 12.62 via INTRAVENOUS

## 2016-08-16 ENCOUNTER — Inpatient Hospital Stay (HOSPITAL_BASED_OUTPATIENT_CLINIC_OR_DEPARTMENT_OTHER): Payer: Medicare Other | Admitting: Internal Medicine

## 2016-08-16 ENCOUNTER — Inpatient Hospital Stay: Payer: Medicare Other

## 2016-08-16 VITALS — BP 123/79 | HR 66 | Temp 99.2°F | Wt 205.0 lb

## 2016-08-16 DIAGNOSIS — C189 Malignant neoplasm of colon, unspecified: Secondary | ICD-10-CM

## 2016-08-16 DIAGNOSIS — C772 Secondary and unspecified malignant neoplasm of intra-abdominal lymph nodes: Secondary | ICD-10-CM | POA: Diagnosis not present

## 2016-08-16 DIAGNOSIS — E041 Nontoxic single thyroid nodule: Secondary | ICD-10-CM | POA: Diagnosis not present

## 2016-08-16 DIAGNOSIS — R978 Other abnormal tumor markers: Secondary | ICD-10-CM | POA: Diagnosis not present

## 2016-08-16 DIAGNOSIS — Z86711 Personal history of pulmonary embolism: Secondary | ICD-10-CM

## 2016-08-16 DIAGNOSIS — I2510 Atherosclerotic heart disease of native coronary artery without angina pectoris: Secondary | ICD-10-CM

## 2016-08-16 DIAGNOSIS — M549 Dorsalgia, unspecified: Secondary | ICD-10-CM | POA: Diagnosis not present

## 2016-08-16 DIAGNOSIS — K7689 Other specified diseases of liver: Secondary | ICD-10-CM | POA: Diagnosis not present

## 2016-08-16 DIAGNOSIS — G8929 Other chronic pain: Secondary | ICD-10-CM

## 2016-08-16 DIAGNOSIS — R109 Unspecified abdominal pain: Secondary | ICD-10-CM

## 2016-08-16 DIAGNOSIS — G8928 Other chronic postprocedural pain: Secondary | ICD-10-CM | POA: Diagnosis not present

## 2016-08-16 DIAGNOSIS — I509 Heart failure, unspecified: Secondary | ICD-10-CM

## 2016-08-16 DIAGNOSIS — Z79899 Other long term (current) drug therapy: Secondary | ICD-10-CM

## 2016-08-16 DIAGNOSIS — I110 Hypertensive heart disease with heart failure: Secondary | ICD-10-CM | POA: Diagnosis not present

## 2016-08-16 DIAGNOSIS — R938 Abnormal findings on diagnostic imaging of other specified body structures: Secondary | ICD-10-CM | POA: Diagnosis not present

## 2016-08-16 DIAGNOSIS — Z85038 Personal history of other malignant neoplasm of large intestine: Secondary | ICD-10-CM | POA: Diagnosis not present

## 2016-08-16 DIAGNOSIS — Z7982 Long term (current) use of aspirin: Secondary | ICD-10-CM

## 2016-08-16 DIAGNOSIS — Z800 Family history of malignant neoplasm of digestive organs: Secondary | ICD-10-CM

## 2016-08-16 LAB — COMPREHENSIVE METABOLIC PANEL WITH GFR
ALT: 9 U/L — ABNORMAL LOW (ref 14–54)
AST: 18 U/L (ref 15–41)
Albumin: 3.8 g/dL (ref 3.5–5.0)
Alkaline Phosphatase: 70 U/L (ref 38–126)
Anion gap: 7 (ref 5–15)
BUN: 15 mg/dL (ref 6–20)
CO2: 28 mmol/L (ref 22–32)
Calcium: 9.1 mg/dL (ref 8.9–10.3)
Chloride: 104 mmol/L (ref 101–111)
Creatinine, Ser: 1.04 mg/dL — ABNORMAL HIGH (ref 0.44–1.00)
GFR calc Af Amer: 55 mL/min — ABNORMAL LOW
GFR calc non Af Amer: 48 mL/min — ABNORMAL LOW
Glucose, Bld: 126 mg/dL — ABNORMAL HIGH (ref 65–99)
Potassium: 3.5 mmol/L (ref 3.5–5.1)
Sodium: 139 mmol/L (ref 135–145)
Total Bilirubin: 0.7 mg/dL (ref 0.3–1.2)
Total Protein: 7.1 g/dL (ref 6.5–8.1)

## 2016-08-16 LAB — CBC WITH DIFFERENTIAL/PLATELET
Basophils Absolute: 0 K/uL (ref 0–0.1)
Basophils Relative: 0 %
Eosinophils Absolute: 0.1 K/uL (ref 0–0.7)
Eosinophils Relative: 1 %
HCT: 36.5 % (ref 35.0–47.0)
Hemoglobin: 12.2 g/dL (ref 12.0–16.0)
Lymphocytes Relative: 39 %
Lymphs Abs: 2 K/uL (ref 1.0–3.6)
MCH: 30.3 pg (ref 26.0–34.0)
MCHC: 33.5 g/dL (ref 32.0–36.0)
MCV: 90.7 fL (ref 80.0–100.0)
Monocytes Absolute: 0.6 K/uL (ref 0.2–0.9)
Monocytes Relative: 11 %
Neutro Abs: 2.5 K/uL (ref 1.4–6.5)
Neutrophils Relative %: 49 %
Platelets: 194 K/uL (ref 150–440)
RBC: 4.02 MIL/uL (ref 3.80–5.20)
RDW: 13.8 % (ref 11.5–14.5)
WBC: 5.2 K/uL (ref 3.6–11.0)

## 2016-08-16 MED ORDER — GABAPENTIN 100 MG PO CAPS: ORAL_CAPSULE | ORAL | 3 refills | Status: DC

## 2016-08-16 NOTE — Assessment & Plan Note (Addendum)
#   Colon cancer-stage III [pT4N1a] with multiple liver cysts on the CT scan]. No adjuvant therapy given multiple comorbidities for performance status. PET scan-no evidence of recurrence; shows thyroid nodule [C discussion below].   # Discussed at length with the patient's daughter regardingthe discrepancy between the rising CEA/ also negative imaging. Awaiting on the CEA.  # Abdominal pain- unclear etiology. Incisional question neuropathic. Recommend Neurontin 100 mg twice a day if tolerating well- then recommend 3 times a day.  #Endometrial thickening- no vaginal bleeding. Recommend further follow up with PCP/GYN.  # Left thyroid nodule- question malignancy; monitor for now given comorbidities at this time.  # Follow up in appx 6 weeks/ no labs; will call daughter- [phone] with CEA.  # # I reviewed the blood work- with the patient in detail; also reviewed the imaging independently [as summarized above]; and with the patient in detail.   # 25 minutes face-to-face with the patient discussing the above plan of care; more than 50% of time spent on prognosis/ natural history; counseling and coordination.  cC: [name], NP- [facility]; [place].

## 2016-08-16 NOTE — Progress Notes (Signed)
[place] OFFICE PROGRESS NOTE  Patient Care Team: Jearld Fenton, NP as PCP - General (Internal Medicine)   SUMMARY OF ONCOLOGIC HISTORY:  Oncology History   # July 2016- COLON CANCER; mod diff adeno; STAGE III (pT4aN1a; MSI-STABLE) [s/p surgery;Dr. Arbutus Ped [place]];No adjuvant chemo sec to co-morbdities DEC 2016- CT[facility]/ [facility] [[facility] subcentimeter hepatic cysts ; irregular soft tissue density in the peritoneal fat within pelvis ? Scarring  [less likely recurrence];   # DEC 2017- CT-NED [but CEA rising ~20]  # July R LE DVT/Bil PE s/p IVC filter; Hx of GIB; AKI; Hx of Severe Anemia; hx CHF     Colon cancer metastasized to intra-abdominal lymph node ([place])     INTERVAL HISTORY: Patient is a poor historian. Daughter accompanying the patient.  83 year old female patient with above history of stage III with multiple comorbidities- unable to tolerate adjuvant chemotherapy; And history of rising CEA- is here for follow-up/to review the results of the PET order for worsening abdominal pain.  Patient continues to complain of worsening abdominal pain around the site of the previous surgery. Denies any nausea vomiting or constipation.  As per the daughter she also has chronic back pain radiating- right lower extremity. She has been taking hydrocodone up to 2 pills a day.   REVIEW OF SYSTEMS:  A complete 10 point review of system is done which is negative except mentioned above/history of present illness.   PAST MEDICAL HISTORY :  Past Medical History:  Diagnosis Date  . Anemia   . Blood clot associated with vein wall inflammation   . CHF (congestive heart failure) ([place])   . Chicken pox   . Colon cancer ([place])   . Coronary artery disease   . DVT (deep venous thrombosis) ([place])   . Frequent headaches   . Gastrointestinal hemorrhage   . GERD (gastroesophageal reflux disease)   . Hyperkalemia   . Hypertension   . Malignant neoplasm of stomach ([place])   . PE (pulmonary  embolism)   . Renal disorder   . Renal insufficiency   . Vitamin D deficiency     PAST SURGICAL HISTORY :   Past Surgical History:  Procedure Laterality Date  . ABDOMINAL HYSTERECTOMY    . COLON SURGERY    . IVC FILTER PLACEMENT ([facility] HX)    . JOINT REPLACEMENT Right    knee    FAMILY HISTORY :   Family History  Problem Relation Age of Onset  . Arthritis Mother   . Hypertension Mother   . Colon cancer Sister   . Heart disease Daughter   . Colon cancer Son     SOCIAL HISTORY:   Social History  Substance Use Topics  . Smoking status: Never Smoker  . Smokeless tobacco: Never Used  . Alcohol use No    ALLERGIES:  is allergic to reglan [metoclopramide]; statins; and torsemide.  MEDICATIONS:  Current Outpatient Prescriptions  Medication Sig Dispense Refill  . acetaminophen (TYLENOL) 650 MG CR tablet Take 650 mg by mouth 3 (three) times daily with meals.    Marland Kitchen aspirin (ASPIRIN EC) 81 MG EC tablet Take 81 mg by mouth daily. Swallow whole.    . Cholecalciferol (VITAMIN D-3) 1000 UNITS CAPS Take 2 capsules by mouth daily.    . furosemide (LASIX) 40 MG tablet TAKE 1 TABLET (40 MG TOTAL) BY MOUTH 2 (TWO) TIMES DAILY AS NEEDED. 60 tablet 6  . HYDROcodone-acetaminophen (NORCO) 7.5-325 MG tablet Take 1 tablet by mouth every 8 (  eight) hours as needed for moderate pain. 90 tablet 0  . Magnesium Oxide 500 MG TABS Take 1 tablet by mouth 2 (two) times daily.    Marland Kitchen omeprazole (PRILOSEC) 20 MG capsule Take 20 mg by mouth daily.    . Polyethylene Glycol 3350 (MIRALAX PO) Take by mouth daily.    Marland Kitchen spironolactone (ALDACTONE) 25 MG tablet TAKE 1 TABLET (25 MG TOTAL) BY MOUTH 2 (TWO) TIMES DAILY AS NEEDED. 60 tablet 3  . UNABLE TO FIND Take 1 tablet by mouth 2 (two) times daily. Med Name: Omega Red    . vitamin C (ASCORBIC ACID) 500 MG tablet Take 500 mg by mouth daily.    Marland Kitchen gabapentin (NEURONTIN) 100 MG capsule Take one pill twice a day x1 week; if no side effects then take one pill thre times  a day 90 capsule 3   No current facility-administered medications for this visit.     PHYSICAL EXAMINATION: ECOG PERFORMANCE STATUS: 3 - Symptomatic, >50% confined to bed  BP 123/79 (BP Location: Left Arm, Patient Position: Sitting)   Pulse 66   Temp 99.2 F (37.3 C) (Tympanic)   Wt 205 lb (93 kg)   BMI 36.31 kg/m   Filed Weights   08/16/16 1415  Weight: 205 lb (93 kg)    Accompanied by her daughter.  ENERAL: Well-nourished well-developed; Alert, no distress and comfortable. In wheel chair.  EYES: no pallor or icterus OROPHARYNX: no thrush or ulceration; good dentition  NECK: supple, no masses felt LYMPH: no palpable lymphadenopathy in the cervical, axillary or inguinal regions LUNGS: clear to auscultation and No wheeze or crackles HEART/CVS: regular rate & rhythm and no murmurs; bilateral lower extremity swelling. ABDOMEN: abdomen soft, non-tender and normal bowel sounds Musculoskeletal:no cyanosis of digits and no clubbing  PSYCH: alert & oriented x 3 with fluent speech NEURO: no focal motor/sensory deficits SKIN: no rashes or significant lesions   LABORATORY DATA:  I have reviewed the data as listed    Component Value Date/Time   NA 139 08/16/2016 1333   NA 141 09/11/2015 0920   K 3.5 08/16/2016 1333   CL 104 08/16/2016 1333   CO2 28 08/16/2016 1333   GLUCOSE 126 (H) 08/16/2016 1333   BUN 15 08/16/2016 1333   BUN 25 09/11/2015 0920   CREATININE 1.04 (H) 08/16/2016 1333   CALCIUM 9.1 08/16/2016 1333   PROT 7.1 08/16/2016 1333   ALBUMIN 3.8 08/16/2016 1333   AST 18 08/16/2016 1333   ALT 9 (L) 08/16/2016 1333   ALKPHOS 70 08/16/2016 1333   BILITOT 0.7 08/16/2016 1333   GFRNONAA 48 (L) 08/16/2016 1333   GFRAA 55 (L) 08/16/2016 1333    No results found for: SPEP, UPEP  Lab Results  Component Value Date   WBC 5.2 08/16/2016   NEUTROABS 2.5 08/16/2016   HGB 12.2 08/16/2016   HCT 36.5 08/16/2016   MCV 90.7 08/16/2016   PLT 194 08/16/2016       Chemistry      Component Value Date/Time   NA 139 08/16/2016 1333   NA 141 09/11/2015 0920   K 3.5 08/16/2016 1333   CL 104 08/16/2016 1333   CO2 28 08/16/2016 1333   BUN 15 08/16/2016 1333   BUN 25 09/11/2015 0920   CREATININE 1.04 (H) 08/16/2016 1333      Component Value Date/Time   CALCIUM 9.1 08/16/2016 1333   ALKPHOS 70 08/16/2016 1333   AST 18 08/16/2016 1333   ALT 9 (L) 08/16/2016  1333   BILITOT 0.7 08/16/2016 1333       ASSESSMENT & PLAN:   Colon cancer metastasized to intra-abdominal lymph node ([place]) # Colon cancer-stage III [pT4N1a] with multiple liver cysts on the CT scan]. No adjuvant therapy given multiple comorbidities for performance status. PET scan-no evidence of recurrence; shows thyroid nodule [C discussion below].   # Discussed at length with the patient's daughter regardingthe discrepancy between the rising CEA/ also negative imaging. Awaiting on the CEA.  # Abdominal pain- unclear etiology. Incisional question neuropathic. Recommend Neurontin 100 mg twice a day if tolerating well- then recommend 3 times a day.  #Endometrial thickening- no vaginal bleeding. Recommend further follow up with PCP/GYN.  # Left thyroid nodule- question malignancy; monitor for now given comorbidities at this time.  # Follow up in appx 6 weeks/ no labs; will call daughter- [phone] with CEA.  # # I reviewed the blood work- with the patient in detail; also reviewed the imaging independently [as summarized above]; and with the patient in detail.   # 25 minutes face-to-face with the patient discussing the above plan of care; more than 50% of time spent on prognosis/ natural history; counseling and coordination.  cC: [name], NP- [facility]; [place].      Cammie Sickle, MD 08/16/2016 4:06 PM

## 2016-08-16 NOTE — Progress Notes (Signed)
Patient here today for follow up.  Patient c/o lower abdominal pain 8/10.

## 2016-08-17 ENCOUNTER — Telehealth: Payer: Self-pay | Admitting: *Deleted

## 2016-08-17 LAB — CEA: CEA: 20 ng/mL — AB (ref 0.0–4.7)

## 2016-08-17 NOTE — Telephone Encounter (Signed)
RN Spoke with [name]- patient's daughter. Daughter made aware of pt's labs results. Reassured daughter that her mom's labs were stable. No new changes in plan of care at this time.

## 2016-08-17 NOTE — Telephone Encounter (Signed)
-----   Message from Cammie Sickle, MD sent at 08/17/2016  2:17 PM EST ----- Please inform daughter re: CEA ; elevated but STABLE. Daughters number is in my last note; follow up as planned.  Thx

## 2016-08-21 ENCOUNTER — Emergency Department: Payer: Medicare Other

## 2016-08-21 ENCOUNTER — Observation Stay
Admission: EM | Admit: 2016-08-21 | Discharge: 2016-08-25 | Disposition: A | Discharge status: Home / Self Care | Payer: Medicare Other | Attending: Internal Medicine | Admitting: Internal Medicine

## 2016-08-21 ENCOUNTER — Encounter: Payer: Self-pay | Admitting: Emergency Medicine

## 2016-08-21 DIAGNOSIS — I5032 Chronic diastolic (congestive) heart failure: Secondary | ICD-10-CM | POA: Insufficient documentation

## 2016-08-21 DIAGNOSIS — K625 Hemorrhage of anus and rectum: Secondary | ICD-10-CM | POA: Diagnosis present

## 2016-08-21 DIAGNOSIS — Z86718 Personal history of other venous thrombosis and embolism: Secondary | ICD-10-CM | POA: Diagnosis not present

## 2016-08-21 DIAGNOSIS — E875 Hyperkalemia: Secondary | ICD-10-CM | POA: Diagnosis not present

## 2016-08-21 DIAGNOSIS — I700 Atherosclerosis of aorta: Secondary | ICD-10-CM | POA: Insufficient documentation

## 2016-08-21 DIAGNOSIS — C189 Malignant neoplasm of colon, unspecified: Secondary | ICD-10-CM | POA: Diagnosis not present

## 2016-08-21 DIAGNOSIS — I739 Peripheral vascular disease, unspecified: Secondary | ICD-10-CM | POA: Insufficient documentation

## 2016-08-21 DIAGNOSIS — Z90710 Acquired absence of both cervix and uterus: Secondary | ICD-10-CM | POA: Insufficient documentation

## 2016-08-21 DIAGNOSIS — N289 Disorder of kidney and ureter, unspecified: Secondary | ICD-10-CM | POA: Insufficient documentation

## 2016-08-21 DIAGNOSIS — C772 Secondary and unspecified malignant neoplasm of intra-abdominal lymph nodes: Secondary | ICD-10-CM | POA: Diagnosis not present

## 2016-08-21 DIAGNOSIS — K640 First degree hemorrhoids: Secondary | ICD-10-CM | POA: Diagnosis not present

## 2016-08-21 DIAGNOSIS — Z85028 Personal history of other malignant neoplasm of stomach: Secondary | ICD-10-CM | POA: Diagnosis not present

## 2016-08-21 DIAGNOSIS — K5790 Diverticulosis of intestine, part unspecified, without perforation or abscess without bleeding: Secondary | ICD-10-CM | POA: Diagnosis not present

## 2016-08-21 DIAGNOSIS — K5731 Diverticulosis of large intestine without perforation or abscess with bleeding: Secondary | ICD-10-CM | POA: Diagnosis not present

## 2016-08-21 DIAGNOSIS — I110 Hypertensive heart disease with heart failure: Secondary | ICD-10-CM | POA: Diagnosis not present

## 2016-08-21 DIAGNOSIS — D649 Anemia, unspecified: Secondary | ICD-10-CM | POA: Insufficient documentation

## 2016-08-21 DIAGNOSIS — K219 Gastro-esophageal reflux disease without esophagitis: Secondary | ICD-10-CM | POA: Insufficient documentation

## 2016-08-21 DIAGNOSIS — R51 Headache: Secondary | ICD-10-CM | POA: Diagnosis not present

## 2016-08-21 DIAGNOSIS — N281 Cyst of kidney, acquired: Secondary | ICD-10-CM | POA: Diagnosis not present

## 2016-08-21 DIAGNOSIS — Z79899 Other long term (current) drug therapy: Secondary | ICD-10-CM | POA: Insufficient documentation

## 2016-08-21 DIAGNOSIS — E559 Vitamin D deficiency, unspecified: Secondary | ICD-10-CM | POA: Diagnosis not present

## 2016-08-21 DIAGNOSIS — I2510 Atherosclerotic heart disease of native coronary artery without angina pectoris: Secondary | ICD-10-CM | POA: Diagnosis not present

## 2016-08-21 DIAGNOSIS — D259 Leiomyoma of uterus, unspecified: Secondary | ICD-10-CM | POA: Diagnosis not present

## 2016-08-21 DIAGNOSIS — Z8261 Family history of arthritis: Secondary | ICD-10-CM | POA: Insufficient documentation

## 2016-08-21 DIAGNOSIS — K4090 Unilateral inguinal hernia, without obstruction or gangrene, not specified as recurrent: Secondary | ICD-10-CM | POA: Insufficient documentation

## 2016-08-21 DIAGNOSIS — Z86711 Personal history of pulmonary embolism: Secondary | ICD-10-CM | POA: Diagnosis not present

## 2016-08-21 DIAGNOSIS — I10 Essential (primary) hypertension: Secondary | ICD-10-CM | POA: Diagnosis not present

## 2016-08-21 DIAGNOSIS — K5730 Diverticulosis of large intestine without perforation or abscess without bleeding: Secondary | ICD-10-CM

## 2016-08-21 DIAGNOSIS — K7689 Other specified diseases of liver: Secondary | ICD-10-CM | POA: Diagnosis not present

## 2016-08-21 DIAGNOSIS — Z980 Intestinal bypass and anastomosis status: Secondary | ICD-10-CM | POA: Diagnosis not present

## 2016-08-21 DIAGNOSIS — Z96651 Presence of right artificial knee joint: Secondary | ICD-10-CM | POA: Insufficient documentation

## 2016-08-21 DIAGNOSIS — Z800 Family history of malignant neoplasm of digestive organs: Secondary | ICD-10-CM | POA: Insufficient documentation

## 2016-08-21 DIAGNOSIS — Z8249 Family history of ischemic heart disease and other diseases of the circulatory system: Secondary | ICD-10-CM | POA: Insufficient documentation

## 2016-08-21 DIAGNOSIS — R109 Unspecified abdominal pain: Secondary | ICD-10-CM | POA: Diagnosis not present

## 2016-08-21 DIAGNOSIS — K5900 Constipation, unspecified: Secondary | ICD-10-CM | POA: Diagnosis not present

## 2016-08-21 DIAGNOSIS — Z7982 Long term (current) use of aspirin: Secondary | ICD-10-CM | POA: Insufficient documentation

## 2016-08-21 DIAGNOSIS — Z888 Allergy status to other drugs, medicaments and biological substances status: Secondary | ICD-10-CM | POA: Diagnosis not present

## 2016-08-21 LAB — COMPREHENSIVE METABOLIC PANEL
ALBUMIN: 3.6 g/dL (ref 3.5–5.0)
ALK PHOS: 71 U/L (ref 38–126)
ALT: 8 U/L — ABNORMAL LOW (ref 14–54)
ANION GAP: 7 (ref 5–15)
AST: 15 U/L (ref 15–41)
BILIRUBIN TOTAL: 0.5 mg/dL (ref 0.3–1.2)
BUN: 17 mg/dL (ref 6–20)
CALCIUM: 9 mg/dL (ref 8.9–10.3)
CO2: 28 mmol/L (ref 22–32)
Chloride: 102 mmol/L (ref 101–111)
Creatinine, Ser: 1.08 mg/dL — ABNORMAL HIGH (ref 0.44–1.00)
GFR calc non Af Amer: 45 mL/min — ABNORMAL LOW (ref >60)
GFR, EST AFRICAN AMERICAN: 53 mL/min — AB (ref >60)
GLUCOSE: 133 mg/dL — AB (ref 65–99)
POTASSIUM: 3.8 mmol/L (ref 3.5–5.1)
SODIUM: 137 mmol/L (ref 135–145)
TOTAL PROTEIN: 7 g/dL (ref 6.5–8.1)

## 2016-08-21 LAB — HEMOGLOBIN: Hemoglobin: 11.8 g/dL — ABNORMAL LOW (ref 12.0–16.0)

## 2016-08-21 LAB — CBC
HEMATOCRIT: 36 % (ref 35.0–47.0)
HEMOGLOBIN: 12.2 g/dL (ref 12.0–16.0)
MCH: 30.9 pg (ref 26.0–34.0)
MCHC: 33.9 g/dL (ref 32.0–36.0)
MCV: 91.1 fL (ref 80.0–100.0)
Platelets: 216 10*3/uL (ref 150–440)
RBC: 3.96 MIL/uL (ref 3.80–5.20)
RDW: 14 % (ref 11.5–14.5)
WBC: 6.1 10*3/uL (ref 3.6–11.0)

## 2016-08-21 LAB — URINALYSIS, COMPLETE (UACMP) WITH MICROSCOPIC
BILIRUBIN URINE: NEGATIVE
Glucose, UA: NEGATIVE mg/dL
HGB URINE DIPSTICK: NEGATIVE
KETONES UR: NEGATIVE mg/dL
Leukocytes, UA: NEGATIVE
NITRITE: NEGATIVE
PROTEIN: NEGATIVE mg/dL
Specific Gravity, Urine: 1.032 — ABNORMAL HIGH (ref 1.005–1.030)
WBC UA: NONE SEEN WBC/hpf (ref 0–5)
pH: 6 (ref 5.0–8.0)

## 2016-08-21 LAB — LIPASE, BLOOD: Lipase: 46 U/L (ref 11–51)

## 2016-08-21 LAB — TYPE AND SCREEN
ABO/RH(D): O POS
ANTIBODY SCREEN: NEGATIVE

## 2016-08-21 MED ORDER — ACETAMINOPHEN 325 MG PO TABS: 650.0000 mg | ORAL_TABLET | Freq: Four times a day (QID) | ORAL | Status: DC | PRN

## 2016-08-21 MED ORDER — HYDROCODONE-ACETAMINOPHEN 7.5-325 MG PO TABS
1.0000 | ORAL_TABLET | Freq: Three times a day (TID) | ORAL | Status: DC | PRN
  Administered 2016-08-22 – 2016-08-24 (×2): 1 via ORAL
  Filled 2016-08-21 (×2): qty 1

## 2016-08-21 MED ORDER — SPIRONOLACTONE 25 MG PO TABS
25.0000 mg | ORAL_TABLET | Freq: Every day | ORAL | Status: DC
  Administered 2016-08-22 – 2016-08-25 (×3): 25 mg via ORAL
  Filled 2016-08-21 (×3): qty 1

## 2016-08-21 MED ORDER — PANTOPRAZOLE SODIUM 40 MG PO TBEC
40.0000 mg | DELAYED_RELEASE_TABLET | Freq: Every day | ORAL | Status: DC
  Administered 2016-08-22 – 2016-08-25 (×3): 40 mg via ORAL
  Filled 2016-08-21 (×3): qty 1

## 2016-08-21 MED ORDER — VITAMIN D 1000 UNITS PO TABS
2000.0000 [IU] | ORAL_TABLET | Freq: Every day | ORAL | Status: DC
  Administered 2016-08-22 – 2016-08-25 (×3): 2000 [IU] via ORAL
  Filled 2016-08-21 (×3): qty 2

## 2016-08-21 MED ORDER — IOPAMIDOL (ISOVUE-300) INJECTION 61%
30.0000 mL | Freq: Once | INTRAVENOUS | Status: AC | PRN
  Administered 2016-08-21: 30 mL via ORAL

## 2016-08-21 MED ORDER — SODIUM CHLORIDE 0.9 % IV SOLN
INTRAVENOUS | Status: DC
Start: 2016-08-21 — End: 2016-08-22
  Administered 2016-08-21: 21:00:00 via INTRAVENOUS

## 2016-08-21 MED ORDER — POLYETHYLENE GLYCOL 3350 17 G PO PACK
17.0000 g | PACK | Freq: Every day | ORAL | Status: DC
  Administered 2016-08-22 – 2016-08-23 (×2): 17 g via ORAL
  Filled 2016-08-21 (×3): qty 1

## 2016-08-21 MED ORDER — ACETAMINOPHEN 650 MG RE SUPP: 650.0000 mg | Freq: Four times a day (QID) | RECTAL | Status: DC | PRN

## 2016-08-21 MED ORDER — IOPAMIDOL (ISOVUE-300) INJECTION 61%
100.0000 mL | Freq: Once | INTRAVENOUS | Status: AC | PRN
  Administered 2016-08-21: 100 mL via INTRAVENOUS

## 2016-08-21 MED ORDER — VITAMIN C 500 MG PO TABS
500.0000 mg | ORAL_TABLET | Freq: Every day | ORAL | Status: DC
  Administered 2016-08-22 – 2016-08-25 (×3): 500 mg via ORAL
  Filled 2016-08-21 (×3): qty 1

## 2016-08-21 MED ORDER — ONDANSETRON HCL 4 MG/2ML IJ SOLN: 4.0000 mg | Freq: Four times a day (QID) | INTRAMUSCULAR | Status: DC | PRN

## 2016-08-21 MED ORDER — ONDANSETRON HCL 4 MG PO TABS: 4.0000 mg | ORAL_TABLET | Freq: Four times a day (QID) | ORAL | Status: DC | PRN

## 2016-08-21 MED ORDER — MAGNESIUM OXIDE 400 (241.3 MG) MG PO TABS
400.0000 mg | ORAL_TABLET | Freq: Two times a day (BID) | ORAL | Status: DC
  Administered 2016-08-21 – 2016-08-25 (×7): 400 mg via ORAL
  Filled 2016-08-21 (×7): qty 1

## 2016-08-21 NOTE — H&P (Signed)
[facility] [place] NAME: Jill Bowers    MR#:  [id number]  DATE OF BIRTH:  Mar 21, 1929  DATE OF ADMISSION:  08/21/2016  PRIMARY CARE PHYSICIAN: Jill Silversmith, NP   REQUESTING/REFERRING PHYSICIAN: Dr. Lenise Bowers  CHIEF COMPLAINT:   Chief Complaint  Patient presents with  . Blood In Stools  . Abdominal Pain    HISTORY OF PRESENT ILLNESS:  Jill Bowers  is a 83 y.o. female with a known history of  Congestive heart failure, colon cancer in remission, history of DVT and PE status post IVC filter and not on anticoagulation, CK D, chronic anemia presents to hospital secondary to rectal bleeding. Patient has intermittent confusion due to mild cognitive deficits at baseline, so most of the history is obtained from daughter at bedside. According to daughter patient ambulates with a walker and uses a bedside commode. She has noticed that patient has complained of abdominal pain prior to having a bowel movement, and there was blood in the bed side commode mixed with loose stool. Patient takes MiraLAX every day for her constipation. For colon cancer, she had surgery done about a year and half ago and was noted to be in remission. Recent visit with her oncologist 3 days ago showed a stable but elevated CEA, but CT of the abdomen has been negative for any recurrence. She is being observed for that. CT of the abdomen today in the emergency room did not show any recurrence of cancer but has diverticulosis. She hasn't had further bleeding here. Her hemoglobin is noted to be stable here.  PAST MEDICAL HISTORY:   Past Medical History:  Diagnosis Date  . Anemia   . Blood clot associated with vein wall inflammation   . CHF (congestive heart failure) ([place])   . Chicken pox   . Colon cancer ([place])   . Coronary artery disease   . DVT (deep venous thrombosis) ([place])   . Frequent headaches   . Gastrointestinal hemorrhage   . GERD (gastroesophageal reflux  disease)   . Hyperkalemia   . Hypertension   . Malignant neoplasm of stomach ([place])   . PE (pulmonary embolism)   . Renal disorder   . Renal insufficiency   . Vitamin D deficiency     PAST SURGICAL HISTORY:   Past Surgical History:  Procedure Laterality Date  . ABDOMINAL HYSTERECTOMY    . COLON SURGERY    . IVC FILTER PLACEMENT ([facility] HX)    . JOINT REPLACEMENT Right    knee    SOCIAL HISTORY:   Social History  Substance Use Topics  . Smoking status: Never Smoker  . Smokeless tobacco: Never Used  . Alcohol use No    FAMILY HISTORY:   Family History  Problem Relation Age of Onset  . Arthritis Mother   . Hypertension Mother   . Colon cancer Sister   . Heart disease Daughter   . Colon cancer Son   . Pancreatic cancer Son     DRUG ALLERGIES:   Allergies  Allergen Reactions  . Reglan [Metoclopramide] Other (See Comments)  . Statins Other (See Comments)  . Torsemide Other (See Comments)    REVIEW OF SYSTEMS:   Review of Systems  Constitutional: Positive for malaise/fatigue. Negative for chills, fever and weight loss.  HENT: Positive for hearing loss. Negative for ear discharge, ear pain and nosebleeds.   Eyes: Positive for blurred vision. Negative for double vision and photophobia.  Respiratory: Negative for cough,  hemoptysis, shortness of breath and wheezing.   Cardiovascular: Negative for chest pain, palpitations, orthopnea and leg swelling.  Gastrointestinal: Positive for abdominal pain and blood in stool. Negative for constipation, diarrhea, heartburn, melena, nausea and vomiting.  Genitourinary: Negative for dysuria, frequency, hematuria and urgency.  Musculoskeletal: Negative for back pain, myalgias and neck pain.  Skin: Negative for rash.  Neurological: Negative for dizziness, sensory change, speech change and focal weakness.  Endo/Heme/Allergies: Does not bruise/bleed easily.  Psychiatric/Behavioral: Negative for depression.    MEDICATIONS AT HOME:     Prior to Admission medications   Medication Sig Start Date End Date Taking? Authorizing Provider  acetaminophen (TYLENOL) 650 MG CR tablet Take 650 mg by mouth 3 (three) times daily with meals.   Yes Historical Provider, MD  aspirin (ASPIRIN EC) 81 MG EC tablet Take 81 mg by mouth daily. Swallow whole.   Yes Historical Provider, MD  Cholecalciferol (VITAMIN D-3) 1000 UNITS CAPS Take 2 capsules by mouth daily.   Yes Historical Provider, MD  furosemide (LASIX) 40 MG tablet TAKE 1 TABLET (40 MG TOTAL) BY MOUTH 2 (TWO) TIMES DAILY AS NEEDED. Patient taking differently: Take 40 mg by mouth daily.  03/10/16  Yes Minna Merritts, MD  HYDROcodone-acetaminophen (NORCO) 7.5-325 MG tablet Take 1 tablet by mouth every 8 (eight) hours as needed for moderate pain. 08/05/16  Yes Jearld Fenton, NP  Magnesium Oxide 500 MG TABS Take 1 tablet by mouth 2 (two) times daily.   Yes Historical Provider, MD  omeprazole (PRILOSEC) 20 MG capsule Take 20 mg by mouth daily.   Yes Historical Provider, MD  Polyethylene Glycol 3350 (MIRALAX PO) Take by mouth daily.   Yes Historical Provider, MD  spironolactone (ALDACTONE) 25 MG tablet TAKE 1 TABLET (25 MG TOTAL) BY MOUTH 2 (TWO) TIMES DAILY AS NEEDED. Patient taking differently: Take 25 mg by mouth daily.  08/02/16  Yes Minna Merritts, MD  UNABLE TO FIND Take 1 tablet by mouth 2 (two) times daily. Med Name: Omega Red   Yes Historical Provider, MD  vitamin C (ASCORBIC ACID) 500 MG tablet Take 500 mg by mouth daily.   Yes Historical Provider, MD  gabapentin (NEURONTIN) 100 MG capsule Take one pill twice a day x1 week; if no side effects then take one pill thre times a day Patient not taking: Reported on 08/21/2016 08/16/16   Cammie Sickle, MD      VITAL SIGNS:  Blood pressure (!) 121/54, pulse (!) 52, temperature 98.7 F (37.1 C), temperature source Oral, resp. rate 20, height 5\' 3"  (1.6 m), weight 93 kg (205 lb), SpO2 97 %.  PHYSICAL EXAMINATION:   Physical  Exam  GENERAL:  83 y.o.-year-old elderly patient lying in the bed with no acute distress.  EYES: Pupils equal, round, reactive to light and accommodation. No scleral icterus. Extraocular muscles intact.  HEENT: Head atraumatic, normocephalic. Oropharynx and nasopharynx clear. Pale conjunctiva NECK:  Supple, no jugular venous distention. No thyroid enlargement, no tenderness.  LUNGS: Normal breath sounds bilaterally, no wheezing, rales,rhonchi or crepitation. No use of accessory muscles of respiration. Decreased bibasilar breath sounds CARDIOVASCULAR: S1, S2 normal. No murmurs, rubs, or gallops.  ABDOMEN: Soft, nontender, nondistended. Bowel sounds present. No organomegaly or mass.  EXTREMITIES: No cyanosis, or clubbing. 1+ bilateral ankle edema NEUROLOGIC: Cranial nerves II through XII are intact. Muscle strength 4/5 in all extremities. Sensation intact. Gait not checked. Global weakness noted. PSYCHIATRIC: The patient is alert and oriented x 3. Intermittent confusion SKIN:  No obvious rash, lesion, or ulcer.   LABORATORY PANEL:   CBC  Recent Labs Lab 08/21/16 1346  WBC 6.1  HGB 12.2  HCT 36.0  PLT 216   ------------------------------------------------------------------------------------------------------------------  Chemistries   Recent Labs Lab 08/21/16 1346  NA 137  K 3.8  CL 102  CO2 28  GLUCOSE 133*  BUN 17  CREATININE 1.08*  CALCIUM 9.0  AST 15  ALT 8*  ALKPHOS 71  BILITOT 0.5   ------------------------------------------------------------------------------------------------------------------  Cardiac Enzymes No results for input(s): TROPONINI in the last 168 hours. ------------------------------------------------------------------------------------------------------------------  RADIOLOGY:  Ct Abdomen Pelvis W Contrast  Result Date: 08/21/2016 CLINICAL DATA:  Bloody stools and generalized abdominal pain. Colon cancer. EXAM: CT ABDOMEN AND PELVIS WITH  CONTRAST TECHNIQUE: Multidetector CT imaging of the abdomen and pelvis was performed using the standard protocol following bolus administration of intravenous contrast. CONTRAST:  111mL ISOVUE-300 IOPAMIDOL (ISOVUE-300) INJECTION 61% COMPARISON:  06/02/2016 and PET-CT 08/13/2016. FINDINGS: Lower chest: Atelectasis right base. Elevated right hemidiaphragm. Stable cardiomegaly. Calcified plaque over the left main left anterior descending coronary arteries. Hepatobiliary: Stable liver hypodensities compatible with cysts. Gallbladder and biliary tree within normal. Pancreas: Stable appearance of mild prominence of the main pancreatic duct with stable multiple small cystic areas throughout the pancreas. Spleen: Within normal. Adrenals/Urinary Tract: Adrenal glands are normal. Kidneys are normal in size without hydronephrosis or nephrolithiasis. Stable 1.2 cm cyst over the upper pole right kidney. Mild left renal cortical scarring unchanged. Ureters are normal in caliber without stones. Bladder is within normal. Stomach/Bowel: Stomach and small bowel are within normal. Appendix is not visualized. Surgical suture line is present over the cecum. Moderate diverticulosis throughout the colon most prominent over the sigmoid colon. Vascular/Lymphatic: Mild calcified plaque throughout the abdominal aorta and iliac arteries. IVC filter below the renal veins. Prominent ovarian veins right greater than left. Remaining vascular structures are unremarkable. No significant adenopathy. Reproductive: Several small calcified fibroids. Visualize ovaries are within normal. Other: No significant free fluid or focal inflammatory change. Small left inguinal hernia containing only peritoneal fat. Musculoskeletal: There are degenerative changes of the spine and hips. Subtle grade 1 anterolisthesis of L5 on S1 unchanged. IMPRESSION: No acute findings in the abdomen/pelvis. Moderate colonic diverticulosis without active inflammation. Stable  hepatic cysts.  Stable right renal cyst. Mild cardiomegaly. Atherosclerotic coronary artery disease. Aortic atherosclerosis. Stable appearance of the pancreas with prominence of the main pancreatic duct and several scattered small cystic lesions. Small left inguinal hernia containing only peritoneal fat. Electronically Signed   By: Marin Olp M.D.   On: 08/21/2016 17:15    EKG:   Orders placed or performed in visit on 07/30/16  . EKG 12-Lead    IMPRESSION AND PLAN:   Anjel Nunnally  is a 83 y.o. female with a known history of  Congestive heart failure, colon cancer in remission, history of DVT and PE status post IVC filter and not on anticoagulation, CK D, chronic anemia presents to hospital secondary to rectal bleeding.  #1 Rectal bleeding- likely diverticular bleeding - monitor, liquid diet and advance as tolerated -Hemoglobin checks every 8 hours. - GI consult - hold aspirin  #2 Colon cancer- stage 3, s/p surgery, in remission, elevated but stable CEA - no further recommendations per oncology, on observation - CT of the abdomen with only diverticulosis today  #3 GERD- protonix  #4 CHF- hold lasix, on aldactone low dose  #5 DVT Prophylaxis- TEDs and SCDs  Physical Therapy consult    All the records are  reviewed and case discussed with ED provider. Management plans discussed with the patient, family and they are in agreement.  CODE STATUS: Full Code  TOTAL TIME TAKING CARE OF THIS PATIENT: 50 minutes.    Gladstone Lighter M.D on 08/21/2016 at 6:41 PM  Between 7am to 6pm - Pager - [phone]  After 6pm go to [email or web address] - password [email or web address] [facility]  Office  [phone]  CC: Primary care physician; Jill Silversmith, NP

## 2016-08-21 NOTE — ED Notes (Signed)
Pt transported to room 108 

## 2016-08-21 NOTE — ED Triage Notes (Signed)
Patient states the pain is intermittent, hurts worse with movement at times.

## 2016-08-21 NOTE — ED Provider Notes (Signed)
[facility] Emergency Department Provider Note        Time seen: ----------------------------------------- 3:17 PM on 08/21/2016 -----------------------------------------    I have reviewed the triage vital signs and the nursing notes.   HISTORY  Chief Complaint Blood In Stools and Abdominal Pain    HPI Jill Bowers is a 83 y.o. female who presents to ER for rectal bleeding. Patient's daughter states she went to the patient's bedroom to empty her bedside commode noticed a large amount of blood in the toilet. Patient takes MiraLAX daily so the stools are always loose. She has history of colon resection in 2016 reportedly has colon cancer. She has not had any rectal bleeding since last year. Patient reports to an area inferior to the umbilicus as the location of her pain. She denies any nausea or fever.   Past Medical History:  Diagnosis Date  . Anemia   . Blood clot associated with vein wall inflammation   . CHF (congestive heart failure) ([place])   . Chicken pox   . Colon cancer ([place])   . Coronary artery disease   . DVT (deep venous thrombosis) ([place])   . Frequent headaches   . Gastrointestinal hemorrhage   . GERD (gastroesophageal reflux disease)   . Hyperkalemia   . Hypertension   . Malignant neoplasm of stomach ([place])   . PE (pulmonary embolism)   . Renal disorder   . Renal insufficiency   . Vitamin D deficiency     Patient Active Problem List   Diagnosis Date Noted  . PAD (peripheral artery disease) ([place]) 07/14/2015  . Symptomatic bradycardia 07/02/2015  . Chronic congestive heart failure ([place]) 06/12/2015  . Essential hypertension 06/12/2015  . Vitamin D deficiency 06/12/2015  . History of DVT 06/12/2015  . History of pulmonary embolus ([place]) 06/12/2015  . GERD (gastroesophageal reflux disease) 06/12/2015  . Colon cancer metastasized to intra-abdominal lymph node ([place]) 06/02/2015    Past Surgical History:  Procedure Laterality Date  .  ABDOMINAL HYSTERECTOMY    . COLON SURGERY    . IVC FILTER PLACEMENT ([facility] HX)    . JOINT REPLACEMENT Right    knee    Allergies Reglan [metoclopramide]; Statins; and Torsemide  Social History Social History  Substance Use Topics  . Smoking status: Never Smoker  . Smokeless tobacco: Never Used  . Alcohol use No    Review of Systems Constitutional: Negative for fever. Cardiovascular: Negative for chest pain. Respiratory: Negative for shortness of breath. Gastrointestinal: Positive for abdominal pain, rectal bleeding Genitourinary: Negative for dysuria. Musculoskeletal: Negative for back pain. Skin: Negative for rash. Neurological: Negative for headaches, focal weakness or numbness.  10-point ROS otherwise negative.  ____________________________________________   PHYSICAL EXAM:  VITAL SIGNS: ED Triage Vitals  Enc Vitals Group     BP 08/21/16 1322 132/60     Pulse Rate 08/21/16 1322 70     Resp 08/21/16 1322 18     Temp 08/21/16 1322 98.7 F (37.1 C)     Temp Source 08/21/16 1322 Oral     SpO2 08/21/16 1322 97 %     Weight 08/21/16 1318 205 lb (93 kg)     Height 08/21/16 1318 5\' 3"  (1.6 m)     Head Circumference --      Peak Flow --      Pain Score 08/21/16 1319 0     Pain Loc --      Pain Edu? --      Excl. in [place]? --  Constitutional: Alert and oriented. Well appearing and in no distress. Eyes: Conjunctivae are normal. PERRL. Normal extraocular movements. ENT   Head: Normocephalic and atraumatic.   Nose: No congestion/rhinnorhea.   Mouth/Throat: Mucous membranes are moist.   Neck: No stridor. Cardiovascular: Normal rate, regular rhythm. No murmurs, rubs, or gallops. Respiratory: Normal respiratory effort without tachypnea nor retractions. Breath sounds are clear and equal bilaterally. No wheezes/rales/rhonchi. Gastrointestinal: Soft, nonfocal lower abdominal tenderness. No rebound or guarding. Normal bowel sounds. Musculoskeletal: Nontender  with normal range of motion in all extremities. No lower extremity tenderness nor edema. Neurologic:  Normal speech and language. No gross focal neurologic deficits are appreciated.  Skin:  Skin is warm, dry and intact. No rash noted. Psychiatric: Mood and affect are normal. Speech and behavior are normal.  ____________________________________________  ED COURSE:  Pertinent labs & imaging results that were available during my care of the patient were reviewed by me and considered in my medical decision making (see chart for details). Patient presents to ER with rectal bleeding and intermittent abdominal pain for the past several months. We will assess with labs and CT imaging.   Procedures ____________________________________________   LABS (pertinent positives/negatives)  Labs Reviewed  COMPREHENSIVE METABOLIC PANEL - Abnormal; Notable for the following:       Result Value   Glucose, Bld 133 (*)    Creatinine, Ser 1.08 (*)    ALT 8 (*)    GFR calc non Af Amer 45 (*)    GFR calc Af Amer 53 (*)    All other components within normal limits  LIPASE, BLOOD  CBC  URINALYSIS, COMPLETE (UACMP) WITH MICROSCOPIC  TYPE AND SCREEN    RADIOLOGY Images were viewed by me  CT of the abdomen and pelvis with contrast IMPRESSION: No acute findings in the abdomen/pelvis.  Moderate colonic diverticulosis without active inflammation.  Stable hepatic cysts.  Stable right renal cyst.  Mild cardiomegaly. Atherosclerotic coronary artery disease. Aortic atherosclerosis.  Stable appearance of the pancreas with prominence of the main pancreatic duct and several scattered small cystic lesions.  Small left inguinal hernia containing only peritoneal fat.  ____________________________________________  FINAL ASSESSMENT AND PLAN  Abdominal pain, rectal bleeding, Likely diverticulosis  Plan: Patient with labs and imaging as dictated above. Patient presented to the ER with bleeding likely  secondary to diverticulosis. Family notes very large amount of bloody stools at home, she had additional rectal bleeding here. I will discuss with the hospitalist for admission.   Earleen Newport, MD   Note: This note was generated in part or whole with voice recognition software. Voice recognition is usually quite accurate but there are transcription errors that can and very often do occur. I apologize for any typographical errors that were not detected and corrected.     Earleen Newport, MD 08/21/16 [phone]

## 2016-08-21 NOTE — ED Notes (Signed)
Attempted IV access x2.  ?

## 2016-08-21 NOTE — ED Notes (Signed)
Pt assisted to bathroom with minimal assistance.

## 2016-08-21 NOTE — ED Notes (Signed)
Pt back from CT

## 2016-08-21 NOTE — ED Notes (Signed)
Patient transported to CT 

## 2016-08-21 NOTE — ED Triage Notes (Signed)
Pt's daughter states she went in the patients bedroom to empty her bedside commode and notice a lot of blood mixed in her stool.  Pt takes miralax daily so stool is always loose. Pt had hx of colon resection in 2016. Pt c/o abdominal pain in navel area in the same area where her surgery was.

## 2016-08-21 NOTE — Care Management Obs Status (Signed)
[facility] NOTIFICATION   Patient Details  Name: Jill Bowers MRN: [id number] Date of Birth: July 07, 1928   Medicare Observation Status Notification Given:  Yes    CrutchfieldAntony Haste, RN 08/21/2016, 7:00 PM

## 2016-08-22 DIAGNOSIS — K625 Hemorrhage of anus and rectum: Secondary | ICD-10-CM | POA: Diagnosis not present

## 2016-08-22 DIAGNOSIS — I10 Essential (primary) hypertension: Secondary | ICD-10-CM | POA: Diagnosis not present

## 2016-08-22 DIAGNOSIS — C189 Malignant neoplasm of colon, unspecified: Secondary | ICD-10-CM | POA: Diagnosis not present

## 2016-08-22 DIAGNOSIS — K5790 Diverticulosis of intestine, part unspecified, without perforation or abscess without bleeding: Secondary | ICD-10-CM | POA: Diagnosis not present

## 2016-08-22 LAB — BASIC METABOLIC PANEL
Anion gap: 8 (ref 5–15)
BUN: 14 mg/dL (ref 6–20)
CALCIUM: 8.7 mg/dL — AB (ref 8.9–10.3)
CO2: 29 mmol/L (ref 22–32)
CREATININE: 0.89 mg/dL (ref 0.44–1.00)
Chloride: 102 mmol/L (ref 101–111)
GFR calc Af Amer: >60 mL/min (ref >60)
GFR calc non Af Amer: 57 mL/min — ABNORMAL LOW (ref >60)
GLUCOSE: 107 mg/dL — AB (ref 65–99)
Potassium: 3.6 mmol/L (ref 3.5–5.1)
Sodium: 139 mmol/L (ref 135–145)

## 2016-08-22 LAB — CBC
HCT: 32.5 % — ABNORMAL LOW (ref 35.0–47.0)
HEMOGLOBIN: 11.3 g/dL — AB (ref 12.0–16.0)
MCH: 31.4 pg (ref 26.0–34.0)
MCHC: 34.7 g/dL (ref 32.0–36.0)
MCV: 90.4 fL (ref 80.0–100.0)
PLATELETS: 203 10*3/uL (ref 150–440)
RBC: 3.6 MIL/uL — ABNORMAL LOW (ref 3.80–5.20)
RDW: 13.7 % (ref 11.5–14.5)
WBC: 5.7 10*3/uL (ref 3.6–11.0)

## 2016-08-22 LAB — HEMOGLOBIN
HEMOGLOBIN: 11.5 g/dL — AB (ref 12.0–16.0)
Hemoglobin: 12.3 g/dL (ref 12.0–16.0)

## 2016-08-22 MED ORDER — SODIUM CHLORIDE 0.9 % IV SOLN
INTRAVENOUS | Status: DC
  Administered 2016-08-23 (×2): via INTRAVENOUS

## 2016-08-22 MED ORDER — PEG 3350-KCL-NA BICARB-NACL 420 G PO SOLR
4000.0000 mL | Freq: Once | ORAL | Status: AC
  Administered 2016-08-22: 4000 mL via ORAL
  Filled 2016-08-22: qty 4000

## 2016-08-22 NOTE — Plan of Care (Signed)
Problem: Pain Managment: Goal: General experience of comfort will improve Outcome: Progressing Norco given once for abd pain with improvement.  Problem: Nutrition: Goal: Adequate nutrition will be maintained Outcome: Progressing Pt on clear liquid diet. Denies n/v. GoLYTELY initiated. Plan for colonoscopy tomorrow. Informed consent signed.  Problem: Bowel/Gastric: Goal: Will not experience complications related to bowel motility Outcome: Progressing 2x bloody stool noted during the shift. Last Hgb 11.5.

## 2016-08-22 NOTE — Progress Notes (Signed)
Pt completed jug of golytely

## 2016-08-22 NOTE — Progress Notes (Signed)
PT Refusal Note  Patient Details Name: Jill Bowers MRN: [id number] DOB: [birth year]   Cancelled Treatment:    Reason Eval/Treat Not Completed: Patient declined, no reason specified . Chart reviewed. Attempted to perform evaluation however pt currently refusing. She states that her abdominal pain has just improved and she does not feel well enough to get up at this time. Pt agreeable to PT evaluation on later date. Will attempt PT evaluation on later date as pt is willing to participate.  Lyndel Safe Aishi Courts PT, DPT   Halcyon Heck 08/22/2016, 10:47 AM

## 2016-08-22 NOTE — Progress Notes (Signed)
[facility] [place] NAME: Bethanne Lundstedt    MR#:  [id number]  DATE OF BIRTH:  [birth year]  SUBJECTIVE:  CHIEF COMPLAINT:   Chief Complaint  Patient presents with  . Blood In Stools  . Abdominal Pain   The patient complains of abdominal pain, but no nausea, vomiting or diarrhea, no rectal bleeding today. REVIEW OF SYSTEMS:  Review of Systems  Constitutional: Positive for malaise/fatigue. Negative for chills and fever.  HENT: Negative for sinus pain and sore throat.   Eyes: Negative for blurred vision and double vision.  Respiratory: Negative for cough and shortness of breath.   Cardiovascular: Negative for chest pain and leg swelling.  Gastrointestinal: Positive for abdominal pain.  Genitourinary: Negative for dysuria and hematuria.  Musculoskeletal: Negative for joint pain.  Neurological: Negative for dizziness, focal weakness, seizures, loss of consciousness and weakness.  Psychiatric/Behavioral: Negative for depression. The patient is not nervous/anxious.     DRUG ALLERGIES:   Allergies  Allergen Reactions  . Reglan [Metoclopramide] Other (See Comments)  . Statins Other (See Comments)  . Torsemide Other (See Comments)   VITALS:  Blood pressure (!) 129/48, pulse 71, temperature 98 F (36.7 C), temperature source Oral, resp. rate 19, height 5\' 3"  (1.6 m), weight 205 lb 9.6 oz (93.3 kg), SpO2 100 %. PHYSICAL EXAMINATION:  Physical Exam  Constitutional: She is oriented to person, place, and time and well-developed, well-nourished, and in no distress.  HENT:  Head: Normocephalic.  Mouth/Throat: Oropharynx is clear and moist.  Eyes: Conjunctivae and EOM are normal.  Neck: Normal range of motion. Neck supple. No JVD present. No tracheal deviation present.  Cardiovascular: Normal rate, regular rhythm and normal heart sounds.  Exam reveals no gallop.   No murmur heard. Pulmonary/Chest: Effort normal and breath sounds normal.    Abdominal: Soft. Bowel sounds are normal. She exhibits no distension. There is no tenderness.  Musculoskeletal: Normal range of motion. She exhibits no edema or tenderness.  Neurological: She is alert and oriented to person, place, and time. No cranial nerve deficit.  Skin: No rash noted. No erythema.  Psychiatric: Affect normal.   LABORATORY PANEL:  Female CBC  Recent Labs Lab 08/22/16 0426  WBC 5.7  HGB 11.3*  HCT 32.5*  PLT 203   ------------------------------------------------------------------------------------------------------------------ Chemistries   Recent Labs Lab 08/21/16 1346 08/22/16 0426  NA 137 139  K 3.8 3.6  CL 102 102  CO2 28 29  GLUCOSE 133* 107*  BUN 17 14  CREATININE 1.08* 0.89  CALCIUM 9.0 8.7*  AST 15  --   ALT 8*  --   ALKPHOS 71  --   BILITOT 0.5  --    RADIOLOGY:  Ct Abdomen Pelvis W Contrast  Result Date: 08/21/2016 CLINICAL DATA:  Bloody stools and generalized abdominal pain. Colon cancer. EXAM: CT ABDOMEN AND PELVIS WITH CONTRAST TECHNIQUE: Multidetector CT imaging of the abdomen and pelvis was performed using the standard protocol following bolus administration of intravenous contrast. CONTRAST:  155mL ISOVUE-300 IOPAMIDOL (ISOVUE-300) INJECTION 61% COMPARISON:  06/02/2016 and PET-CT 08/13/2016. FINDINGS: Lower chest: Atelectasis right base. Elevated right hemidiaphragm. Stable cardiomegaly. Calcified plaque over the left main left anterior descending coronary arteries. Hepatobiliary: Stable liver hypodensities compatible with cysts. Gallbladder and biliary tree within normal. Pancreas: Stable appearance of mild prominence of the main pancreatic duct with stable multiple small cystic areas throughout the pancreas. Spleen: Within normal. Adrenals/Urinary Tract: Adrenal glands are normal. Kidneys are normal in size without  hydronephrosis or nephrolithiasis. Stable 1.2 cm cyst over the upper pole right kidney. Mild left renal cortical scarring  unchanged. Ureters are normal in caliber without stones. Bladder is within normal. Stomach/Bowel: Stomach and small bowel are within normal. Appendix is not visualized. Surgical suture line is present over the cecum. Moderate diverticulosis throughout the colon most prominent over the sigmoid colon. Vascular/Lymphatic: Mild calcified plaque throughout the abdominal aorta and iliac arteries. IVC filter below the renal veins. Prominent ovarian veins right greater than left. Remaining vascular structures are unremarkable. No significant adenopathy. Reproductive: Several small calcified fibroids. Visualize ovaries are within normal. Other: No significant free fluid or focal inflammatory change. Small left inguinal hernia containing only peritoneal fat. Musculoskeletal: There are degenerative changes of the spine and hips. Subtle grade 1 anterolisthesis of L5 on S1 unchanged. IMPRESSION: No acute findings in the abdomen/pelvis. Moderate colonic diverticulosis without active inflammation. Stable hepatic cysts.  Stable right renal cyst. Mild cardiomegaly. Atherosclerotic coronary artery disease. Aortic atherosclerosis. Stable appearance of the pancreas with prominence of the main pancreatic duct and several scattered small cystic lesions. Small left inguinal hernia containing only peritoneal fat. Electronically Signed   By: Marin Olp M.D.   On: 08/21/2016 17:15   ASSESSMENT AND PLAN:   Annalei Massaquoi  is a 83 y.o. female with a known history of  Congestive heart failure, colon cancer in remission, history of DVT and PE status post IVC filter and not on anticoagulation, CK D, chronic anemia presents to hospital secondary to rectal bleeding.  #1 Rectal bleeding- likely diverticular bleeding On liquid diet and advance as tolerated -Hemoglobin decreased to 11.3 from 12.2. f/u GI consult - hold aspirin  #2 Colon cancer- stage 3, s/p surgery, in remission, elevated but stable CEA - no further recommendations per  oncology, on observation - CT of the abdomen with only diverticulosis today  #3 GERD- protonix  #4 Chronic diastolic CHF 0000000- hold lasix, on aldactone low dose  #5 DVT Prophylaxis- TEDs and SCDs  All the records are reviewed and case discussed with Care Management/Social Worker. Management plans discussed with the patient, family and they are in agreement.  CODE STATUS: Full Code  TOTAL TIME TAKING CARE OF THIS PATIENT: 26 minutes.   More than 50% of the time was spent in counseling/coordination of care: YES  POSSIBLE D/C IN 1 DAYS, DEPENDING ON CLINICAL CONDITION.   Demetrios Loll M.D on 08/22/2016 at 11:42 AM  Between 7am to 6pm - Pager - [phone]  After 6pm go to [email or web address] - Proofreader  [facility]  Office  [phone]  CC: Primary care physician; Webb Silversmith, NP  Note: This dictation was prepared with Dragon dictation along with smaller phrase technology. Any transcriptional errors that result from this process are unintentional.

## 2016-08-22 NOTE — Consult Note (Signed)
Consultation  Referring Provider:     No ref. provider found Primary Care Physician:  Webb Silversmith, NP Primary Gastroenterologist:  Unknown         Reason for Consultation:     Rectal bleeding  Date of Admission:  08/21/2016 Date of Consultation:  08/22/2016         HPI:   Jill Bowers is a 83 y.o. female  With PHM as detailed below, presents with rectal bleeding since yesterday noticed by her daughter who takes care of her. She had atleast 2 episodes since yesterday, moderate amounts mixed with stool. She has h/o colon cancer, underwent surgery in [place] in 2016, positive for LNs and she was offered chemo but patient decided not to undergo at that time even though she was aware that there is risk for recurrence. She also has been experiencing peri umbilical pain radiating to back intermittently which got worse in last 3weeks. Not a/w/n/v/weight loss/LOA/constipation/diarrhea. CT in ER showed diverticulosis, no acute pathology seen. She has mildly dilated main PD which has been stable on imaging. She is hemodynamically stable and Hb ranges between 11-12. She did not have a colonoscopy since surgery for surveillance.   Past Medical History:  Diagnosis Date  . Anemia   . Blood clot associated with vein wall inflammation   . CHF (congestive heart failure) ([place])   . Chicken pox   . Colon cancer ([facility])   . Coronary artery disease   . DVT (deep venous thrombosis) ([place])   . Frequent headaches   . Gastrointestinal hemorrhage   . GERD (gastroesophageal reflux disease)   . Hyperkalemia   . Hypertension   . Malignant neoplasm of stomach ([facility])   . PE (pulmonary embolism)   . Renal disorder   . Renal insufficiency   . Vitamin D deficiency     Past Surgical History:  Procedure Laterality Date  . ABDOMINAL HYSTERECTOMY    . COLON SURGERY    . IVC FILTER PLACEMENT ([facility] HX)    . JOINT REPLACEMENT Right    knee    Prior to Admission medications   Medication Sig Start Date End  Date Taking? Authorizing Provider  acetaminophen (TYLENOL) 650 MG CR tablet Take 650 mg by mouth 3 (three) times daily with meals.   Yes Historical Provider, MD  aspirin (ASPIRIN EC) 81 MG EC tablet Take 81 mg by mouth daily. Swallow whole.   Yes Historical Provider, MD  Cholecalciferol (VITAMIN D-3) 1000 UNITS CAPS Take 2 capsules by mouth daily.   Yes Historical Provider, MD  furosemide (LASIX) 40 MG tablet TAKE 1 TABLET (40 MG TOTAL) BY MOUTH 2 (TWO) TIMES DAILY AS NEEDED. Patient taking differently: Take 40 mg by mouth daily.  03/10/16  Yes Minna Merritts, MD  HYDROcodone-acetaminophen (NORCO) 7.5-325 MG tablet Take 1 tablet by mouth every 8 (eight) hours as needed for moderate pain. 08/05/16  Yes Jearld Fenton, NP  Magnesium Oxide 500 MG TABS Take 1 tablet by mouth 2 (two) times daily.   Yes Historical Provider, MD  omeprazole (PRILOSEC) 20 MG capsule Take 20 mg by mouth daily.   Yes Historical Provider, MD  Polyethylene Glycol 3350 (MIRALAX PO) Take by mouth daily.   Yes Historical Provider, MD  spironolactone (ALDACTONE) 25 MG tablet TAKE 1 TABLET (25 MG TOTAL) BY MOUTH 2 (TWO) TIMES DAILY AS NEEDED. Patient taking differently: Take 25 mg by mouth daily.  08/02/16  Yes Minna Merritts, MD  UNABLE TO FIND Take  1 tablet by mouth 2 (two) times daily. Med Name: Omega Red   Yes Historical Provider, MD  vitamin C (ASCORBIC ACID) 500 MG tablet Take 500 mg by mouth daily.   Yes Historical Provider, MD  gabapentin (NEURONTIN) 100 MG capsule Take one pill twice a day x1 week; if no side effects then take one pill thre times a day Patient not taking: Reported on 08/21/2016 08/16/16   Cammie Sickle, MD    Family History  Problem Relation Age of Onset  . Arthritis Mother   . Hypertension Mother   . Colon cancer Sister   . Heart disease Daughter   . Colon cancer Son   . Pancreatic cancer Son      Social History  Substance Use Topics  . Smoking status: Never Smoker  . Smokeless tobacco:  Never Used  . Alcohol use No    Allergies as of 08/21/2016 - Review Complete 08/21/2016  Allergen Reaction Noted  . Reglan [metoclopramide] Other (See Comments) 05/16/2015  . Statins Other (See Comments) 05/16/2015  . Torsemide Other (See Comments) 05/16/2015    Review of Systems:    All systems reviewed and negative except where noted in HPI.   Physical Exam:  Vital signs in last 24 hours: Temp:  [98 F (36.7 C)-98.7 F (37.1 C)] 98 F (36.7 C) (02/25 0425) Pulse Rate:  [51-74] 71 (02/25 0910) Resp:  [17-27] 19 (02/25 0910) BP: (121-154)/(40-81) 129/48 (02/25 0910) SpO2:  [95 %-100 %] 100 % (02/25 0910) Weight:  [93 kg (205 lb)-93.3 kg (205 lb 9.6 oz)] 93.3 kg (205 lb 9.6 oz) (02/24 2002) Last BM Date: 08/21/16 General:   Pleasant, cooperative in NAD Head:  Normocephalic and atraumatic. Eyes:   No icterus.   Conjunctiva pink. PERRLA. Ears:  Normal auditory acuity. Neck:  Supple; no masses or thyroidomegaly Lungs: Respirations even and unlabored. Lungs clear to auscultation bilaterally.   No wheezes, crackles, or rhonchi.  Heart:  Regular rate and rhythm;  Without murmur, clicks, rubs or gallops Abdomen:  Soft, nondistended, nontender, obese, mid line scar tissue periumbilical from prior surgery,mildly tender. Normal bowel sounds. No appreciable masses or hepatomegaly.  No rebound or guarding.  Rectal:  Not performed. Msk:  Symmetrical without gross deformities.  Strength  Extremities:  Without edema, cyanosis or clubbing. Neurologic:  Alert and oriented x3;  grossly normal neurologically. Skin:  Intact without significant lesions or rashes. Cervical Nodes:  No significant cervical adenopathy. Psych:  Alert and cooperative. Normal affect.  LAB RESULTS:  Recent Labs  08/21/16 1346 08/21/16 2027 08/22/16 0426  WBC 6.1  --  5.7  HGB 12.2 11.8* 11.3*  HCT 36.0  --  32.5*  PLT 216  --  203   BMET  Recent Labs  08/21/16 1346 08/22/16 0426  NA 137 139  K 3.8 3.6    CL 102 102  CO2 28 29  GLUCOSE 133* 107*  BUN 17 14  CREATININE 1.08* 0.89  CALCIUM 9.0 8.7*   LFT  Recent Labs  08/21/16 1346  PROT 7.0  ALBUMIN 3.6  AST 15  ALT 8*  ALKPHOS 71  BILITOT 0.5   PT/INR No results for input(s): LABPROT, INR in the last 72 hours.  STUDIES: Ct Abdomen Pelvis W Contrast  Result Date: 08/21/2016 CLINICAL DATA:  Bloody stools and generalized abdominal pain. Colon cancer. EXAM: CT ABDOMEN AND PELVIS WITH CONTRAST TECHNIQUE: Multidetector CT imaging of the abdomen and pelvis was performed using the standard protocol following bolus administration  of intravenous contrast. CONTRAST:  141mL ISOVUE-300 IOPAMIDOL (ISOVUE-300) INJECTION 61% COMPARISON:  06/02/2016 and PET-CT 08/13/2016. FINDINGS: Lower chest: Atelectasis right base. Elevated right hemidiaphragm. Stable cardiomegaly. Calcified plaque over the left main left anterior descending coronary arteries. Hepatobiliary: Stable liver hypodensities compatible with cysts. Gallbladder and biliary tree within normal. Pancreas: Stable appearance of mild prominence of the main pancreatic duct with stable multiple small cystic areas throughout the pancreas. Spleen: Within normal. Adrenals/Urinary Tract: Adrenal glands are normal. Kidneys are normal in size without hydronephrosis or nephrolithiasis. Stable 1.2 cm cyst over the upper pole right kidney. Mild left renal cortical scarring unchanged. Ureters are normal in caliber without stones. Bladder is within normal. Stomach/Bowel: Stomach and small bowel are within normal. Appendix is not visualized. Surgical suture line is present over the cecum. Moderate diverticulosis throughout the colon most prominent over the sigmoid colon. Vascular/Lymphatic: Mild calcified plaque throughout the abdominal aorta and iliac arteries. IVC filter below the renal veins. Prominent ovarian veins right greater than left. Remaining vascular structures are unremarkable. No significant  adenopathy. Reproductive: Several small calcified fibroids. Visualize ovaries are within normal. Other: No significant free fluid or focal inflammatory change. Small left inguinal hernia containing only peritoneal fat. Musculoskeletal: There are degenerative changes of the spine and hips. Subtle grade 1 anterolisthesis of L5 on S1 unchanged. IMPRESSION: No acute findings in the abdomen/pelvis. Moderate colonic diverticulosis without active inflammation. Stable hepatic cysts.  Stable right renal cyst. Mild cardiomegaly. Atherosclerotic coronary artery disease. Aortic atherosclerosis. Stable appearance of the pancreas with prominence of the main pancreatic duct and several scattered small cystic lesions. Small left inguinal hernia containing only peritoneal fat. Electronically Signed   By: Marin Olp M.D.   On: 08/21/2016 17:15      Impression / Plan:   Jill Bowers is a 84 y.o. y/o female with multiple medical problems, colon cancer s/p hemi-colectomy, not cured presents with BRBPR. Concern is recurrence of cancer or other possibilities include diverticular or hemorrhoidal  - Plan for colonoscopy tomorrow - CLD today - Bowel prep, NPO past idnight  Thank you for involving me in the care of this patient.      LOS: 0 days   Sherri Sear, MD  08/22/2016, 12:22 PM

## 2016-08-23 ENCOUNTER — Observation Stay: Payer: Medicare Other | Admitting: Anesthesiology

## 2016-08-23 ENCOUNTER — Encounter: Payer: Self-pay | Admitting: Anesthesiology

## 2016-08-23 ENCOUNTER — Encounter
Admission: EM | Disposition: A | Discharge status: Home / Self Care | Payer: Self-pay | Source: Home / Self Care | Attending: Emergency Medicine

## 2016-08-23 DIAGNOSIS — K5790 Diverticulosis of intestine, part unspecified, without perforation or abscess without bleeding: Secondary | ICD-10-CM | POA: Diagnosis not present

## 2016-08-23 DIAGNOSIS — Z539 Procedure and treatment not carried out, unspecified reason: Secondary | ICD-10-CM | POA: Diagnosis not present

## 2016-08-23 DIAGNOSIS — I10 Essential (primary) hypertension: Secondary | ICD-10-CM | POA: Diagnosis not present

## 2016-08-23 DIAGNOSIS — K625 Hemorrhage of anus and rectum: Secondary | ICD-10-CM

## 2016-08-23 DIAGNOSIS — C189 Malignant neoplasm of colon, unspecified: Secondary | ICD-10-CM | POA: Diagnosis not present

## 2016-08-23 HISTORY — PX: COLONOSCOPY WITH PROPOFOL: SHX5780

## 2016-08-23 LAB — GLUCOSE, CAPILLARY
GLUCOSE-CAPILLARY: 84 mg/dL (ref 65–99)
Glucose-Capillary: 113 mg/dL — ABNORMAL HIGH (ref 65–99)

## 2016-08-23 SURGERY — COLONOSCOPY WITH PROPOFOL
Anesthesia: General

## 2016-08-23 MED ORDER — SODIUM CHLORIDE 0.9 % IV SOLN
INTRAVENOUS | Status: DC
  Administered 2016-08-24: 05:00:00 20 mL/h via INTRAVENOUS

## 2016-08-23 MED ORDER — PEG 3350-KCL-NA BICARB-NACL 420 G PO SOLR
4000.0000 mL | Freq: Once | ORAL | Status: AC
  Administered 2016-08-23: 4000 mL via ORAL
  Filled 2016-08-23: qty 4000

## 2016-08-23 MED ORDER — PROPOFOL 10 MG/ML IV BOLUS
INTRAVENOUS | Status: AC
  Filled 2016-08-23: qty 20

## 2016-08-23 MED ORDER — PROPOFOL 10 MG/ML IV BOLUS
INTRAVENOUS | Status: DC | PRN
  Administered 2016-08-23: 60 mg via INTRAVENOUS

## 2016-08-23 MED ORDER — LIDOCAINE HCL (PF) 2 % IJ SOLN
INTRAMUSCULAR | Status: AC
  Filled 2016-08-23: qty 2

## 2016-08-23 MED ORDER — PROPOFOL 500 MG/50ML IV EMUL
INTRAVENOUS | Status: DC | PRN
  Administered 2016-08-23: 100 ug/kg/min via INTRAVENOUS

## 2016-08-23 NOTE — Transfer of Care (Signed)
Immediate Anesthesia Transfer of Care Note  Patient: Jill Bowers  Procedure(s) Performed: Procedure(s): COLONOSCOPY WITH PROPOFOL (N/A)  Patient Location: PACU and Endoscopy Unit  Anesthesia Type:General  Level of Consciousness: patient cooperative and lethargic  Airway & Oxygen Therapy: Patient Spontanous Breathing and Patient connected to nasal cannula oxygen  Post-op Assessment: Report given to RN and Post -op Vital signs reviewed and stable  Post vital signs: Reviewed and stable  Last Vitals:  Vitals:   08/23/16 0842 08/23/16 0955  BP: (!) 123/39 130/77  Pulse: 73 71  Resp: 16 17  Temp: 37 C     Last Pain:  Vitals:   08/23/16 0842  TempSrc: Tympanic  PainSc:          Complications: No apparent anesthesia complications

## 2016-08-23 NOTE — Anesthesia Preprocedure Evaluation (Signed)
Anesthesia Evaluation  Patient identified by MRN, date of birth, ID band Patient awake    Reviewed: Allergy & Precautions, H&P , NPO status , Patient's Chart, lab work & pertinent test results  History of Anesthesia Complications Negative for: history of anesthetic complications  Airway Mallampati: III  TM Distance: >3 FB Neck ROM: limited    Dental  (+) Poor Dentition, Missing, Edentulous Upper, Edentulous Lower   Pulmonary neg pulmonary ROS, neg shortness of breath,    Pulmonary exam normal breath sounds clear to auscultation       Cardiovascular Exercise Tolerance: Good hypertension, (-) angina+ CAD, + Past MI, + Peripheral Vascular Disease and +CHF  Normal cardiovascular exam Rhythm:regular Rate:Normal     Neuro/Psych  Headaches, negative psych ROS   GI/Hepatic Neg liver ROS, GERD  Controlled,  Endo/Other  negative endocrine ROS  Renal/GU Renal disease  negative genitourinary   Musculoskeletal   Abdominal   Peds  Hematology negative hematology ROS (+)   Anesthesia Other Findings Past Medical History: No date: Anemia No date: Blood clot associated with vein wall inflammat* No date: CHF (congestive heart failure) ([facility]) No date: Chicken pox No date: Colon cancer ([facility]) No date: Coronary artery disease No date: DVT (deep venous thrombosis) ([facility]) No date: Frequent headaches No date: Gastrointestinal hemorrhage No date: GERD (gastroesophageal reflux disease) No date: Hyperkalemia No date: Hypertension No date: Malignant neoplasm of stomach ([facility]) No date: PE (pulmonary embolism) No date: Renal disorder No date: Renal insufficiency No date: Vitamin D deficiency  Past Surgical History: No date: ABDOMINAL HYSTERECTOMY No date: COLON SURGERY No date: IVC FILTER PLACEMENT ([facility] HX) No date: JOINT REPLACEMENT Right     Comment: knee  BMI    Body Mass Index:  36.42 kg/m       Reproductive/Obstetrics negative OB ROS                             Anesthesia Physical Anesthesia Plan  ASA: III  Anesthesia Plan: General   Post-op Pain Management:    Induction:   Airway Management Planned:   Additional Equipment:   Intra-op Plan:   Post-operative Plan:   Informed Consent: I have reviewed the patients History and Physical, chart, labs and discussed the procedure including the risks, benefits and alternatives for the proposed anesthesia with the patient or authorized representative who has indicated his/her understanding and acceptance.   Dental Advisory Given  Plan Discussed with: Anesthesiologist, CRNA and Surgeon  Anesthesia Plan Comments:         Anesthesia Quick Evaluation

## 2016-08-23 NOTE — Op Note (Signed)
[facility] Gastroenterology Patient Name: Jill Bowers Procedure Date: 08/23/2016 9:15 AM MRN: [id number] Account #: [id number] Date of Birth: May 12, 1929 Admit Type: Inpatient Age: 83 Room: [facility] ENDO ROOM 4 Gender: Female Note Status: Finalized Procedure:            Colonoscopy Indications:          Rectal bleeding Providers:            Jonathon Bellows MD, MD Referring MD:         Jearld Fenton (Referring MD) Medicines:            Monitored Anesthesia Care Complications:        No immediate complications. Procedure:            Pre-Anesthesia Assessment:                       - Prior to the procedure, a History and Physical was                        performed, and patient medications, allergies and                        sensitivities were reviewed. The patient's tolerance of                        previous anesthesia was reviewed.                       - The risks and benefits of the procedure and the                        sedation options and risks were discussed with the                        patient. All questions were answered and informed                        consent was obtained.                       - ASA Grade Assessment: III - A patient with severe                        systemic disease.                       After obtaining informed consent, the colonoscope was                        passed under direct vision. Throughout the procedure,                        the patient's blood pressure, pulse, and oxygen                        saturations were monitored continuously. The Olympus                        CF-HQ190L Colonoscope (S#. [id number]) was introduced                        through the  anus with the intention of advancing to the                        cecum. The scope was advanced to the sigmoid colon                        before the procedure was aborted. Medications were                        given. The colonoscopy was performed with ease. The                         patient tolerated the procedure well. The quality of                        the bowel preparation was inadequate. The colonoscopy                        was aborted due to poor bowel prep with stool present. Findings:      The perianal and digital rectal examinations were normal.      Solid stool was found in the sigmoid colon, precluding visualization. Impression:           - Preparation of the colon was inadequate.                       - The procedure was aborted due to poor bowel prep with                        stool present.                       - Stool in the sigmoid colon.                       - No specimens collected. Recommendation:       - Return patient to hospital ward for ongoing care.                       - Clear liquid diet for 6 hours.                       - Repeat colonoscopy tomorrow because the bowel                        preparation was poor. Procedure Code(s):    --- Professional ---                       [phone], 84, Colonoscopy, flexible; diagnostic, including                        collection of specimen(s) by brushing or washing, when                        performed (separate procedure) Diagnosis Code(s):    --- Professional ---                       Z53.8, Procedure and treatment not carried out for  other reasons                       K62.5, Hemorrhage of anus and rectum CPT copyright 2016 American [employer]. All rights reserved. The codes documented in this report are preliminary and upon coder review may  be revised to meet current compliance requirements. Jonathon Bellows, MD Jonathon Bellows MD, MD 08/23/2016 9:36:36 AM This report has been signed electronically. Number of Addenda: 0 Note Initiated On: 08/23/2016 9:15 AM Total Procedure Duration: 0 hours 1 minute 32 seconds       [facility]

## 2016-08-23 NOTE — Progress Notes (Signed)
PT Refusal Note  Patient Details Name: Jill Bowers MRN: [id number] DOB: 1929-04-05   Cancelled Treatment:    Reason Eval/Treat Not Completed: Patient declined, no reason specified. Chart reviewed. Attempted to see patient however she refuses at this time. She states that she doesn't feel well. Pt still completing bowel prep for repeated colonoscopy attempt. Will attempt PT evaluation at later date as pt is willing and appropriate.  Lyndel Safe Huprich PT, DPT   Huprich,Jason 08/23/2016, 1:40 PM

## 2016-08-23 NOTE — Progress Notes (Signed)
[facility] [place] NAME: Jill Bowers    MR#:  [id number]  DATE OF BIRTH:  22-Oct-1928  SUBJECTIVE:  CHIEF COMPLAINT:   Chief Complaint  Patient presents with  . Blood In Stools  . Abdominal Pain   The patient has no abdominal pain, nausea, vomiting or diarrhea, no rectal bleeding or melena. REVIEW OF SYSTEMS:  Review of Systems  Constitutional: Positive for malaise/fatigue. Negative for chills and fever.  HENT: Negative for sinus pain and sore throat.   Eyes: Negative for blurred vision and double vision.  Respiratory: Negative for cough and shortness of breath.   Cardiovascular: Negative for chest pain and leg swelling.  Gastrointestinal: Negative for abdominal pain.  Genitourinary: Negative for dysuria and hematuria.  Musculoskeletal: Negative for joint pain.  Neurological: Negative for dizziness, focal weakness, seizures, loss of consciousness and weakness.  Psychiatric/Behavioral: Negative for depression. The patient is not nervous/anxious.     DRUG ALLERGIES:   Allergies  Allergen Reactions  . Reglan [Metoclopramide] Other (See Comments)  . Statins Other (See Comments)  . Torsemide Other (See Comments)   VITALS:  Blood pressure (!) 131/51, pulse 65, temperature 98.4 F (36.9 C), temperature source Oral, resp. rate 18, height 5\' 3"  (1.6 m), weight 205 lb 9.6 oz (93.3 kg), SpO2 100 %. PHYSICAL EXAMINATION:  Physical Exam  Constitutional: She is oriented to person, place, and time and well-developed, well-nourished, and in no distress.  HENT:  Head: Normocephalic.  Mouth/Throat: Oropharynx is clear and moist.  Eyes: Conjunctivae and EOM are normal.  Neck: Normal range of motion. Neck supple. No JVD present. No tracheal deviation present.  Cardiovascular: Normal rate, regular rhythm and normal heart sounds.  Exam reveals no gallop.   No murmur heard. Pulmonary/Chest: Effort normal and breath sounds normal.  Abdominal:  Soft. Bowel sounds are normal. She exhibits no distension. There is no tenderness.  Musculoskeletal: Normal range of motion. She exhibits no edema or tenderness.  Neurological: She is alert and oriented to person, place, and time. No cranial nerve deficit.  Skin: No rash noted. No erythema.  Psychiatric: Affect normal.   LABORATORY PANEL:  Female CBC  Recent Labs Lab 08/22/16 0426  08/22/16 2155  WBC 5.7  --   --   HGB 11.3*  < > 12.3  HCT 32.5*  --   --   PLT 203  --   --   < > = values in this interval not displayed. ------------------------------------------------------------------------------------------------------------------ Chemistries   Recent Labs Lab 08/21/16 1346 08/22/16 0426  NA 137 139  K 3.8 3.6  CL 102 102  CO2 28 29  GLUCOSE 133* 107*  BUN 17 14  CREATININE 1.08* 0.89  CALCIUM 9.0 8.7*  AST 15  --   ALT 8*  --   ALKPHOS 71  --   BILITOT 0.5  --    RADIOLOGY:  No results found. ASSESSMENT AND PLAN:   Jill Bowers  is a 83 y.o. female with a known history of  Congestive heart failure, colon cancer in remission, history of DVT and PE status post IVC filter and not on anticoagulation, CK D, chronic anemia presents to hospital secondary to rectal bleeding.  #1 Rectal bleeding- likely diverticular bleeding On liquid diet and advance as tolerated -Hemoglobin is stable at 12.3. Per Dr. Vicente Males, GI consult, Preparation of the colon was inadequate. colonoscopy tomorrow. - hold aspirin  #2 Colon cancer- stage 3, s/p surgery, in remission, elevated but stable  CEA - no further recommendations per oncology, CT of the abdomen with only diverticulosis.  #3 GERD- protonix  #4 Chronic diastolic CHF 0000000- hold lasix, on aldactone low dose  #5 DVT Prophylaxis- TEDs and SCDs  All the records are reviewed and case discussed with Care Management/Social Worker. Management plans discussed with the patient, her daughter and they are in agreement.  CODE STATUS:  Full Code  TOTAL TIME TAKING CARE OF THIS PATIENT: 26 minutes.   More than 50% of the time was spent in counseling/coordination of care: YES  POSSIBLE D/C IN 1 DAYS, DEPENDING ON CLINICAL CONDITION.   Demetrios Loll M.D on 08/23/2016 at 1:05 PM  Between 7am to 6pm - Pager - [phone]  After 6pm go to [email or web address] - Proofreader  [facility]  Office  [phone]  CC: Primary care physician; Webb Silversmith, NP  Note: This dictation was prepared with Dragon dictation along with smaller phrase technology. Any transcriptional errors that result from this process are unintentional.

## 2016-08-23 NOTE — Anesthesia Postprocedure Evaluation (Signed)
Anesthesia Post Note  Patient: Jill Bowers  Procedure(s) Performed: Procedure(s) (LRB): COLONOSCOPY WITH PROPOFOL (N/A)  Patient location during evaluation: Endoscopy Anesthesia Type: General Level of consciousness: awake and alert Pain management: pain level controlled Vital Signs Assessment: post-procedure vital signs reviewed and stable Respiratory status: spontaneous breathing, nonlabored ventilation, respiratory function stable and patient connected to nasal cannula oxygen Cardiovascular status: blood pressure returned to baseline and stable Postop Assessment: no signs of nausea or vomiting Anesthetic complications: no     Last Vitals:  Vitals:   08/23/16 1026 08/23/16 1043  BP: (!) 111/55 (!) 131/51  Pulse: 65 65  Resp: 18 18  Temp:  36.9 C    Last Pain:  Vitals:   08/23/16 1045  TempSrc:   PainSc: 0-No pain                 Precious Haws Piscitello

## 2016-08-23 NOTE — Progress Notes (Signed)
PT Attempt Note  Patient Details Name: Jill Bowers MRN: [id number] DOB: [birth year]   Cancelled Treatment:    Reason Eval/Treat Not Completed: Patient at procedure or test/unavailable. Chart reviewed. Pt currently out of room for colonoscopy. Will attempt PT evaluation at later date/time as pt is available.  Lyndel Safe Huprich PT, DPT   Huprich,Jason 08/23/2016, 10:06 AM

## 2016-08-23 NOTE — Plan of Care (Signed)
Problem: Nutrition: Goal: Adequate nutrition will be maintained Outcome: Progressing Continue on clear liquid diet. NPO after midnight. No c/o n/v.  Problem: Bowel/Gastric: Goal: Will not experience complications related to bowel motility Outcome: Progressing Bowel prep was inadequate.  Plan for colonoscopy tomorrow. Informed consent signed. GoLyTELY initiated. Pt still have brown liquid stool. No bleeding noted.

## 2016-08-23 NOTE — Progress Notes (Signed)
Post recovery. Alert and oriented. Instructed patient and family that patient was not cleaned out and  Clear liguids for 6 hours then NPO. Went over procedure instructions and pictures. Daughters want to talk to Dr. Vicente Males and Dr. Vicente Males notified and will speak with daughters in the room and they were notified and understand.

## 2016-08-23 NOTE — H&P (Signed)
Jonathon Bellows MD [address], [place], [place] [address] Phone: [phone] Fax : [phone]  Primary Care Physician:  Webb Silversmith, NP Primary Gastroenterologist:  Dr. Jonathon Bellows   Pre-Procedure History & Physical: HPI:  Jill Bowers is a 83 y.o. female is here for an colonoscopy.   Past Medical History:  Diagnosis Date  . Anemia   . Blood clot associated with vein wall inflammation   . CHF (congestive heart failure) ([place])   . Chicken pox   . Colon cancer ([place])   . Coronary artery disease   . DVT (deep venous thrombosis) ([place])   . Frequent headaches   . Gastrointestinal hemorrhage   . GERD (gastroesophageal reflux disease)   . Hyperkalemia   . Hypertension   . Malignant neoplasm of stomach ([place])   . PE (pulmonary embolism)   . Renal disorder   . Renal insufficiency   . Vitamin D deficiency     Past Surgical History:  Procedure Laterality Date  . ABDOMINAL HYSTERECTOMY    . COLON SURGERY    . IVC FILTER PLACEMENT ([facility] HX)    . JOINT REPLACEMENT Right    knee    Prior to Admission medications   Medication Sig Start Date End Date Taking? Authorizing Provider  acetaminophen (TYLENOL) 650 MG CR tablet Take 650 mg by mouth 3 (three) times daily with meals.   Yes Historical Provider, MD  aspirin (ASPIRIN EC) 81 MG EC tablet Take 81 mg by mouth daily. Swallow whole.   Yes Historical Provider, MD  Cholecalciferol (VITAMIN D-3) 1000 UNITS CAPS Take 2 capsules by mouth daily.   Yes Historical Provider, MD  furosemide (LASIX) 40 MG tablet TAKE 1 TABLET (40 MG TOTAL) BY MOUTH 2 (TWO) TIMES DAILY AS NEEDED. Patient taking differently: Take 40 mg by mouth daily.  03/10/16  Yes Minna Merritts, MD  HYDROcodone-acetaminophen (NORCO) 7.5-325 MG tablet Take 1 tablet by mouth every 8 (eight) hours as needed for moderate pain. 08/05/16  Yes Jearld Fenton, NP  Magnesium Oxide 500 MG TABS Take 1 tablet by mouth 2 (two) times daily.   Yes Historical Provider, MD  omeprazole  (PRILOSEC) 20 MG capsule Take 20 mg by mouth daily.   Yes Historical Provider, MD  Polyethylene Glycol 3350 (MIRALAX PO) Take by mouth daily.   Yes Historical Provider, MD  spironolactone (ALDACTONE) 25 MG tablet TAKE 1 TABLET (25 MG TOTAL) BY MOUTH 2 (TWO) TIMES DAILY AS NEEDED. Patient taking differently: Take 25 mg by mouth daily.  08/02/16  Yes Minna Merritts, MD  UNABLE TO FIND Take 1 tablet by mouth 2 (two) times daily. Med Name: Omega Red   Yes Historical Provider, MD  vitamin C (ASCORBIC ACID) 500 MG tablet Take 500 mg by mouth daily.   Yes Historical Provider, MD  gabapentin (NEURONTIN) 100 MG capsule Take one pill twice a day x1 week; if no side effects then take one pill thre times a day Patient not taking: Reported on 08/21/2016 08/16/16   Cammie Sickle, MD    Allergies as of 08/21/2016 - Review Complete 08/21/2016  Allergen Reaction Noted  . Reglan [metoclopramide] Other (See Comments) 05/16/2015  . Statins Other (See Comments) 05/16/2015  . Torsemide Other (See Comments) 05/16/2015    Family History  Problem Relation Age of Onset  . Arthritis Mother   . Hypertension Mother   . Colon cancer Sister   . Heart disease Daughter   . Colon cancer Son   .  Pancreatic cancer Son     Social History   Social History  . Marital status: Widowed    Spouse name: N/A  . Number of children: N/A  . Years of education: N/A   Occupational History  . Not on file.   Social History Main Topics  . Smoking status: Never Smoker  . Smokeless tobacco: Never Used  . Alcohol use No  . Drug use: No  . Sexual activity: No   Other Topics Concern  . Not on file   Social History Narrative   Lives at home with daughter, has a walker    Review of Systems: See HPI, otherwise negative ROS  Physical Exam: BP (!) 123/39   Pulse 73   Temp 98.6 F (37 C) (Tympanic)   Resp 16   Ht 5\' 3"  (1.6 m)   Wt 205 lb 9.6 oz (93.3 kg)   SpO2 97%   BMI 36.42 kg/m  General:   Alert,   pleasant and cooperative in NAD Head:  Normocephalic and atraumatic. Neck:  Supple; no masses or thyromegaly. Lungs:  Clear throughout to auscultation.    Heart:  Regular rate and rhythm. Abdomen:  Soft, nontender and nondistended. Normal bowel sounds, without guarding, and without rebound.   Neurologic:  Alert and  oriented x4;  grossly normal neurologically.  Impression/Plan: Jill Bowers is here for an colonoscopy to be performed for rectal bleeding   Risks, benefits, limitations, and alternatives regarding  colonoscopy have been reviewed with the patient.  Questions have been answered.  All parties agreeable.   Jonathon Bellows, MD  08/23/2016, 9:01 AM

## 2016-08-23 NOTE — Anesthesia Post-op Follow-up Note (Cosign Needed)
Anesthesia QCDR form completed.        

## 2016-08-24 ENCOUNTER — Encounter
Admission: EM | Disposition: A | Discharge status: Home / Self Care | Payer: Self-pay | Source: Home / Self Care | Attending: Emergency Medicine

## 2016-08-24 ENCOUNTER — Encounter: Payer: Self-pay | Admitting: Gastroenterology

## 2016-08-24 ENCOUNTER — Observation Stay: Payer: Medicare Other | Admitting: Anesthesiology

## 2016-08-24 DIAGNOSIS — I2510 Atherosclerotic heart disease of native coronary artery without angina pectoris: Secondary | ICD-10-CM | POA: Diagnosis not present

## 2016-08-24 DIAGNOSIS — I110 Hypertensive heart disease with heart failure: Secondary | ICD-10-CM | POA: Diagnosis not present

## 2016-08-24 DIAGNOSIS — C189 Malignant neoplasm of colon, unspecified: Secondary | ICD-10-CM | POA: Diagnosis not present

## 2016-08-24 DIAGNOSIS — K5730 Diverticulosis of large intestine without perforation or abscess without bleeding: Secondary | ICD-10-CM

## 2016-08-24 DIAGNOSIS — I10 Essential (primary) hypertension: Secondary | ICD-10-CM | POA: Diagnosis not present

## 2016-08-24 DIAGNOSIS — K625 Hemorrhage of anus and rectum: Secondary | ICD-10-CM | POA: Diagnosis not present

## 2016-08-24 DIAGNOSIS — K5790 Diverticulosis of intestine, part unspecified, without perforation or abscess without bleeding: Secondary | ICD-10-CM | POA: Diagnosis not present

## 2016-08-24 DIAGNOSIS — K5731 Diverticulosis of large intestine without perforation or abscess with bleeding: Secondary | ICD-10-CM | POA: Diagnosis not present

## 2016-08-24 DIAGNOSIS — I509 Heart failure, unspecified: Secondary | ICD-10-CM | POA: Diagnosis not present

## 2016-08-24 HISTORY — PX: COLONOSCOPY WITH PROPOFOL: SHX5780

## 2016-08-24 SURGERY — COLONOSCOPY WITH PROPOFOL
Anesthesia: General

## 2016-08-24 MED ORDER — PROPOFOL 10 MG/ML IV BOLUS
INTRAVENOUS | Status: DC | PRN
  Administered 2016-08-24: 40 mg via INTRAVENOUS
  Administered 2016-08-24: 30 mg via INTRAVENOUS

## 2016-08-24 MED ORDER — PROPOFOL 500 MG/50ML IV EMUL
INTRAVENOUS | Status: AC
  Filled 2016-08-24: qty 50

## 2016-08-24 MED ORDER — LIDOCAINE HCL (PF) 1 % IJ SOLN
INTRAMUSCULAR | Status: AC
  Filled 2016-08-24: qty 2

## 2016-08-24 MED ORDER — PROPOFOL 500 MG/50ML IV EMUL
INTRAVENOUS | Status: DC | PRN
  Administered 2016-08-24: 160 ug/kg/min via INTRAVENOUS

## 2016-08-24 MED ORDER — LIDOCAINE 2% (20 MG/ML) 5 ML SYRINGE
INTRAMUSCULAR | Status: DC | PRN
  Administered 2016-08-24: 50 mg via INTRAVENOUS

## 2016-08-24 MED ORDER — LIDOCAINE HCL (PF) 1 % IJ SOLN: 2.0000 mL | Freq: Once | INTRAMUSCULAR | Status: DC

## 2016-08-24 NOTE — Anesthesia Preprocedure Evaluation (Signed)
Anesthesia Evaluation  Patient identified by MRN, date of birth, ID band Patient awake    Reviewed: Allergy & Precautions, H&P , NPO status , Patient's Chart, lab work & pertinent test results, reviewed documented beta blocker date and time   Airway Mallampati: III   Neck ROM: full    Dental  (+) Poor Dentition, Teeth Intact   Pulmonary neg pulmonary ROS,    Pulmonary exam normal        Cardiovascular hypertension, + CAD, + Peripheral Vascular Disease and +CHF  negative cardio ROS Normal cardiovascular exam Rhythm:regular Rate:Normal     Neuro/Psych  Headaches, negative neurological ROS  negative psych ROS   GI/Hepatic negative GI ROS, Neg liver ROS, GERD  Medicated,  Endo/Other  negative endocrine ROS  Renal/GU Renal diseasenegative Renal ROS  negative genitourinary   Musculoskeletal   Abdominal   Peds  Hematology negative hematology ROS (+) anemia ,   Anesthesia Other Findings Past Medical History: No date: Anemia No date: Blood clot associated with vein wall inflammat* No date: CHF (congestive heart failure) (H[facility]) No date: Chicken pox No date: Colon cancer (HCC) No date: Coronary artery disease No date: DVT (deep venous thrombosis) ([facility]) No date: Frequent headaches No date: Gastrointestinal hemorrhage No date: GERD (gastroesophageal reflux disease) No date: Hyperkalemia No date: Hypertension No date: Malignant neoplasm of stomach (H[facility]) No date: PE (pulmonary embolism) No date: Renal disorder No date: Renal insufficiency No date: Vitamin D deficiency Past Surgical History: No date: ABDOMINAL HYSTERECTOMY No date: COLON SURGERY 08/23/2016: COLONOSCOPY WITH PROPOFOL N/A     Comment: Procedure: COLONOSCOPY WITH PROPOFOL;                Surgeon: Jonathon Bellows, MD;  Location: [facility]               ENDOSCOPY;  Service: Gastroenterology;                Laterality: N/A; No date: IVC FILTER PLACEMENT ([facility] HX) No  date: JOINT REPLACEMENT Right     Comment: knee BMI    Body Mass Index:  36.42 kg/m     Reproductive/Obstetrics negative OB ROS                             Anesthesia Physical Anesthesia Plan  ASA: IV  Anesthesia Plan: General   Post-op Pain Management:    Induction:   Airway Management Planned:   Additional Equipment:   Intra-op Plan:   Post-operative Plan:   Informed Consent: I have reviewed the patients History and Physical, chart, labs and discussed the procedure including the risks, benefits and alternatives for the proposed anesthesia with the patient or authorized representative who has indicated his/her understanding and acceptance.   Dental Advisory Given  Plan Discussed with: CRNA  Anesthesia Plan Comments:         Anesthesia Quick Evaluation

## 2016-08-24 NOTE — Progress Notes (Signed)
Pt currently out of room for colonoscopy. Not available for evaluation. Will re-attempt, time permitting.  Greggory Stallion, PT, DPT [phone]

## 2016-08-24 NOTE — Progress Notes (Signed)
Colonoscopy - no active bleeding, numerous large diverticuli. Likely diverticular bleed \  I will sign off.  Please call me if any further GI concerns or questions.  We would like to thank you for the opportunity to participate in the care of Jill Bowers.   Dr Jonathon Bellows  Gastroenterology/Hepatology Pager: [phone]

## 2016-08-24 NOTE — Transfer of Care (Signed)
Immediate Anesthesia Transfer of Care Note  Patient: Jill Bowers  Procedure(s) Performed: Procedure(s): COLONOSCOPY WITH PROPOFOL (N/A)  Patient Location: Endoscopy Unit  Anesthesia Type:General  Level of Consciousness: awake  Airway & Oxygen Therapy: Patient connected to nasal cannula oxygen  Post-op Assessment: Post -op Vital signs reviewed and stable  Post vital signs: stable  Last Vitals:  Vitals:   08/24/16 0833 08/24/16 1012  BP: (!) 146/72 (!) 106/50  Pulse: 92 86  Resp: 14 (!) 24  Temp: 36.7 C 36.1 C    Last Pain:  Vitals:   08/24/16 1012  TempSrc: Tympanic  PainSc:          Complications: No apparent anesthesia complications

## 2016-08-24 NOTE — OR Nursing (Signed)
Iv in RT arm slightly swollen Removed .there was no connecter when I attempted to remove iv and make into heparin lock. Lance Sell RN

## 2016-08-24 NOTE — Discharge Summary (Signed)
[place] [facility] [place] NAME: Jill Bowers    MR#:  [id number]  DATE OF BIRTH:  01-18-1929  DATE OF ADMISSION:  08/21/2016   ADMITTING PHYSICIAN: Jill Lighter, MD  DATE OF DISCHARGE:  08/24/2016 PRIMARY CARE PHYSICIAN: Jill Silversmith, NP   ADMISSION DIAGNOSIS:  Rectal bleeding [K62.5] Diverticulosis of large intestine with hemorrhage [K57.31] DISCHARGE DIAGNOSIS:  Active Problems:   Rectal bleeding   Diverticulosis of large intestine with hemorrhage  SECONDARY DIAGNOSIS:   Past Medical History:  Diagnosis Date  . Anemia   . Blood clot associated with vein wall inflammation   . CHF (congestive heart failure) ([place])   . Chicken pox   . Colon cancer ([place])   . Coronary artery disease   . DVT (deep venous thrombosis) ([place])   . Frequent headaches   . Gastrointestinal hemorrhage   . GERD (gastroesophageal reflux disease)   . Hyperkalemia   . Hypertension   . Malignant neoplasm of stomach ([place])   . PE (pulmonary embolism)   . Renal disorder   . Renal insufficiency   . Vitamin D deficiency    HOSPITAL COURSE:   Jill Huskeyis a 83 y.o. femalewith a known history of Congestive heart failure, colon cancer in remission, history of DVT and PE status post IVC filter and not on anticoagulation, CK D, chronic anemia presents to hospital secondary to rectal bleeding.  #1 Rectal bleeding due to diverticular bleeding -Hemoglobin is stable at 12.3. Per Dr. Vicente Bowers, GI consult, Preparation of the colon was inadequate. colonoscopy today: no active bleeding, numerous large diverticuli. Likely diverticular bleed. - hold aspirin  #2 Colon cancer- stage 3, s/p surgery, in remission, elevated but stable CEA - no further recommendations per oncology, CT of the abdomen with only diverticulosis.  #3 GERD- protonix  #4 Chronic diastolic CHF 0000000- hold lasix, on aldactone low dose, resume lasix after discharge.  DISCHARGE CONDITIONS:    Stable, discharge to home today. CONSULTS OBTAINED:  Treatment Team:  Jill Landsman, MD DRUG ALLERGIES:   Allergies  Allergen Reactions  . Reglan [Metoclopramide] Other (See Comments)  . Statins Other (See Comments)  . Torsemide Other (See Comments)   DISCHARGE MEDICATIONS:   Allergies as of 08/24/2016      Reactions   Reglan [metoclopramide] Other (See Comments)   Statins Other (See Comments)   Torsemide Other (See Comments)      Medication List    STOP taking these medications   aspirin EC 81 MG EC tablet Generic drug:  aspirin   UNABLE TO FIND     TAKE these medications   acetaminophen 650 MG CR tablet Commonly known as:  TYLENOL Take 650 mg by mouth 3 (three) times daily with meals.   furosemide 40 MG tablet Commonly known as:  LASIX TAKE 1 TABLET (40 MG TOTAL) BY MOUTH 2 (TWO) TIMES DAILY AS NEEDED. What changed:  when to take this   gabapentin 100 MG capsule Commonly known as:  NEURONTIN Take one pill twice a day x1 week; if no side effects then take one pill thre times a day   HYDROcodone-acetaminophen 7.5-325 MG tablet Commonly known as:  NORCO Take 1 tablet by mouth every 8 (eight) hours as needed for moderate pain.   Magnesium Oxide 500 MG Tabs Take 1 tablet by mouth 2 (two) times daily.   MIRALAX PO Take by mouth daily.   omeprazole 20 MG capsule Commonly known as:  PRILOSEC Take 20 mg by  mouth daily.   spironolactone 25 MG tablet Commonly known as:  ALDACTONE TAKE 1 TABLET (25 MG TOTAL) BY MOUTH 2 (TWO) TIMES DAILY AS NEEDED. What changed:  when to take this   vitamin C 500 MG tablet Commonly known as:  ASCORBIC ACID Take 500 mg by mouth daily.   Vitamin D-3 1000 units Caps Take 2 capsules by mouth daily.        DISCHARGE INSTRUCTIONS:  See AVS. If you experience worsening of your admission symptoms, develop shortness of breath, life threatening emergency, suicidal or homicidal thoughts you must seek medical attention  immediately by calling 911 or calling your MD immediately  if symptoms less severe.  You Must read complete instructions/literature along with all the possible adverse reactions/side effects for all the Medicines you take and that have been prescribed to you. Take any new Medicines after you have completely understood and accpet all the possible adverse reactions/side effects.   Please note  You were cared for by a hospitalist during your hospital stay. If you have any questions about your discharge medications or the care you received while you were in the hospital after you are discharged, you can call the unit and asked to speak with the hospitalist on call if the hospitalist that took care of you is not available. Once you are discharged, your primary care physician will handle any further medical issues. Please note that NO REFILLS for any discharge medications will be authorized once you are discharged, as it is imperative that you return to your primary care physician (or establish a relationship with a primary care physician if you do not have one) for your aftercare needs so that they can reassess your need for medications and monitor your lab values.    On the day of Discharge:  VITAL SIGNS:  Blood pressure (!) 139/40, pulse 66, temperature 98.8 F (37.1 C), temperature source Oral, resp. rate 17, height 5\' 3"  (1.6 m), weight 205 lb 9.6 oz (93.3 kg), SpO2 95 %. PHYSICAL EXAMINATION:  GENERAL:  83 y.o.-year-old patient lying in the bed with no acute distress.  EYES: Pupils equal, round, reactive to light and accommodation. No scleral icterus. Extraocular muscles intact.  HEENT: Head atraumatic, normocephalic. Oropharynx and nasopharynx clear.  NECK:  Supple, no jugular venous distention. No thyroid enlargement, no tenderness.  LUNGS: Normal breath sounds bilaterally, no wheezing, rales,rhonchi or crepitation. No use of accessory muscles of respiration.  CARDIOVASCULAR: S1, S2 normal. No  murmurs, rubs, or gallops.  ABDOMEN: Soft, non-tender, non-distended. Bowel sounds present. No organomegaly or mass.  EXTREMITIES: No pedal edema, cyanosis, or clubbing.  NEUROLOGIC: Cranial nerves II through XII are intact. Muscle strength 5/5 in all extremities. Sensation intact. Gait not checked.  PSYCHIATRIC: The patient is alert and oriented x 3.  SKIN: No obvious rash, lesion, or ulcer.  DATA REVIEW:   CBC  Recent Labs Lab 08/22/16 0426  08/22/16 2155  WBC 5.7  --   --   HGB 11.3*  < > 12.3  HCT 32.5*  --   --   PLT 203  --   --   < > = values in this interval not displayed.  Chemistries   Recent Labs Lab 08/21/16 1346 08/22/16 0426  NA 137 139  K 3.8 3.6  CL 102 102  CO2 28 29  GLUCOSE 133* 107*  BUN 17 14  CREATININE 1.08* 0.89  CALCIUM 9.0 8.7*  AST 15  --   ALT 8*  --  ALKPHOS 42  --   BILITOT 0.5  --      Microbiology Results  Results for orders placed or performed during the hospital encounter of 07/02/16  Rapid Influenza A&B Antigens ([place] only)     Status: None   Collection Time: 07/02/16 10:31 AM  Result Value Ref Range Status   Influenza A ([facility]) NEGATIVE NEGATIVE Final   Influenza B ([facility]) NEGATIVE NEGATIVE Final    RADIOLOGY:  No results found.   Management plans discussed with the patient, family and they are in agreement.  CODE STATUS: Full Code   TOTAL TIME TAKING CARE OF THIS PATIENT: 22 minutes.    Demetrios Loll M.D on 08/24/2016 at 1:26 PM  Between 7am to 6pm - Pager - [phone]  After 6pm go to [email or web address] - Proofreader  [facility]  Office  [phone]  CC: Primary care physician; Jill Silversmith, NP   Note: This dictation was prepared with Dragon dictation along with smaller phrase technology. Any transcriptional errors that result from this process are unintentional.

## 2016-08-24 NOTE — H&P (Signed)
Jonathon Bellows MD [address] [place], [place] [address] Phone: [phone] Fax : [phone]  Primary Care Physician:  Webb Silversmith, NP Primary Gastroenterologist:  Dr. Jonathon Bellows   Pre-Procedure History & Physical: HPI:  Jill Bowers is a 83 y.o. female is here for an colonoscopy.   Past Medical History:  Diagnosis Date  . Anemia   . Blood clot associated with vein wall inflammation   . CHF (congestive heart failure) ([place])   . Chicken pox   . Colon cancer ([place])   . Coronary artery disease   . DVT (deep venous thrombosis) ([place])   . Frequent headaches   . Gastrointestinal hemorrhage   . GERD (gastroesophageal reflux disease)   . Hyperkalemia   . Hypertension   . Malignant neoplasm of stomach ([place])   . PE (pulmonary embolism)   . Renal disorder   . Renal insufficiency   . Vitamin D deficiency     Past Surgical History:  Procedure Laterality Date  . ABDOMINAL HYSTERECTOMY    . COLON SURGERY    . COLONOSCOPY WITH PROPOFOL N/A 08/23/2016   Procedure: COLONOSCOPY WITH PROPOFOL;  Surgeon: Jonathon Bellows, MD;  Location: [facility] ENDOSCOPY;  Service: Gastroenterology;  Laterality: N/A;  . IVC FILTER PLACEMENT ([place] HX)    . JOINT REPLACEMENT Right    knee    Prior to Admission medications   Medication Sig Start Date End Date Taking? Authorizing Provider  acetaminophen (TYLENOL) 650 MG CR tablet Take 650 mg by mouth 3 (three) times daily with meals.   Yes Historical Provider, MD  aspirin (ASPIRIN EC) 81 MG EC tablet Take 81 mg by mouth daily. Swallow whole.   Yes Historical Provider, MD  Cholecalciferol (VITAMIN D-3) 1000 UNITS CAPS Take 2 capsules by mouth daily.   Yes Historical Provider, MD  furosemide (LASIX) 40 MG tablet TAKE 1 TABLET (40 MG TOTAL) BY MOUTH 2 (TWO) TIMES DAILY AS NEEDED. Patient taking differently: Take 40 mg by mouth daily.  03/10/16  Yes Minna Merritts, MD  HYDROcodone-acetaminophen (NORCO) 7.5-325 MG tablet Take 1 tablet by mouth every 8 (eight)  hours as needed for moderate pain. 08/05/16  Yes Jearld Fenton, NP  Magnesium Oxide 500 MG TABS Take 1 tablet by mouth 2 (two) times daily.   Yes Historical Provider, MD  omeprazole (PRILOSEC) 20 MG capsule Take 20 mg by mouth daily.   Yes Historical Provider, MD  Polyethylene Glycol 3350 (MIRALAX PO) Take by mouth daily.   Yes Historical Provider, MD  spironolactone (ALDACTONE) 25 MG tablet TAKE 1 TABLET (25 MG TOTAL) BY MOUTH 2 (TWO) TIMES DAILY AS NEEDED. Patient taking differently: Take 25 mg by mouth daily.  08/02/16  Yes Minna Merritts, MD  UNABLE TO FIND Take 1 tablet by mouth 2 (two) times daily. Med Name: Omega Red   Yes Historical Provider, MD  vitamin C (ASCORBIC ACID) 500 MG tablet Take 500 mg by mouth daily.   Yes Historical Provider, MD  gabapentin (NEURONTIN) 100 MG capsule Take one pill twice a day x1 week; if no side effects then take one pill thre times a day Patient not taking: Reported on 08/21/2016 08/16/16   Cammie Sickle, MD    Allergies as of 08/21/2016 - Review Complete 08/21/2016  Allergen Reaction Noted  . Reglan [metoclopramide] Other (See Comments) 05/16/2015  . Statins Other (See Comments) 05/16/2015  . Torsemide Other (See Comments) 05/16/2015    Family History  Problem Relation Age of Onset  .  Arthritis Mother   . Hypertension Mother   . Colon cancer Sister   . Heart disease Daughter   . Colon cancer Son   . Pancreatic cancer Son     Social History   Social History  . Marital status: Widowed    Spouse name: N/A  . Number of children: N/A  . Years of education: N/A   Occupational History  . Not on file.   Social History Main Topics  . Smoking status: Never Smoker  . Smokeless tobacco: Never Used  . Alcohol use No  . Drug use: No  . Sexual activity: No   Other Topics Concern  . Not on file   Social History Narrative   Lives at home with daughter, has a walker    Review of Systems: See HPI, otherwise negative ROS  Physical  Exam: BP (!) 146/72   Pulse 92   Temp 98 F (36.7 C) (Tympanic)   Resp 14   Ht 5\' 3"  (1.6 m)   Wt 205 lb 9.6 oz (93.3 kg)   SpO2 99%   BMI 36.42 kg/m  General:   Alert,  pleasant and cooperative in NAD Head:  Normocephalic and atraumatic. Neck:  Supple; no masses or thyromegaly. Lungs:  Clear throughout to auscultation.    Heart:  Regular rate and rhythm. Abdomen:  Soft, nontender and nondistended. Normal bowel sounds, without guarding, and without rebound.   Neurologic:  Alert and  oriented x4;  grossly normal neurologically.  Impression/Plan: Jill Bowers is here for an colonoscopy to be performed for bright red blood per rectum   Risks, benefits, limitations, and alternatives regarding  colonoscopy have been reviewed with the patient.  Questions have been answered.  All parties agreeable.   Jonathon Bellows, MD  08/24/2016, 9:20 AM

## 2016-08-24 NOTE — Evaluation (Signed)
Physical Therapy Evaluation Patient Details Name: Jill Bowers MRN: [id number] DOB: October 07, 1928 Today's Date: 08/24/2016   History of Present Illness  Pt admitted for rectal bleeding and diverticulosis. Pt had colonoscopy this date. PMH includes anemia, CHF, GERD, and HTN. Pt with planned dc this date.  Clinical Impression  Pt is a pleasant 83 year old female who was admitted for rectal bleeding and deverticulosis. Pt performs bed mobility, transfers, and ambulation with min assist and RW. Pt fatigues quickly with all exertion. Pt observed performing transfer from [facility] to bed upon arrival. Pt is very high fall risk. Pt demonstrates deficits with strength/mobility/endurance. Pt hoping to cancel current dc order and re-evaluate next date to assess if pt can go home. Pt is not at baseline at this time. Would benefit from skilled PT to address above deficits and promote optimal return to PLOF; recommend transition to STR upon discharge from acute hospitalization.       Follow Up Recommendations SNF    Equipment Recommendations  3in1 (PT)    Recommendations for Other Services       Precautions / Restrictions Precautions Precautions: Fall Restrictions Weight Bearing Restrictions: No      Mobility  Bed Mobility Overal bed mobility: Needs Assistance Bed Mobility: Sit to Supine       Sit to supine: Min assist   General bed mobility comments: assist for bringing B LEs up onto bed as well as scooting towards HOB. Pt fatigues quickly with exertion.  Transfers Overall transfer level: Needs assistance Equipment used: Rolling walker (2 wheeled) Transfers: Sit to/from Stand Sit to Stand: Min assist         General transfer comment: Effortful during assist. Forward flexed posture noted. Heavy reliance on RW  Ambulation/Gait Ambulation/Gait assistance: Min assist Ambulation Distance (Feet): 3 Feet Assistive device: Rolling walker (2 wheeled) Gait Pattern/deviations: Step-to  pattern;Decreased step length - left;Decreased step length - right     General Gait Details: Pt ambulated with very effortful movements during stepping. Not safe to ambulate further than 3' secondary to fatigue.   Stairs            Wheelchair Mobility    Modified Rankin (Stroke Patients Only)       Balance Overall balance assessment: Needs assistance Sitting-balance support: Feet supported Sitting balance-Leahy Scale: Good     Standing balance support: Bilateral upper extremity supported Standing balance-Leahy Scale: Fair                               Pertinent Vitals/Pain Pain Assessment: Faces Faces Pain Scale: Hurts little more Pain Location: reports "soreness" in B LEs. Pain Descriptors / Indicators: Dull;Discomfort Pain Intervention(s): Limited activity within patient's tolerance    Home Living Family/patient expects to be discharged to:: Private residence Living Arrangements: Children Available Help at Discharge: Family;Available 24 hours/day Type of Home: House Home Access:  (has electric "elevator" lift to enter house)     Home Layout: One level Home Equipment: [place] - 4 wheels;Wheelchair - manual      Prior Function Level of Independence: Independent with assistive device(s)         Comments: uses rollater, minimal ambulation and pt sits in rollater when she gets tired     Hand Dominance        Extremity/Trunk Assessment   Upper Extremity Assessment Upper Extremity Assessment: Generalized weakness (B UE grossly 4/5)    Lower Extremity Assessment Lower Extremity Assessment: Generalized weakness (  B LE grossly 3+/5)       Communication   Communication: No difficulties  Cognition Arousal/Alertness: Awake/alert Behavior During Therapy: WFL for tasks assessed/performed Overall Cognitive Status: Within Functional Limits for tasks assessed                      General Comments      Exercises      Assessment/Plan    PT Assessment Patient needs continued PT services  PT Problem List Decreased strength;Decreased activity tolerance;Decreased balance;Decreased mobility       PT Treatment Interventions Gait training;DME instruction;Therapeutic activities;Therapeutic exercise;Balance training    PT Goals (Current goals can be found in the Care Plan section)  Acute Rehab PT Goals Patient Stated Goal: to get stronger PT Goal Formulation: With patient Time For Goal Achievement: 09/07/16 Potential to Achieve Goals: Good    Frequency Min 2X/week   Barriers to discharge        Co-evaluation               End of Session Equipment Utilized During Treatment: Gait belt Activity Tolerance: Patient limited by fatigue Patient left: in bed;with bed alarm set;with family/visitor present Nurse Communication: Mobility status PT Visit Diagnosis: Unsteadiness on feet (R26.81);Muscle weakness (generalized) (M62.81)    Functional Assessment Tool Used: AM-PAC 6 Clicks Basic Mobility Functional Limitation: Mobility: Walking and moving around Mobility: Walking and Moving Around Current Status [id number]): At least 40 percent but less than 60 percent impaired, limited or restricted Mobility: Walking and Moving Around Goal Status [phone]): At least 20 percent but less than 40 percent impaired, limited or restricted    Time: 1330-1352 PT Time Calculation (min) (ACUTE ONLY): 22 min   Charges:   PT Evaluation $PT Eval Moderate Complexity: 1 Procedure     PT G Codes:   PT G-Codes **NOT FOR INPATIENT CLASS** Functional Assessment Tool Used: AM-PAC 6 Clicks Basic Mobility Functional Limitation: Mobility: Walking and moving around Mobility: Walking and Moving Around Current Status [id number]): At least 40 percent but less than 60 percent impaired, limited or restricted Mobility: Walking and Moving Around Goal Status [phone]): At least 20 percent but less than 40 percent impaired, limited or  restricted     Zeek Rostron 08/24/2016, 2:41 PM Greggory Stallion, PT, DPT [phone]

## 2016-08-24 NOTE — Op Note (Signed)
[facility] Gastroenterology Patient Name: Jill Bowers Procedure Date: 08/24/2016 9:21 AM MRN: [id number] Account #: [id number] Date of Birth: May 09, 1929 Admit Type: Inpatient Age: 83 Room: [facility] ENDO ROOM 1 Gender: Female Note Status: Finalized Procedure:            Colonoscopy Indications:          Rectal bleeding Providers:            Jonathon Bellows MD, MD Medicines:            Monitored Anesthesia Care Complications:        No immediate complications. Procedure:            Pre-Anesthesia Assessment:                       - Prior to the procedure, a History and Physical was                        performed, and patient medications, allergies and                        sensitivities were reviewed. The patient's tolerance of                        previous anesthesia was reviewed.                       - After reviewing the risks and benefits, the patient                        was deemed in satisfactory condition to undergo the                        procedure.                       - ASA Grade Assessment: III - A patient with severe                        systemic disease.                       After obtaining informed consent, the colonoscope was                        passed under direct vision. Throughout the procedure,                        the patient's blood pressure, pulse, and oxygen                        saturations were monitored continuously. The Olympus                        CF-H180AL colonoscope ( S#: [id number] ) was introduced                        through the anus and advanced to the the ileocolonic                        anastomosis. The colonoscopy was performed with  moderate difficulty due to a tortuous colon. Successful                        completion of the procedure was aided by applying                        abdominal pressure. The patient tolerated the procedure                        well. The quality of the bowel  preparation was adequate. Findings:      The perianal and digital rectal examinations were normal.      Multiple small and large-mouthed diverticula were found in the sigmoid       colon and descending colon.      Non-bleeding internal hemorrhoids were found during endoscopy. The       hemorrhoids were medium-sized and Grade I (internal hemorrhoids that do       not prolapse).      The exam was otherwise without abnormality. Impression:           - Diverticulosis in the sigmoid colon and in the                        descending colon.                       - Non-bleeding internal hemorrhoids.                       - The examination was otherwise normal.                       - No specimens collected. Recommendation:       - Return patient to hospital ward for ongoing care.                       - Resume previous diet.                       - Continue present medications.                       - Rectal bleeding likely diverticular Procedure Code(s):    --- Professional ---                       [phone], Colonoscopy, flexible; diagnostic, including                        collection of specimen(s) by brushing or washing, when                        performed (separate procedure) Diagnosis Code(s):    --- Professional ---                       K64.0, First degree hemorrhoids                       K62.5, Hemorrhage of anus and rectum                       K57.30, Diverticulosis of large intestine without  perforation or abscess without bleeding CPT copyright 2016 American Medical Association. All rights reserved. The codes documented in this report are preliminary and upon coder review may  be revised to meet current compliance requirements. Jonathon Bellows, MD Jonathon Bellows MD, MD 08/24/2016 10:08:10 AM This report has been signed electronically. Number of Addenda: 0 Note Initiated On: 08/24/2016 9:21 AM Scope Withdrawal Time: 0 hours 14 minutes 23 seconds  Total Procedure  Duration: 0 hours 32 minutes 28 seconds       [facility]

## 2016-08-24 NOTE — Anesthesia Postprocedure Evaluation (Signed)
Anesthesia Post Note  Patient: Jill Bowers  Procedure(s) Performed: Procedure(s) (LRB): COLONOSCOPY WITH PROPOFOL (N/A)  Patient location during evaluation: PACU Anesthesia Type: General Level of consciousness: awake and alert Pain management: pain level controlled Vital Signs Assessment: post-procedure vital signs reviewed and stable Respiratory status: spontaneous breathing, nonlabored ventilation, respiratory function stable and patient connected to nasal cannula oxygen Cardiovascular status: blood pressure returned to baseline and stable Postop Assessment: no signs of nausea or vomiting Anesthetic complications: no     Last Vitals:  Vitals:   08/24/16 0833 08/24/16 1012  BP: (!) 146/72 (!) 106/50  Pulse: 92 86  Resp: 14 (!) 24  Temp: 36.7 C 36.1 C    Last Pain:  Vitals:   08/24/16 1012  TempSrc: Tympanic  PainSc:                  Molli Barrows

## 2016-08-24 NOTE — Anesthesia Post-op Follow-up Note (Cosign Needed)
Anesthesia QCDR form completed.        

## 2016-08-25 ENCOUNTER — Encounter: Payer: Self-pay | Admitting: Gastroenterology

## 2016-08-25 DIAGNOSIS — K625 Hemorrhage of anus and rectum: Secondary | ICD-10-CM | POA: Diagnosis not present

## 2016-08-25 DIAGNOSIS — R531 Weakness: Secondary | ICD-10-CM | POA: Diagnosis not present

## 2016-08-25 DIAGNOSIS — C189 Malignant neoplasm of colon, unspecified: Secondary | ICD-10-CM | POA: Diagnosis not present

## 2016-08-25 DIAGNOSIS — K5790 Diverticulosis of intestine, part unspecified, without perforation or abscess without bleeding: Secondary | ICD-10-CM | POA: Diagnosis not present

## 2016-08-25 NOTE — Care Management (Signed)
Physical therapy evaluation completed. Recommending Home with Home Health and Physical therapy. Discussed agencies with daughter, [name] [phone]). [place]. Will update Floydene Flock, [facility] representative.  Discharge to home today per Dr. Haydee Monica RN MSN [place]

## 2016-08-25 NOTE — Progress Notes (Signed)
Physical Therapy Treatment Patient Details Name: Jill Bowers MRN: [id number] DOB: Aug 07, 1928 Today's Date: 08/25/2016    History of Present Illness Pt admitted for rectal bleeding and diverticulosis. Pt had colonoscopy this date. PMH includes anemia, CHF, GERD, and HTN. Pt with planned dc this date.    PT Comments    Pt is making good progress towards goals with improved OOB mobility this date. Pt appears to be at baseline level with ability to ambulate in room. 1 seated rest break required due to fatigue. Good participation in there-ex, limited by R knee pain. Family in room during session. Pt and family optimistic about dc this date. Changing recommendation to HHPT at this time as pt is safe to dc home.   Follow Up Recommendations  Home health PT     Equipment Recommendations  None recommended by PT    Recommendations for Other Services       Precautions / Restrictions Precautions Precautions: Fall Restrictions Weight Bearing Restrictions: No    Mobility  Bed Mobility Overal bed mobility: Needs Assistance Bed Mobility: Supine to Sit     Supine to sit: Supervision     General bed mobility comments: improved technique noted for bed mobility. Pt with slow movement, however able to perform without assistance needed.  Transfers Overall transfer level: Needs assistance Equipment used: Rolling walker (2 wheeled) Transfers: Sit to/from Stand Sit to Stand: Min guard         General transfer comment: flexed posture noted, however improved ease of transfer. Pt reports not dizziness and no LOB noted  Ambulation/Gait Ambulation/Gait assistance: Min guard Ambulation Distance (Feet): 80 Feet Assistive device: Rolling walker (2 wheeled) Gait Pattern/deviations: Step-to pattern;Decreased step length - right;Decreased step length - left     General Gait Details: Ambulation performed in room with 1 seated rest break. Pt demonstrates slow technique, however safe with turning  and correct use of RW.   Stairs            Wheelchair Mobility    Modified Rankin (Stroke Patients Only)       Balance                                    Cognition Arousal/Alertness: Awake/alert Behavior During Therapy: WFL for tasks assessed/performed Overall Cognitive Status: Within Functional Limits for tasks assessed                      Exercises Other Exercises Other Exercises: Pt ambulated to [facility] with supervision. Pt able to perform self hygiene with safe technique Other Exercises: Supine ther-ex performed including B LE ankle pumps, SLRs, hip abd/add as well as B UE shoulder flexion. Further ther-ex deferred secondary to B knee pain. All ther-ex performed x 10 reps with cga/min assist for performance.    General Comments        Pertinent Vitals/Pain Pain Assessment: 0-10 Pain Score: 8  Pain Location: soreness in R LE specifically knee Pain Descriptors / Indicators: Dull;Discomfort Pain Intervention(s): Limited activity within patient's tolerance    Home Living                      Prior Function            PT Goals (current goals can now be found in the care plan section) Acute Rehab PT Goals Patient Stated Goal: to get stronger PT Goal Formulation: With patient  Time For Goal Achievement: 09/07/16 Potential to Achieve Goals: Good Progress towards PT goals: Progressing toward goals    Frequency    Min 2X/week      PT Plan Current plan remains appropriate    Co-evaluation             End of Session Equipment Utilized During Treatment: Gait belt Activity Tolerance: Patient limited by pain;Patient limited by fatigue Patient left: in bed;with bed alarm set;with family/visitor present Nurse Communication: Mobility status PT Visit Diagnosis: Unsteadiness on feet (R26.81);Muscle weakness (generalized) (M62.81)     Time: [id number] PT Time Calculation (min) (ACUTE ONLY): 23 min  Charges:  $Gait  Training: 8-22 mins $Therapeutic Exercise: 8-22 mins                    G Codes:  Functional Assessment Tool Used: AM-PAC 6 Clicks Basic Mobility Functional Limitation: Mobility: Walking and moving around Mobility: Walking and Moving Around Current Status [id number]): At least 40 percent but less than 60 percent impaired, limited or restricted Mobility: Walking and Moving Around Goal Status [phone]): At least 20 percent but less than 40 percent impaired, limited or restricted    Marria Mathison 08/25/2016, 10:53 AM Greggory Stallion, PT, DPT [phone]

## 2016-08-25 NOTE — Progress Notes (Signed)
Received MD order to discharge to home reviewed home meds and discharge instructions  With patient and family and both verbalized understanding, discahrge to home in wheelchair with nursing staff.

## 2016-08-25 NOTE — Discharge Summary (Signed)
[facility] NAME: Jill Bowers    MR#:  [id number]  DATE OF BIRTH:  12-28-1928  DATE OF ADMISSION:  08/21/2016   ADMITTING PHYSICIAN: Gladstone Lighter, MD  DATE OF DISCHARGE: 08/25/2016 PRIMARY CARE PHYSICIAN: Webb Silversmith, NP   ADMISSION DIAGNOSIS:  Rectal bleeding [K62.5] Diverticulosis of large intestine with hemorrhage [K57.31] DISCHARGE DIAGNOSIS:  Active Problems:   Rectal bleeding   Diverticulosis of large intestine with hemorrhage  SECONDARY DIAGNOSIS:   Past Medical History:  Diagnosis Date  . Anemia   . Blood clot associated with vein wall inflammation   . CHF (congestive heart failure) ([place])   . Chicken pox   . Colon cancer ([place])   . Coronary artery disease   . DVT (deep venous thrombosis) ([place])   . Frequent headaches   . Gastrointestinal hemorrhage   . GERD (gastroesophageal reflux disease)   . Hyperkalemia   . Hypertension   . Malignant neoplasm of stomach ([place])   . PE (pulmonary embolism)   . Renal disorder   . Renal insufficiency   . Vitamin D deficiency    HOSPITAL COURSE:   Jill Huskeyis a 83 y.o. femalewith a known history of Congestive heart failure, colon cancer in remission, history of DVT and PE status post IVC filter and not on anticoagulation, CK D, chronic anemia presents to hospital secondary to rectal bleeding.  #1 Rectal bleeding due to diverticular bleeding -Hemoglobin is stable at 12.3. Per Dr. Jerrilyn Cairo consult, Preparation of the colon was inadequate. colonoscopy today: no active bleeding, numerous large diverticuli. Likely diverticular bleed. - hold aspirin  #2 Colon cancer- stage 3, s/p surgery, in remission, elevated but stable CEA - no further recommendations per oncology, CT of the abdomen with only diverticulosis.  #3 GERD- protonix  #4 Chronic diastolic CHF 0000000- hold lasix, on aldactone low dose, resume lasix after discharge.  Generalized weakness. PT  evaluation suggested the patient need home health and PT. DISCHARGE CONDITIONS:  Stable, discharge to home with HHPT today. CONSULTS OBTAINED:  Treatment Team:  Lin Landsman, MD DRUG ALLERGIES:   Allergies  Allergen Reactions  . Reglan [Metoclopramide] Other (See Comments)  . Statins Other (See Comments)  . Torsemide Other (See Comments)   DISCHARGE MEDICATIONS:   Allergies as of 08/25/2016      Reactions   Reglan [metoclopramide] Other (See Comments)   Statins Other (See Comments)   Torsemide Other (See Comments)      Medication List    STOP taking these medications   aspirin EC 81 MG EC tablet Generic drug:  aspirin   UNABLE TO FIND     TAKE these medications   acetaminophen 650 MG CR tablet Commonly known as:  TYLENOL Take 650 mg by mouth 3 (three) times daily with meals.   furosemide 40 MG tablet Commonly known as:  LASIX TAKE 1 TABLET (40 MG TOTAL) BY MOUTH 2 (TWO) TIMES DAILY AS NEEDED. What changed:  when to take this   gabapentin 100 MG capsule Commonly known as:  NEURONTIN Take one pill twice a day x1 week; if no side effects then take one pill thre times a day   HYDROcodone-acetaminophen 7.5-325 MG tablet Commonly known as:  NORCO Take 1 tablet by mouth every 8 (eight) hours as needed for moderate pain.   Magnesium Oxide 500 MG Tabs Take 1 tablet by mouth 2 (two) times daily.   MIRALAX PO Take by mouth daily.   omeprazole 20  MG capsule Commonly known as:  PRILOSEC Take 20 mg by mouth daily.   spironolactone 25 MG tablet Commonly known as:  ALDACTONE TAKE 1 TABLET (25 MG TOTAL) BY MOUTH 2 (TWO) TIMES DAILY AS NEEDED. What changed:  when to take this   vitamin C 500 MG tablet Commonly known as:  ASCORBIC ACID Take 500 mg by mouth daily.   Vitamin D-3 1000 units Caps Take 2 capsules by mouth daily.        DISCHARGE INSTRUCTIONS:  See AVS.  If you experience worsening of your admission symptoms, develop shortness of breath,  life threatening emergency, suicidal or homicidal thoughts you must seek medical attention immediately by calling 911 or calling your MD immediately  if symptoms less severe.  You Must read complete instructions/literature along with all the possible adverse reactions/side effects for all the Medicines you take and that have been prescribed to you. Take any new Medicines after you have completely understood and accpet all the possible adverse reactions/side effects.   Please note  You were cared for by a hospitalist during your hospital stay. If you have any questions about your discharge medications or the care you received while you were in the hospital after you are discharged, you can call the unit and asked to speak with the hospitalist on call if the hospitalist that took care of you is not available. Once you are discharged, your primary care physician will handle any further medical issues. Please note that NO REFILLS for any discharge medications will be authorized once you are discharged, as it is imperative that you return to your primary care physician (or establish a relationship with a primary care physician if you do not have one) for your aftercare needs so that they can reassess your need for medications and monitor your lab values.    On the day of Discharge:  VITAL SIGNS:  Blood pressure (!) 121/45, pulse 72, temperature 98.6 F (37 C), temperature source Oral, resp. rate 20, height 5\' 3"  (1.6 m), weight 205 lb 9.6 oz (93.3 kg), SpO2 97 %. PHYSICAL EXAMINATION:  GENERAL:  83 y.o.-year-old patient lying in the bed with no acute distress. Obese. EYES: Pupils equal, round, reactive to light and accommodation. No scleral icterus. Extraocular muscles intact.  HEENT: Head atraumatic, normocephalic. Oropharynx and nasopharynx clear.  NECK:  Supple, no jugular venous distention. No thyroid enlargement, no tenderness.  LUNGS: Normal breath sounds bilaterally, no wheezing, rales,rhonchi  or crepitation. No use of accessory muscles of respiration.  CARDIOVASCULAR: S1, S2 normal. No murmurs, rubs, or gallops.  ABDOMEN: Soft, non-tender, non-distended. Bowel sounds present. No organomegaly or mass.  EXTREMITIES: No pedal edema, cyanosis, or clubbing.  NEUROLOGIC: Cranial nerves II through XII are intact. Muscle strength 5/5 in all extremities. Sensation intact. Gait not checked.  PSYCHIATRIC: The patient is alert and oriented x 3.  SKIN: No obvious rash, lesion, or ulcer.  DATA REVIEW:   CBC  Recent Labs Lab 08/22/16 0426  08/22/16 2155  WBC 5.7  --   --   HGB 11.3*  < > 12.3  HCT 32.5*  --   --   PLT 203  --   --   < > = values in this interval not displayed.  Chemistries   Recent Labs Lab 08/21/16 1346 08/22/16 0426  NA 137 139  K 3.8 3.6  CL 102 102  CO2 28 29  GLUCOSE 133* 107*  BUN 17 14  CREATININE 1.08* 0.89  CALCIUM 9.0 8.7*  AST 15  --   ALT 8*  --   ALKPHOS 71  --   BILITOT 0.5  --      Microbiology Results  Results for orders placed or performed during the hospital encounter of 07/02/16  Rapid Influenza A&B Antigens ([place] only)     Status: None   Collection Time: 07/02/16 10:31 AM  Result Value Ref Range Status   Influenza A ([facility]) NEGATIVE NEGATIVE Final   Influenza B ([facility]) NEGATIVE NEGATIVE Final    RADIOLOGY:  No results found.   Management plans discussed with the patient, Her daughter and they are in agreement.  CODE STATUS: Full Code   TOTAL TIME TAKING CARE OF THIS PATIENT: 23 minutes.    Demetrios Loll M.D on 08/25/2016 at 1:27 PM  Between 7am to 6pm - Pager - [phone]  After 6pm go to [email or web address] - Proofreader  [facility]  Office  [phone]  CC: Primary care physician; Webb Silversmith, NP   Note: This dictation was prepared with Dragon dictation along with smaller phrase technology. Any transcriptional errors that result from this process are unintentional.

## 2016-08-25 NOTE — Discharge Instructions (Signed)
Heart heathy diet. Fall precaution HHPT.

## 2016-08-27 ENCOUNTER — Telehealth: Payer: Self-pay | Admitting: *Deleted

## 2016-08-27 NOTE — Telephone Encounter (Signed)
Unable to reach patient at time of TCM call. Left message for patient to return call when available.   

## 2016-08-30 ENCOUNTER — Encounter: Payer: Self-pay | Admitting: *Deleted

## 2016-08-30 NOTE — Telephone Encounter (Signed)
Per chart Review: DATE OF ADMISSION:  08/21/2016      ADMITTING PHYSICIAN: Gladstone Lighter, MD  DATE OF DISCHARGE: 08/25/2016 PRIMARY CARE PHYSICIAN: Webb Silversmith, NP   ADMISSION DIAGNOSIS:  Rectal bleeding [K62.5] Diverticulosis of large intestine with hemorrhage [K57.31] DISCHARGE DIAGNOSIS:  Active Problems:   Rectal bleeding   Diverticulosis of large intestine with hemorrhage ---- Transition Care Management Follow-up Telephone Call   How have you been since you were released from the hospital? "She is doing ok, still has some abdominal pain."   Do you understand why you were in the hospital? yes   Do you understand the discharge instructions? yes   Where were you discharged to? Home  Items Reviewed:  Medications reviewed: yes  Allergies reviewed: yes  Dietary changes reviewed: yes  Referrals reviewed: yes   Functional Questionnaire:   Activities of Daily Living (ADLs):   She states they are independent in the following: ambulation, feeding, continence, toileting and dressing States they require assistance with the following: bathing and hygiene and grooming   Any transportation issues/concerns?: no   Any patient concerns? Yes. Daughter states that she has been giving her mom her pain medication but she does not believe it is helping.   Confirmed importance and date/time of follow-up visits scheduled yes  Provider Appointment booked with Dr Garnette Gunner on 09/06/16 at 1:30.  Confirmed with patient if condition begins to worsen call PCP or go to the ER.  Patient was given the office number and encouraged to call back with question or concerns.  : yes

## 2016-08-31 ENCOUNTER — Telehealth: Payer: Self-pay

## 2016-08-31 NOTE — Telephone Encounter (Signed)
Amy Pope nurse with [facility] left v/m; pt was discharged from hospital; pt and pts daughter does not think [facility] is necessary and pt will be with [facility] PT only.

## 2016-09-01 NOTE — Telephone Encounter (Signed)
noted 

## 2016-09-02 DIAGNOSIS — N189 Chronic kidney disease, unspecified: Secondary | ICD-10-CM | POA: Diagnosis not present

## 2016-09-02 DIAGNOSIS — I2510 Atherosclerotic heart disease of native coronary artery without angina pectoris: Secondary | ICD-10-CM | POA: Diagnosis not present

## 2016-09-02 DIAGNOSIS — Z79891 Long term (current) use of opiate analgesic: Secondary | ICD-10-CM | POA: Diagnosis not present

## 2016-09-02 DIAGNOSIS — Z7982 Long term (current) use of aspirin: Secondary | ICD-10-CM | POA: Diagnosis not present

## 2016-09-02 DIAGNOSIS — Z86718 Personal history of other venous thrombosis and embolism: Secondary | ICD-10-CM | POA: Diagnosis not present

## 2016-09-02 DIAGNOSIS — M6281 Muscle weakness (generalized): Secondary | ICD-10-CM | POA: Diagnosis not present

## 2016-09-02 DIAGNOSIS — Z85038 Personal history of other malignant neoplasm of large intestine: Secondary | ICD-10-CM | POA: Diagnosis not present

## 2016-09-02 DIAGNOSIS — Z86711 Personal history of pulmonary embolism: Secondary | ICD-10-CM | POA: Diagnosis not present

## 2016-09-02 DIAGNOSIS — I5032 Chronic diastolic (congestive) heart failure: Secondary | ICD-10-CM | POA: Diagnosis not present

## 2016-09-02 DIAGNOSIS — D649 Anemia, unspecified: Secondary | ICD-10-CM | POA: Diagnosis not present

## 2016-09-02 DIAGNOSIS — I130 Hypertensive heart and chronic kidney disease with heart failure and stage 1 through stage 4 chronic kidney disease, or unspecified chronic kidney disease: Secondary | ICD-10-CM | POA: Diagnosis not present

## 2016-09-02 DIAGNOSIS — Z95828 Presence of other vascular implants and grafts: Secondary | ICD-10-CM | POA: Diagnosis not present

## 2016-09-02 DIAGNOSIS — Z85028 Personal history of other malignant neoplasm of stomach: Secondary | ICD-10-CM | POA: Diagnosis not present

## 2016-09-02 DIAGNOSIS — K5731 Diverticulosis of large intestine without perforation or abscess with bleeding: Secondary | ICD-10-CM | POA: Diagnosis not present

## 2016-09-06 ENCOUNTER — Ambulatory Visit: Payer: Medicare Other | Admitting: Internal Medicine

## 2016-09-06 NOTE — Progress Notes (Deleted)
Subjective:    Patient ID: Jill Bowers, female    DOB: [birth year], 83 y.o.   MRN: [id number]  HPI  Pt presents to the clinic today for [facility] followup. She went to the ER 08/21/16 with c/o rectal bleeding. Colonoscopy was performed and she was noted to have a diverticular bleed. She was treated with. Since discharge.  Review of Systems  Past Medical History:  Diagnosis Date  . Anemia   . Blood clot associated with vein wall inflammation   . CHF (congestive heart failure) ([place])   . Chicken pox   . Colon cancer ([place])   . Coronary artery disease   . DVT (deep venous thrombosis) ([place])   . Frequent headaches   . Gastrointestinal hemorrhage   . GERD (gastroesophageal reflux disease)   . Hyperkalemia   . Hypertension   . Malignant neoplasm of stomach ([place])   . PE (pulmonary embolism)   . Renal disorder   . Renal insufficiency   . Vitamin D deficiency     Current Outpatient Prescriptions  Medication Sig Dispense Refill  . acetaminophen (TYLENOL) 650 MG CR tablet Take 650 mg by mouth 3 (three) times daily with meals.    . Cholecalciferol (VITAMIN D-3) 1000 UNITS CAPS Take 2 capsules by mouth daily.    . furosemide (LASIX) 40 MG tablet TAKE 1 TABLET (40 MG TOTAL) BY MOUTH 2 (TWO) TIMES DAILY AS NEEDED. (Patient taking differently: Take 40 mg by mouth daily. ) 60 tablet 6  . gabapentin (NEURONTIN) 100 MG capsule Take one pill twice a day x1 week; if no side effects then take one pill thre times a day (Patient not taking: Reported on 08/30/2016) 90 capsule 3  . HYDROcodone-acetaminophen (NORCO) 7.5-325 MG tablet Take 1 tablet by mouth every 8 (eight) hours as needed for moderate pain. 90 tablet 0  . Magnesium Oxide 500 MG TABS Take 1 tablet by mouth 2 (two) times daily.    Marland Kitchen omeprazole (PRILOSEC) 20 MG capsule Take 20 mg by mouth daily.    . Polyethylene Glycol 3350 (MIRALAX PO) Take by mouth daily.    Marland Kitchen spironolactone (ALDACTONE) 25 MG tablet TAKE 1 TABLET (25 MG TOTAL) BY MOUTH 2  (TWO) TIMES DAILY AS NEEDED. (Patient taking differently: Take 25 mg by mouth daily. ) 60 tablet 3  . vitamin C (ASCORBIC ACID) 500 MG tablet Take 500 mg by mouth daily.     No current facility-administered medications for this visit.     Allergies  Allergen Reactions  . Reglan [Metoclopramide] Other (See Comments)  . Statins Other (See Comments)  . Torsemide Other (See Comments)    Family History  Problem Relation Age of Onset  . Arthritis Mother   . Hypertension Mother   . Colon cancer Sister   . Heart disease Daughter   . Colon cancer Son   . Pancreatic cancer Son     Social History   Social History  . Marital status: Widowed    Spouse name: N/A  . Number of children: N/A  . Years of education: N/A   Occupational History  . Not on file.   Social History Main Topics  . Smoking status: Never Smoker  . Smokeless tobacco: Never Used  . Alcohol use No  . Drug use: No  . Sexual activity: No   Other Topics Concern  . Not on file   Social History Narrative   Lives at home with daughter, has a walker     Constitutional:  Denies fever, malaise, fatigue, headache or abrupt weight changes.  HEENT: Denies eye pain, eye redness, ear pain, ringing in the ears, wax buildup, runny nose, nasal congestion, bloody nose, or sore throat. Respiratory: Denies difficulty breathing, shortness of breath, cough or sputum production.   Cardiovascular: Denies chest pain, chest tightness, palpitations or swelling in the hands or feet.  Gastrointestinal: Denies abdominal pain, bloating, constipation, diarrhea or blood in the stool.  GU: Denies urgency, frequency, pain with urination, burning sensation, blood in urine, odor or discharge. Musculoskeletal: Denies decrease in range of motion, difficulty with gait, muscle pain or joint pain and swelling.  Skin: Denies redness, rashes, lesions or ulcercations.  Neurological: Denies dizziness, difficulty with memory, difficulty with speech or  problems with balance and coordination.  Psych: Denies anxiety, depression, SI/HI.  No other specific complaints in a complete review of systems (except as listed in HPI above).     Objective:   Physical Exam        Assessment & Plan:

## 2016-09-07 ENCOUNTER — Ambulatory Visit: Payer: Medicare Other | Admitting: Internal Medicine

## 2016-09-07 DIAGNOSIS — I130 Hypertensive heart and chronic kidney disease with heart failure and stage 1 through stage 4 chronic kidney disease, or unspecified chronic kidney disease: Secondary | ICD-10-CM | POA: Diagnosis not present

## 2016-09-07 DIAGNOSIS — K5731 Diverticulosis of large intestine without perforation or abscess with bleeding: Secondary | ICD-10-CM | POA: Diagnosis not present

## 2016-09-07 DIAGNOSIS — I5032 Chronic diastolic (congestive) heart failure: Secondary | ICD-10-CM | POA: Diagnosis not present

## 2016-09-07 DIAGNOSIS — D649 Anemia, unspecified: Secondary | ICD-10-CM | POA: Diagnosis not present

## 2016-09-07 DIAGNOSIS — M6281 Muscle weakness (generalized): Secondary | ICD-10-CM | POA: Diagnosis not present

## 2016-09-07 DIAGNOSIS — N189 Chronic kidney disease, unspecified: Secondary | ICD-10-CM | POA: Diagnosis not present

## 2016-09-07 NOTE — Progress Notes (Deleted)
Subjective:    Patient ID: Jill Bowers, female    DOB: 20-Dec-1928, 83 y.o.   MRN: [id number]  HPI  Pt presents to the clinic today for [facility] Followup. She went to the ER 08/21/16, with c/o rectal bleeding. Colonoscopy was performed and she was found to have a diverticular bleed. Her Aspirin was held. She has a history of colon cancer and is following with oncology. There is some concern that it may have reoccurred, but they are not planning any intervention given her age and likely poor outcome.  Review of Systems      Past Medical History:  Diagnosis Date  . Anemia   . Blood clot associated with vein wall inflammation   . CHF (congestive heart failure) ([place])   . Chicken pox   . Colon cancer ([place])   . Coronary artery disease   . DVT (deep venous thrombosis) ([place])   . Frequent headaches   . Gastrointestinal hemorrhage   . GERD (gastroesophageal reflux disease)   . Hyperkalemia   . Hypertension   . Malignant neoplasm of stomach ([place])   . PE (pulmonary embolism)   . Renal disorder   . Renal insufficiency   . Vitamin D deficiency     Current Outpatient Prescriptions  Medication Sig Dispense Refill  . acetaminophen (TYLENOL) 650 MG CR tablet Take 650 mg by mouth 3 (three) times daily with meals.    . Cholecalciferol (VITAMIN D-3) 1000 UNITS CAPS Take 2 capsules by mouth daily.    . furosemide (LASIX) 40 MG tablet TAKE 1 TABLET (40 MG TOTAL) BY MOUTH 2 (TWO) TIMES DAILY AS NEEDED. (Patient taking differently: Take 40 mg by mouth daily. ) 60 tablet 6  . gabapentin (NEURONTIN) 100 MG capsule Take one pill twice a day x1 week; if no side effects then take one pill thre times a day (Patient not taking: Reported on 08/30/2016) 90 capsule 3  . HYDROcodone-acetaminophen (NORCO) 7.5-325 MG tablet Take 1 tablet by mouth every 8 (eight) hours as needed for moderate pain. 90 tablet 0  . Magnesium Oxide 500 MG TABS Take 1 tablet by mouth 2 (two) times daily.    Marland Kitchen omeprazole (PRILOSEC) 20  MG capsule Take 20 mg by mouth daily.    . Polyethylene Glycol 3350 (MIRALAX PO) Take by mouth daily.    Marland Kitchen spironolactone (ALDACTONE) 25 MG tablet TAKE 1 TABLET (25 MG TOTAL) BY MOUTH 2 (TWO) TIMES DAILY AS NEEDED. (Patient taking differently: Take 25 mg by mouth daily. ) 60 tablet 3  . vitamin C (ASCORBIC ACID) 500 MG tablet Take 500 mg by mouth daily.     No current facility-administered medications for this visit.     Allergies  Allergen Reactions  . Reglan [Metoclopramide] Other (See Comments)  . Statins Other (See Comments)  . Torsemide Other (See Comments)    Family History  Problem Relation Age of Onset  . Arthritis Mother   . Hypertension Mother   . Colon cancer Sister   . Heart disease Daughter   . Colon cancer Son   . Pancreatic cancer Son     Social History   Social History  . Marital status: Widowed    Spouse name: N/A  . Number of children: N/A  . Years of education: N/A   Occupational History  . Not on file.   Social History Main Topics  . Smoking status: Never Smoker  . Smokeless tobacco: Never Used  . Alcohol use No  . Drug  use: No  . Sexual activity: No   Other Topics Concern  . Not on file   Social History Narrative   Lives at home with daughter, has a walker     Constitutional: Denies fever, malaise, fatigue, headache or abrupt weight changes.  HEENT: Denies eye pain, eye redness, ear pain, ringing in the ears, wax buildup, runny nose, nasal congestion, bloody nose, or sore throat. Respiratory: Denies difficulty breathing, shortness of breath, cough or sputum production.   Cardiovascular: Denies chest pain, chest tightness, palpitations or swelling in the hands or feet.  Gastrointestinal: Denies abdominal pain, bloating, constipation, diarrhea or blood in the stool.  GU: Denies urgency, frequency, pain with urination, burning sensation, blood in urine, odor or discharge. Musculoskeletal: Denies decrease in range of motion, difficulty with  gait, muscle pain or joint pain and swelling.  Skin: Denies redness, rashes, lesions or ulcercations.  Neurological: Denies dizziness, difficulty with memory, difficulty with speech or problems with balance and coordination.  Psych: Denies anxiety, depression, SI/HI.  No other specific complaints in a complete review of systems (except as listed in HPI above).  Objective:   Physical Exam        Assessment & Plan:   [facility] Followup for Rectal Bleeding secondary to Diverticular Bleed, with History of Colon Cancer:  Hospital notes, labs and imaging reviewed  RTC as needed or if symptoms persist or worsen Anyah Swallow, NP

## 2016-09-09 DIAGNOSIS — I130 Hypertensive heart and chronic kidney disease with heart failure and stage 1 through stage 4 chronic kidney disease, or unspecified chronic kidney disease: Secondary | ICD-10-CM | POA: Diagnosis not present

## 2016-09-09 DIAGNOSIS — M6281 Muscle weakness (generalized): Secondary | ICD-10-CM | POA: Diagnosis not present

## 2016-09-09 DIAGNOSIS — D649 Anemia, unspecified: Secondary | ICD-10-CM | POA: Diagnosis not present

## 2016-09-09 DIAGNOSIS — I5032 Chronic diastolic (congestive) heart failure: Secondary | ICD-10-CM | POA: Diagnosis not present

## 2016-09-09 DIAGNOSIS — K5731 Diverticulosis of large intestine without perforation or abscess with bleeding: Secondary | ICD-10-CM | POA: Diagnosis not present

## 2016-09-09 DIAGNOSIS — N189 Chronic kidney disease, unspecified: Secondary | ICD-10-CM | POA: Diagnosis not present

## 2016-09-14 DIAGNOSIS — M6281 Muscle weakness (generalized): Secondary | ICD-10-CM | POA: Diagnosis not present

## 2016-09-14 DIAGNOSIS — I5032 Chronic diastolic (congestive) heart failure: Secondary | ICD-10-CM | POA: Diagnosis not present

## 2016-09-14 DIAGNOSIS — D649 Anemia, unspecified: Secondary | ICD-10-CM | POA: Diagnosis not present

## 2016-09-14 DIAGNOSIS — I130 Hypertensive heart and chronic kidney disease with heart failure and stage 1 through stage 4 chronic kidney disease, or unspecified chronic kidney disease: Secondary | ICD-10-CM | POA: Diagnosis not present

## 2016-09-14 DIAGNOSIS — K5731 Diverticulosis of large intestine without perforation or abscess with bleeding: Secondary | ICD-10-CM | POA: Diagnosis not present

## 2016-09-14 DIAGNOSIS — N189 Chronic kidney disease, unspecified: Secondary | ICD-10-CM | POA: Diagnosis not present

## 2016-09-16 ENCOUNTER — Encounter: Payer: Self-pay | Admitting: Internal Medicine

## 2016-09-16 ENCOUNTER — Ambulatory Visit (INDEPENDENT_AMBULATORY_CARE_PROVIDER_SITE_OTHER): Payer: Medicare Other | Admitting: Internal Medicine

## 2016-09-16 VITALS — BP 118/78 | HR 73 | Temp 98.5°F | Wt 206.8 lb

## 2016-09-16 DIAGNOSIS — Z85038 Personal history of other malignant neoplasm of large intestine: Secondary | ICD-10-CM | POA: Diagnosis not present

## 2016-09-16 DIAGNOSIS — I130 Hypertensive heart and chronic kidney disease with heart failure and stage 1 through stage 4 chronic kidney disease, or unspecified chronic kidney disease: Secondary | ICD-10-CM | POA: Diagnosis not present

## 2016-09-16 DIAGNOSIS — M4696 Unspecified inflammatory spondylopathy, lumbar region: Secondary | ICD-10-CM | POA: Diagnosis not present

## 2016-09-16 DIAGNOSIS — K5733 Diverticulitis of large intestine without perforation or abscess with bleeding: Secondary | ICD-10-CM | POA: Diagnosis not present

## 2016-09-16 DIAGNOSIS — K5731 Diverticulosis of large intestine without perforation or abscess with bleeding: Secondary | ICD-10-CM | POA: Diagnosis not present

## 2016-09-16 DIAGNOSIS — I5032 Chronic diastolic (congestive) heart failure: Secondary | ICD-10-CM | POA: Diagnosis not present

## 2016-09-16 DIAGNOSIS — N189 Chronic kidney disease, unspecified: Secondary | ICD-10-CM | POA: Diagnosis not present

## 2016-09-16 DIAGNOSIS — R1013 Epigastric pain: Secondary | ICD-10-CM | POA: Diagnosis not present

## 2016-09-16 DIAGNOSIS — M6281 Muscle weakness (generalized): Secondary | ICD-10-CM | POA: Diagnosis not present

## 2016-09-16 DIAGNOSIS — D649 Anemia, unspecified: Secondary | ICD-10-CM | POA: Diagnosis not present

## 2016-09-16 DIAGNOSIS — M47816 Spondylosis without myelopathy or radiculopathy, lumbar region: Secondary | ICD-10-CM

## 2016-09-16 MED ORDER — OXYCODONE-ACETAMINOPHEN 10-325 MG PO TABS: 1.0000 | ORAL_TABLET | Freq: Two times a day (BID) | ORAL | 0 refills | Status: DC | PRN

## 2016-09-16 MED ORDER — PREDNISONE 10 MG PO TABS: ORAL_TABLET | ORAL | 0 refills | Status: DC

## 2016-09-16 NOTE — Progress Notes (Signed)
Subjective:    Patient ID: Jill Bowers, female    DOB: 1928/09/29, 83 y.o.   MRN: [id number]  HPI  Pt presents to the clinic today for hospital follow up. She went to the ER 08/21/16 with c/o back pain, abdominal pain rectal bleeding. CT scan of the abdomen showed:  IMPRESSION: No acute findings in the abdomen/pelvis. Moderate colonic diverticulosis without active inflammation. Stable hepatic cysts.  Stable right renal cyst. Mild cardiomegaly. Atherosclerotic coronary artery disease. Aortic atherosclerosis. Stable appearance of the pancreas with prominence of the main pancreatic duct and several scattered small cystic lesions. Small left inguinal hernia containing only peritoneal fat.  Colonoscopy was done and multiple large diverticula were found. They felt like her rectal bleeding was likely a diverticular bleed. She does have a history of colon cancer. She is following with oncology. There has been no evidence of reoccurrence on CT scan, PET scan, but her CEA is rising. No intervention is planned at this time. She is following the [place].  Since discharge, her daughter reports that she is still complaining of back pain. They started her on Gabapentin in the hospital, but her daughter reports she has severe nightmares with it. She describes the low back pain as constant, achy pain. The pain does not radiate. She denies numbness or tingling in her legs.  It is worse in the morning and throughout the day. She is not very active, sits in her recliner most of the day. She has known arthritis in her lower back. She has already been referred for home PT. Her daughter reports the Tramadol and Norco are not effective.   Review of Systems      Past Medical History:  Diagnosis Date  . Anemia   . Blood clot associated with vein wall inflammation   . CHF (congestive heart failure) ([place])   . Chicken pox   . Colon cancer ([place])   . Coronary artery disease   . DVT (deep venous thrombosis)  ([place])   . Frequent headaches   . Gastrointestinal hemorrhage   . GERD (gastroesophageal reflux disease)   . Hyperkalemia   . Hypertension   . Malignant neoplasm of stomach ([place])   . PE (pulmonary embolism)   . Renal disorder   . Renal insufficiency   . Vitamin D deficiency     Current Outpatient Prescriptions  Medication Sig Dispense Refill  . acetaminophen (TYLENOL) 650 MG CR tablet Take 650 mg by mouth 3 (three) times daily with meals.    . Cholecalciferol (VITAMIN D-3) 1000 UNITS CAPS Take 2 capsules by mouth daily.    . furosemide (LASIX) 40 MG tablet TAKE 1 TABLET (40 MG TOTAL) BY MOUTH 2 (TWO) TIMES DAILY AS NEEDED. (Patient taking differently: Take 40 mg by mouth daily. ) 60 tablet 6  . gabapentin (NEURONTIN) 100 MG capsule Take one pill twice a day x1 week; if no side effects then take one pill thre times a day (Patient not taking: Reported on 08/30/2016) 90 capsule 3  . HYDROcodone-acetaminophen (NORCO) 7.5-325 MG tablet Take 1 tablet by mouth every 8 (eight) hours as needed for moderate pain. 90 tablet 0  . Magnesium Oxide 500 MG TABS Take 1 tablet by mouth 2 (two) times daily.    Marland Kitchen omeprazole (PRILOSEC) 20 MG capsule Take 20 mg by mouth daily.    . Polyethylene Glycol 3350 (MIRALAX PO) Take by mouth daily.    Marland Kitchen spironolactone (ALDACTONE) 25 MG tablet TAKE 1 TABLET (25 MG TOTAL) BY MOUTH  2 (TWO) TIMES DAILY AS NEEDED. (Patient taking differently: Take 25 mg by mouth daily. ) 60 tablet 3  . vitamin C (ASCORBIC ACID) 500 MG tablet Take 500 mg by mouth daily.     No current facility-administered medications for this visit.     Allergies  Allergen Reactions  . Reglan [Metoclopramide] Other (See Comments)  . Statins Other (See Comments)  . Torsemide Other (See Comments)    Family History  Problem Relation Age of Onset  . Arthritis Mother   . Hypertension Mother   . Colon cancer Sister   . Heart disease Daughter   . Colon cancer Son   . Pancreatic cancer Son     Social  History   Social History  . Marital status: Widowed    Spouse name: N/A  . Number of children: N/A  . Years of education: N/A   Occupational History  . Not on file.   Social History Main Topics  . Smoking status: Never Smoker  . Smokeless tobacco: Never Used  . Alcohol use No  . Drug use: No  . Sexual activity: No   Other Topics Concern  . Not on file   Social History Narrative   Lives at home with daughter, has a walker     Constitutional: Denies fever, malaise, fatigue, headache or abrupt weight changes.  Respiratory: Denies difficulty breathing, shortness of breath, cough or sputum production.   Cardiovascular: Denies chest pain, chest tightness, palpitations or swelling in the hands or feet.  Gastrointestinal: Denies abdominal pain, bloating, constipation, diarrhea or blood in the stool.  GU: Denies urgency, frequency, pain with urination, burning sensation, blood in urine, odor or discharge. Musculoskeletal: Pt reports low back pain.  Denies decrease in range of motion, muscle pain or joint swelling.    No other specific complaints in a complete review of systems (except as listed in HPI above).  Objective:   Physical Exam   BP 118/78   Pulse 73   Temp 98.5 F (36.9 C) (Oral)   Wt 206 lb 12 oz (93.8 kg)   SpO2 96%   BMI 36.62 kg/m  Wt Readings from Last 3 Encounters:  09/16/16 206 lb 12 oz (93.8 kg)  08/21/16 205 lb 9.6 oz (93.3 kg)  08/16/16 205 lb (93 kg)    General: Appears her stated age, obese, chronically ill appearing, in NAD. Cardiovascular: Normal rate and rhythm.  Pulmonary/Chest: Normal effort and positive vesicular breath sounds. No respiratory distress. No wheezes, rales or ronchi noted.  Abdomen: Soft and nontender. Normal bowel sounds.  Musculoskeletal: Exam difficult because she is unable to get out of the wheelchair. She has bony tenderness noted over the lumbar spine. Neurological: Alert and oriented.    BMET    Component Value  Date/Time   NA 139 08/22/2016 0426   NA 141 09/11/2015 0920   K 3.6 08/22/2016 0426   CL 102 08/22/2016 0426   CO2 29 08/22/2016 0426   GLUCOSE 107 (H) 08/22/2016 0426   BUN 14 08/22/2016 0426   BUN 25 09/11/2015 0920   CREATININE 0.89 08/22/2016 0426   CALCIUM 8.7 (L) 08/22/2016 0426   GFRNONAA 57 (L) 08/22/2016 0426   GFRAA >60 08/22/2016 0426    Lipid Panel     Component Value Date/Time   CHOL 172 06/12/2015 1125   TRIG 155.0 (H) 06/12/2015 1125   HDL 52.60 06/12/2015 1125   CHOLHDL 3 06/12/2015 1125   VLDL 31.0 06/12/2015 1125   LDLCALC 89 06/12/2015  1125    CBC    Component Value Date/Time   WBC 5.7 08/22/2016 0426   RBC 3.60 (L) 08/22/2016 0426   HGB 12.3 08/22/2016 2155   HCT 32.5 (L) 08/22/2016 0426   PLT 203 08/22/2016 0426   MCV 90.4 08/22/2016 0426   MCH 31.4 08/22/2016 0426   MCHC 34.7 08/22/2016 0426   RDW 13.7 08/22/2016 0426   LYMPHSABS 2.0 08/16/2016 1333   MONOABS 0.6 08/16/2016 1333   EOSABS 0.1 08/16/2016 1333   BASOSABS 0.0 08/16/2016 1333    Hgb A1C Lab Results  Component Value Date   HGBA1C 5.8 06/12/2015           Assessment & Plan:  Hospital Followup for Diverticular Bleed:  Hospital notes, labs, imaging and procedures reviewed Repeat CBC today Continue to stay off the ASA for now Continue Prilosec  Low Back Pain:  Likely OA Continue to work with PT Stop Norco, RX for [employer] provided today If no improvement, consider referral for physical medicine vs palliative care  Make an appt for your Medicare Wellness Exam Webb Silversmith, NP

## 2016-09-16 NOTE — Patient Instructions (Signed)
Back Pain, Adult  Back pain is very common. The pain often gets better over time. The cause of back pain is usually not dangerous. Most people can learn to manage their back pain on their own.  Follow these instructions at home:  Watch your back pain for any changes. The following actions may help to lessen any pain you are feeling:  · Stay active. Start with short walks on flat ground if you can. Try to walk farther each day.  · Exercise regularly as told by your doctor. Exercise helps your back heal faster. It also helps avoid future injury by keeping your muscles strong and flexible.  · Do not sit, drive, or stand in one place for more than 30 minutes.  · Do not stay in bed. Resting more than 1-2 days can slow down your recovery.  · Be careful when you bend or lift an object. Use good form when lifting:  ? Bend at your knees.  ? Keep the object close to your body.  ? Do not twist.  · Sleep on a firm mattress. Lie on your side, and bend your knees. If you lie on your back, put a pillow under your knees.  · Take medicines only as told by your doctor.  · Put ice on the injured area.  ? Put ice in a plastic bag.  ? Place a towel between your skin and the bag.  ? Leave the ice on for 20 minutes, 2-3 times a day for the first 2-3 days. After that, you can switch between ice and heat packs.  · Avoid feeling anxious or stressed. Find good ways to deal with stress, such as exercise.  · Maintain a healthy weight. Extra weight puts stress on your back.    Contact a doctor if:  · You have pain that does not go away with rest or medicine.  · You have worsening pain that goes down into your legs or buttocks.  · You have pain that does not get better in one week.  · You have pain at night.  · You lose weight.  · You have a fever or chills.  Get help right away if:  · You cannot control when you poop (bowel movement) or pee (urinate).  · Your arms or legs feel weak.  · Your arms or legs lose feeling (numbness).  · You feel sick  to your stomach (nauseous) or throw up (vomit).  · You have belly (abdominal) pain.  · You feel like you may pass out (faint).  This information is not intended to replace advice given to you by your health care provider. Make sure you discuss any questions you have with your health care provider.  Document Released: 12/01/2007 Document Revised: 11/20/2015 Document Reviewed: 10/16/2013  Elsevier Interactive Patient Education © 2017 Elsevier Inc.

## 2016-09-21 DIAGNOSIS — I130 Hypertensive heart and chronic kidney disease with heart failure and stage 1 through stage 4 chronic kidney disease, or unspecified chronic kidney disease: Secondary | ICD-10-CM | POA: Diagnosis not present

## 2016-09-21 DIAGNOSIS — M6281 Muscle weakness (generalized): Secondary | ICD-10-CM | POA: Diagnosis not present

## 2016-09-21 DIAGNOSIS — D649 Anemia, unspecified: Secondary | ICD-10-CM | POA: Diagnosis not present

## 2016-09-21 DIAGNOSIS — N189 Chronic kidney disease, unspecified: Secondary | ICD-10-CM | POA: Diagnosis not present

## 2016-09-21 DIAGNOSIS — K5731 Diverticulosis of large intestine without perforation or abscess with bleeding: Secondary | ICD-10-CM | POA: Diagnosis not present

## 2016-09-21 DIAGNOSIS — I5032 Chronic diastolic (congestive) heart failure: Secondary | ICD-10-CM | POA: Diagnosis not present

## 2016-09-23 DIAGNOSIS — M6281 Muscle weakness (generalized): Secondary | ICD-10-CM | POA: Diagnosis not present

## 2016-09-23 DIAGNOSIS — I130 Hypertensive heart and chronic kidney disease with heart failure and stage 1 through stage 4 chronic kidney disease, or unspecified chronic kidney disease: Secondary | ICD-10-CM | POA: Diagnosis not present

## 2016-09-23 DIAGNOSIS — K5731 Diverticulosis of large intestine without perforation or abscess with bleeding: Secondary | ICD-10-CM | POA: Diagnosis not present

## 2016-09-23 DIAGNOSIS — D649 Anemia, unspecified: Secondary | ICD-10-CM | POA: Diagnosis not present

## 2016-09-23 DIAGNOSIS — I5032 Chronic diastolic (congestive) heart failure: Secondary | ICD-10-CM | POA: Diagnosis not present

## 2016-09-23 DIAGNOSIS — N189 Chronic kidney disease, unspecified: Secondary | ICD-10-CM | POA: Diagnosis not present

## 2016-09-27 ENCOUNTER — Inpatient Hospital Stay: Payer: Medicare Other | Admitting: Internal Medicine

## 2016-09-29 DIAGNOSIS — N189 Chronic kidney disease, unspecified: Secondary | ICD-10-CM | POA: Diagnosis not present

## 2016-09-29 DIAGNOSIS — I5032 Chronic diastolic (congestive) heart failure: Secondary | ICD-10-CM | POA: Diagnosis not present

## 2016-09-29 DIAGNOSIS — D649 Anemia, unspecified: Secondary | ICD-10-CM | POA: Diagnosis not present

## 2016-09-29 DIAGNOSIS — I130 Hypertensive heart and chronic kidney disease with heart failure and stage 1 through stage 4 chronic kidney disease, or unspecified chronic kidney disease: Secondary | ICD-10-CM | POA: Diagnosis not present

## 2016-09-29 DIAGNOSIS — K5731 Diverticulosis of large intestine without perforation or abscess with bleeding: Secondary | ICD-10-CM | POA: Diagnosis not present

## 2016-09-29 DIAGNOSIS — M6281 Muscle weakness (generalized): Secondary | ICD-10-CM | POA: Diagnosis not present

## 2016-10-01 ENCOUNTER — Inpatient Hospital Stay: Payer: Medicare Other | Attending: Internal Medicine | Admitting: Internal Medicine

## 2016-10-01 VITALS — BP 120/54 | HR 74 | Temp 97.8°F | Resp 16 | Wt 202.0 lb

## 2016-10-01 DIAGNOSIS — Z86718 Personal history of other venous thrombosis and embolism: Secondary | ICD-10-CM

## 2016-10-01 DIAGNOSIS — E559 Vitamin D deficiency, unspecified: Secondary | ICD-10-CM

## 2016-10-01 DIAGNOSIS — K4090 Unilateral inguinal hernia, without obstruction or gangrene, not specified as recurrent: Secondary | ICD-10-CM

## 2016-10-01 DIAGNOSIS — Z800 Family history of malignant neoplasm of digestive organs: Secondary | ICD-10-CM | POA: Diagnosis not present

## 2016-10-01 DIAGNOSIS — I110 Hypertensive heart disease with heart failure: Secondary | ICD-10-CM

## 2016-10-01 DIAGNOSIS — R938 Abnormal findings on diagnostic imaging of other specified body structures: Secondary | ICD-10-CM

## 2016-10-01 DIAGNOSIS — R109 Unspecified abdominal pain: Secondary | ICD-10-CM

## 2016-10-01 DIAGNOSIS — C772 Secondary and unspecified malignant neoplasm of intra-abdominal lymph nodes: Secondary | ICD-10-CM | POA: Diagnosis not present

## 2016-10-01 DIAGNOSIS — E041 Nontoxic single thyroid nodule: Secondary | ICD-10-CM | POA: Diagnosis not present

## 2016-10-01 DIAGNOSIS — Z85038 Personal history of other malignant neoplasm of large intestine: Secondary | ICD-10-CM

## 2016-10-01 DIAGNOSIS — K5730 Diverticulosis of large intestine without perforation or abscess without bleeding: Secondary | ICD-10-CM

## 2016-10-01 DIAGNOSIS — I2510 Atherosclerotic heart disease of native coronary artery without angina pectoris: Secondary | ICD-10-CM | POA: Diagnosis not present

## 2016-10-01 DIAGNOSIS — I700 Atherosclerosis of aorta: Secondary | ICD-10-CM | POA: Diagnosis not present

## 2016-10-01 DIAGNOSIS — I509 Heart failure, unspecified: Secondary | ICD-10-CM

## 2016-10-01 DIAGNOSIS — Z86711 Personal history of pulmonary embolism: Secondary | ICD-10-CM

## 2016-10-01 DIAGNOSIS — D649 Anemia, unspecified: Secondary | ICD-10-CM | POA: Diagnosis not present

## 2016-10-01 DIAGNOSIS — Z79899 Other long term (current) drug therapy: Secondary | ICD-10-CM | POA: Diagnosis not present

## 2016-10-01 DIAGNOSIS — R9389 Abnormal findings on diagnostic imaging of other specified body structures: Secondary | ICD-10-CM

## 2016-10-01 DIAGNOSIS — C189 Malignant neoplasm of colon, unspecified: Secondary | ICD-10-CM | POA: Diagnosis not present

## 2016-10-01 MED ORDER — AMITRIPTYLINE HCL 25 MG PO TABS: 25.0000 mg | ORAL_TABLET | Freq: Every day | ORAL | 4 refills | Status: DC

## 2016-10-01 NOTE — Progress Notes (Signed)
[facility] OFFICE PROGRESS NOTE  Patient Care Team: Jearld Fenton, NP as PCP - General (Internal Medicine)   SUMMARY OF ONCOLOGIC HISTORY:  Oncology History   # July 2016- COLON CANCER; mod diff adeno; STAGE III (pT4aN1a; MSI-STABLE) [s/p surgery;Dr. Arbutus [facility] [place]];No adjuvant chemo sec to co-morbdities DEC 2016- CT-C/ A/P [[facility]]- subcentimeter hepatic cysts ; irregular soft tissue density in the peritoneal fat within pelvis ? Scarring  [less likely recurrence];   # DEC 2017- CT-NED [but CEA rising ~20]  # July R LE DVT/Bil PE s/p IVC filter; Hx of GIB; AKI; Hx of Severe Anemia; hx CHF     Colon cancer metastasized to intra-abdominal lymph node ([place])     INTERVAL HISTORY: Patient is a poor historian. Daughter accompanying the patient.  83 year old female patient with above history of stage III with multiple comorbidities- unable to tolerate adjuvant chemotherapy; And history of rising CEA [February 2017 up to 20] is here for follow-up.  Patient was admitted to the hospital for rectal bleeding- colonoscopy showed possible hemorrhoidal. No evidence of any malignancy.  Patient continues to complain of worsening abdominal pain around the site of the previous surgery. Patient is able to tolerate Neurontin. Stop. Denies any nausea vomiting or constipation. Patient is currently on Percocet twice a day. Not improved. Denies any vaginal bleeding.  REVIEW OF SYSTEMS:  A complete 10 point review of system is done which is negative except mentioned above/history of present illness.   PAST MEDICAL HISTORY :  Past Medical History:  Diagnosis Date  . Anemia   . Blood clot associated with vein wall inflammation   . CHF (congestive heart failure) ([place])   . Chicken pox   . Colon cancer ([place])   . Coronary artery disease   . DVT (deep venous thrombosis) ([place])   . Frequent headaches   . Gastrointestinal hemorrhage   . GERD (gastroesophageal reflux disease)   .  Hyperkalemia   . Hypertension   . Malignant neoplasm of stomach ([place])   . PE (pulmonary embolism)   . Renal disorder   . Renal insufficiency   . Vitamin D deficiency     PAST SURGICAL HISTORY :   Past Surgical History:  Procedure Laterality Date  . ABDOMINAL HYSTERECTOMY    . COLON SURGERY    . COLONOSCOPY WITH PROPOFOL N/A 08/23/2016   Procedure: COLONOSCOPY WITH PROPOFOL;  Surgeon: Jonathon Bellows, MD;  Location: [facility] ENDOSCOPY;  Service: Gastroenterology;  Laterality: N/A;  . COLONOSCOPY WITH PROPOFOL N/A 08/24/2016   Procedure: COLONOSCOPY WITH PROPOFOL;  Surgeon: Jonathon Bellows, MD;  Location: [facility] ENDOSCOPY;  Service: Endoscopy;  Laterality: N/A;  . IVC FILTER PLACEMENT ([place] HX)    . JOINT REPLACEMENT Right    knee    FAMILY HISTORY :   Family History  Problem Relation Age of Onset  . Arthritis Mother   . Hypertension Mother   . Colon cancer Sister   . Heart disease Daughter   . Colon cancer Son   . Pancreatic cancer Son     SOCIAL HISTORY:   Social History  Substance Use Topics  . Smoking status: Never Smoker  . Smokeless tobacco: Never Used  . Alcohol use No    ALLERGIES:  is allergic to gabapentin; reglan [metoclopramide]; statins; and torsemide.  MEDICATIONS:  Current Outpatient Prescriptions  Medication Sig Dispense Refill  . acetaminophen (TYLENOL) 650 MG CR tablet Take 650 mg by mouth 3 (three) times daily with meals.    . Cholecalciferol (  VITAMIN D-3) 1000 UNITS CAPS Take 2 capsules by mouth daily.    . furosemide (LASIX) 40 MG tablet TAKE 1 TABLET (40 MG TOTAL) BY MOUTH 2 (TWO) TIMES DAILY AS NEEDED. (Patient taking differently: Take 40 mg by mouth daily. ) 60 tablet 6  . Magnesium Oxide 500 MG TABS Take 1 tablet by mouth 2 (two) times daily.    Marland Kitchen MEGARED OMEGA-3 KRILL OIL 500 MG CAPS Take by mouth.    Marland Kitchen omeprazole (PRILOSEC) 20 MG capsule Take 20 mg by mouth daily.    Marland Kitchen oxyCODONE-acetaminophen (PERCOCET) 10-325 MG tablet Take 1 tablet by mouth 2 (two) times  daily as needed for pain. 60 tablet 0  . Polyethylene Glycol 3350 (MIRALAX PO) Take by mouth daily.    Marland Kitchen spironolactone (ALDACTONE) 25 MG tablet TAKE 1 TABLET (25 MG TOTAL) BY MOUTH 2 (TWO) TIMES DAILY AS NEEDED. (Patient taking differently: Take 25 mg by mouth daily. ) 60 tablet 3  . vitamin C (ASCORBIC ACID) 500 MG tablet Take 500 mg by mouth daily.    Marland Kitchen amitriptyline (ELAVIL) 25 MG tablet Take 1 tablet (25 mg total) by mouth at bedtime. 30 tablet 4   No current facility-administered medications for this visit.     PHYSICAL EXAMINATION: ECOG PERFORMANCE STATUS: 3 - Symptomatic, >50% confined to bed  BP (!) 120/54 (BP Location: Left Arm, Patient Position: Sitting)   Pulse 74   Temp 97.8 F (36.6 C) (Tympanic)   Resp 16   Wt 202 lb (91.6 kg)   SpO2 95%   BMI 35.78 kg/m   Filed Weights   10/01/16 1045  Weight: 202 lb (91.6 kg)    Accompanied by her daughter.  ENERAL: Well-nourished well-developed; Alert, no distress and comfortable. In wheel chair.  EYES: no pallor or icterus OROPHARYNX: no thrush or ulceration; good dentition  NECK: supple, no masses felt LYMPH: no palpable lymphadenopathy in the cervical, axillary or inguinal regions LUNGS: clear to auscultation and No wheeze or crackles HEART/CVS: regular rate & rhythm and no murmurs; bilateral lower extremity swelling. ABDOMEN: abdomen soft, non-tender and normal bowel sounds Musculoskeletal:no cyanosis of digits and no clubbing  PSYCH: alert & oriented x 3 with fluent speech NEURO: no focal motor/sensory deficits SKIN: no rashes or significant lesions   LABORATORY DATA:  I have reviewed the data as listed    Component Value Date/Time   NA 139 08/22/2016 0426   NA 141 09/11/2015 0920   K 3.6 08/22/2016 0426   CL 102 08/22/2016 0426   CO2 29 08/22/2016 0426   GLUCOSE 107 (H) 08/22/2016 0426   BUN 14 08/22/2016 0426   BUN 25 09/11/2015 0920   CREATININE 0.89 08/22/2016 0426   CALCIUM 8.7 (L) 08/22/2016  0426   PROT 7.0 08/21/2016 1346   ALBUMIN 3.6 08/21/2016 1346   AST 15 08/21/2016 1346   ALT 8 (L) 08/21/2016 1346   ALKPHOS 71 08/21/2016 1346   BILITOT 0.5 08/21/2016 1346   GFRNONAA 57 (L) 08/22/2016 0426   GFRAA >60 08/22/2016 0426    No results found for: SPEP, UPEP  Lab Results  Component Value Date   WBC 5.7 08/22/2016   NEUTROABS 2.5 08/16/2016   HGB 12.3 08/22/2016   HCT 32.5 (L) 08/22/2016   MCV 90.4 08/22/2016   PLT 203 08/22/2016      Chemistry      Component Value Date/Time   NA 139 08/22/2016 0426   NA 141 09/11/2015 0920   K 3.6 08/22/2016 0426  CL 102 08/22/2016 0426   CO2 29 08/22/2016 0426   BUN 14 08/22/2016 0426   BUN 25 09/11/2015 0920   CREATININE 0.89 08/22/2016 0426      Component Value Date/Time   CALCIUM 8.7 (L) 08/22/2016 0426   ALKPHOS 71 08/21/2016 1346   AST 15 08/21/2016 1346   ALT 8 (L) 08/21/2016 1346   BILITOT 0.5 08/21/2016 1346     IMPRESSION: No acute findings in the abdomen/pelvis.  Moderate colonic diverticulosis without active inflammation.  Stable hepatic cysts.  Stable right renal cyst.  Mild cardiomegaly. Atherosclerotic coronary artery disease. Aortic atherosclerosis.  Stable appearance of the pancreas with prominence of the main pancreatic duct and several scattered small cystic lesions.  Small left inguinal hernia containing only peritoneal fat.   Electronically Signed   By: Marin Olp M.D.   On: 08/21/2016 17:15  ASSESSMENT & PLAN:   Colon cancer metastasized to intra-abdominal lymph node ([place]) # Colon cancer-stage III [pT4N1a] with multiple liver cysts on the CT scan]. No adjuvant therapy given multiple comorbidities for performance status. PET scan-no evidence of recurrence; shows thyroid nodule [C discussion below]. CT scan- Feb 2018- NED; colonoscopy- recent negative.   # Abdominal pain- unclear etiology. Incisional question neuropathic.poor tolerance to Neurontin; recommend trial of  amitriptyline;discussed the side effects. A new script given.   #Endometrial thickening- no vaginal bleeding.referral to gyn; at [facility] [pt preference]  # Left thyroid nodule- question malignancy; monitor for now given comorbidities at this time.  # Follow up in appx 4 months/ no labs; [daughter- [phone]]  with CEA.  cC: [name], NP- [facility]; [place].      Cammie Sickle, MD 10/01/2016 1:15 PM

## 2016-10-01 NOTE — Progress Notes (Signed)
Patient here today for follow up.  Patient states no new concerns today, c/o abdominal pain

## 2016-10-01 NOTE — Assessment & Plan Note (Addendum)
#   Colon cancer-stage III [pT4N1a] with multiple liver cysts on the CT scan]. No adjuvant therapy given multiple comorbidities for performance status. PET scan-no evidence of recurrence; shows thyroid nodule [C discussion below]. CT scan- Feb 2018- NED; colonoscopy- recent negative.   # Abdominal pain- unclear etiology. Incisional question neuropathic.poor tolerance to Neurontin; recommend trial of amitriptyline;discussed the side effects. A new script given.   #Endometrial thickening- no vaginal bleeding.referral to gyn; at [facility] [pt preference]  # Left thyroid nodule- question malignancy; monitor for now given comorbidities at this time.  # Follow up in appx 4 months/ no labs; [daughter- [phone]]  with CEA.  cC: [name], NP- [facility]; [place].

## 2016-10-11 ENCOUNTER — Telehealth: Payer: Self-pay | Admitting: *Deleted

## 2016-10-11 NOTE — Telephone Encounter (Signed)
Per Dr.Brahmanday No 90 day prescription

## 2016-10-11 NOTE — Telephone Encounter (Signed)
A a 90 day supply of Amitriptyline. She just got a 30 day supply 10/01/16. Please advise

## 2016-10-12 ENCOUNTER — Other Ambulatory Visit: Payer: Self-pay

## 2016-10-12 MED ORDER — SPIRONOLACTONE 25 MG PO TABS: 25.0000 mg | ORAL_TABLET | Freq: Two times a day (BID) | ORAL | 3 refills | Status: DC | PRN

## 2016-10-12 NOTE — Telephone Encounter (Signed)
Refill sent for spironolactone  

## 2016-10-27 ENCOUNTER — Other Ambulatory Visit: Payer: Medicare Other

## 2016-10-27 ENCOUNTER — Ambulatory Visit: Payer: Medicare Other

## 2016-11-03 ENCOUNTER — Ambulatory Visit: Payer: Medicare Other | Admitting: Internal Medicine

## 2016-11-05 ENCOUNTER — Other Ambulatory Visit: Payer: Self-pay | Admitting: Cardiovascular Disease

## 2016-11-08 ENCOUNTER — Other Ambulatory Visit: Payer: Self-pay

## 2016-11-08 MED ORDER — OXYCODONE-ACETAMINOPHEN 10-325 MG PO TABS: 1.0000 | ORAL_TABLET | Freq: Two times a day (BID) | ORAL | 0 refills | Status: DC | PRN

## 2016-11-08 NOTE — Telephone Encounter (Signed)
Jill Bowers (DPR signed) left v/m requesting rx oxycodone apap. Call when ready for pick up. Pt last seen and rx last printed # 60 on 09/16/16.

## 2016-11-08 NOTE — Telephone Encounter (Signed)
Rx left in front office for pick up and daughter is aware

## 2016-11-08 NOTE — Telephone Encounter (Signed)
RX printed and signed and placed in MYD box 

## 2016-11-16 DIAGNOSIS — N882 Stricture and stenosis of cervix uteri: Secondary | ICD-10-CM | POA: Diagnosis not present

## 2016-11-16 DIAGNOSIS — R938 Abnormal findings on diagnostic imaging of other specified body structures: Secondary | ICD-10-CM | POA: Diagnosis not present

## 2016-11-24 DIAGNOSIS — R938 Abnormal findings on diagnostic imaging of other specified body structures: Secondary | ICD-10-CM | POA: Diagnosis not present

## 2017-01-03 ENCOUNTER — Other Ambulatory Visit: Payer: Self-pay

## 2017-01-03 MED ORDER — OXYCODONE-ACETAMINOPHEN 10-325 MG PO TABS: 1.0000 | ORAL_TABLET | Freq: Two times a day (BID) | ORAL | 0 refills | Status: DC | PRN

## 2017-01-03 NOTE — Telephone Encounter (Signed)
Jill Bowers (DPR signed) left v/m requesting rx oxycodone apap. Call when ready for pick up. Last printed # 60 on 11/08/16; last seen 09/16/16.

## 2017-01-03 NOTE — Telephone Encounter (Signed)
Rx left in front office for pick up and daughter is aware

## 2017-01-03 NOTE — Telephone Encounter (Signed)
RX printed and signed and placed in MYD box 

## 2017-01-27 DIAGNOSIS — I5032 Chronic diastolic (congestive) heart failure: Secondary | ICD-10-CM | POA: Insufficient documentation

## 2017-01-27 NOTE — Progress Notes (Deleted)
Cardiology Office Note  Date:  01/27/2017   ID:  Jill Bowers, DOB 07-Oct-1928, MRN [id number]  PCP:  Jearld Fenton, NP   No chief complaint on file.   HPI:  Jill Bowers is a pleasant 83 year old woman With history of bradycardia , hospitalized at [facility] For bradycardia,  history of DVT, PE, IVC filter in place,  prior rectal bleeding, not on anticoagulation anemia,  colon cancer, followed by oncology, stage III  visit to the emergency room for shortness of breath,  she presents today for follow-up of her bradycardia    In follow-up she presents with her daughter  05/2016: she started getting sick with cough, viral-type symptoms  Then had bronchitis per the family    started on a Z-Pak  she went to theER 07/02/2016 Negative for flu 07/02/2016 Started on steroids, albterol Overall feeling better, still with some cough in the morning and the evening but slow improvement  Taking lasix daily, sometimes extra Lasix for shortness of breath   BMP:  CR 1.26, BUN 17, at that time was not drinking much legs are weak, chronic knee pain,  chronic back pain, worse recently as she has been sitting more per the daughter Previously seen by orthopedics  No significant leg edema  HA1C 5.8 Total chol 172, LDL 89  EKG shows normal sinus rhythm with rate 86 bpm T-wave abnormality 1 and aVL  Other past medical history reviewed she currently lives at home with her daughter , previously was living in [place] She was working with home health, PT. They noted low heart rate at home was recently evaluated in the hospital 07/02/2015 for heart rate in the 40s. Also was reported to have low blood pressure.  Echocardiogram in the hospital reviewed, normal ejection fraction, Mention of posterior pericardial effusion carvedilol was held, she had improvement of her heart rate , discharged with follow-up in clinic today   PMH:   has a past medical history of Anemia; Blood clot  associated with vein wall inflammation; CHF (congestive heart failure) ([place]); Chicken pox; Colon cancer ([place]); Coronary artery disease; DVT (deep venous thrombosis) ([place]); Frequent headaches; Gastrointestinal hemorrhage; GERD (gastroesophageal reflux disease); Hyperkalemia; Hypertension; Malignant neoplasm of stomach ([place]); PE (pulmonary embolism); Renal disorder; Renal insufficiency; and Vitamin D deficiency.  PSH:    Past Surgical History:  Procedure Laterality Date  . ABDOMINAL HYSTERECTOMY    . COLON SURGERY    . COLONOSCOPY WITH PROPOFOL N/A 08/23/2016   Procedure: COLONOSCOPY WITH PROPOFOL;  Surgeon: Jonathon Bellows, MD;  Location[facility] ENDOSCOPY;  Service: Gastroenterology;  Laterality: N/A;  . COLONOSCOPY WITH PROPOFOL N/A 08/24/2016   Procedure: COLONOSCOPY WITH PROPOFOL;  Surgeon: Jonathon Bellows, MD;  Location: [facility] ENDOSCOPY;  Service: Endoscopy;  Laterality: N/A;  . IVC FILTER PLACEMENT ([place] HX)    . JOINT REPLACEMENT Right    knee    Current Outpatient Prescriptions  Medication Sig Dispense Refill  . acetaminophen (TYLENOL) 650 MG CR tablet Take 650 mg by mouth 3 (three) times daily with meals.    Marland Kitchen amitriptyline (ELAVIL) 25 MG tablet Take 1 tablet (25 mg total) by mouth at bedtime. 30 tablet 4  . Cholecalciferol (VITAMIN D-3) 1000 UNITS CAPS Take 2 capsules by mouth daily.    . furosemide (LASIX) 40 MG tablet TAKE 1 TABLET (40 MG TOTAL) BY MOUTH 2 (TWO) TIMES DAILY AS NEEDED. 60 tablet 3  . Magnesium Oxide 500 MG TABS Take 1 tablet by mouth 2 (two) times daily.    Marland Kitchen  MEGARED OMEGA-3 KRILL OIL 500 MG CAPS Take by mouth.    Marland Kitchen omeprazole (PRILOSEC) 20 MG capsule Take 20 mg by mouth daily.    Marland Kitchen oxyCODONE-acetaminophen (PERCOCET) 10-325 MG tablet Take 1 tablet by mouth 2 (two) times daily as needed for pain. 60 tablet 0  . Polyethylene Glycol 3350 (MIRALAX PO) Take by mouth daily.    Marland Kitchen spironolactone (ALDACTONE) 25 MG tablet Take 1 tablet (25 mg total) by mouth 2 (two) times daily as needed.  180 tablet 3  . vitamin C (ASCORBIC ACID) 500 MG tablet Take 500 mg by mouth daily.     No current facility-administered medications for this visit.      Allergies:   Gabapentin; Reglan [metoclopramide]; Statins; and Torsemide   Social History:  The patient  reports that she has never smoked. She has never used smokeless tobacco. She reports that she does not drink alcohol or use drugs.   Family History:   family history includes Arthritis in her mother; Colon cancer in her sister and son; Heart disease in her daughter; Hypertension in her mother; Pancreatic cancer in her son.    Review of Systems: Review of Systems  Respiratory: Positive for cough.   Cardiovascular: Negative.   Gastrointestinal: Negative.   Musculoskeletal: Positive for back pain.       Leg weakness  Neurological: Positive for weakness.  Psychiatric/Behavioral: Negative.   All other systems reviewed and are negative.    PHYSICAL EXAM: VS:  There were no vitals taken for this visit. , BMI There is no height or weight on file to calculate BMI. GEN: Well nourished, well developed, in no acute distress , Obese, presenting a wheelchair HEENT: normal  Neck: no JVD, carotid bruits, or masses Cardiac: RRR; no murmurs, rubs, or gallops,no edema  Respiratory:  clear to auscultation bilaterally, unable to exclude small area scant rhonchi at left base , normal work of breathing GI: soft, nontender, nondistended, + BS MS: no deformity or atrophy  Skin: warm and dry, no rash Neuro:  Strength and sensation are intact Psych: euthymic mood, full affect    Recent Labs: 08/21/2016: ALT 8 08/22/2016: BUN 14; Creatinine, Ser 0.89; Hemoglobin 12.3; Platelets 203; Potassium 3.6; Sodium 139    Lipid Panel Lab Results  Component Value Date   CHOL 172 06/12/2015   HDL 52.60 06/12/2015   LDLCALC 89 06/12/2015   TRIG 155.0 (H) 06/12/2015      Wt Readings from Last 3 Encounters:  10/01/16 202 lb (91.6 kg)  09/16/16 206  lb 12 oz (93.8 kg)  08/21/16 205 lb 9.6 oz (93.3 kg)       ASSESSMENT AND PLAN:  Chronic congestive heart failure, unspecified congestive heart failure type ([place]) - Plan: EKG 12-Lead Shortly taking Lasix daily, daughter gives extra Lasix as needed for shortness of breath Potassium stable, creatinine high end of the range Discussed lab work with her in detail, we'll try to avoid hypovolemia  Essential hypertension - Plan: EKG 12-Lead Blood pressure is well controlled on today's visit. No changes m Bradycardia Asymptomatic, beta blocker held, rate improved  Acute bronchitis, unspecified organism Hospital records reviewed Symptoms resolved after steroids, albuterol, Z-Pak Occasional cough, appears to be getting better  At risk for falling Long discussion with her concerning her need to walk with her walker Setback after recent bronchitis, has been sedentary for at least 3 weeks, now very weak Presenting a wheelchair today  Chronic low back pain, unspecified back pain laterality, with sciatica presence unspecified  Recommended that she needed to get up and start moving, perhaps talk with primary care about home PT    Total encounter time more than 25 minutes  Greater than 50% was spent in counseling and coordination of care with the patient   Disposition:   F/U  6 months   No orders of the defined types were placed in this encounter.    Signed, Esmond Plants, M.D., Ph.D. 01/27/2017  [place], [place]

## 2017-01-28 ENCOUNTER — Ambulatory Visit: Payer: Medicare Other | Admitting: Cardiovascular Disease

## 2017-01-31 ENCOUNTER — Inpatient Hospital Stay: Payer: Medicare Other | Attending: Internal Medicine | Admitting: Internal Medicine

## 2017-01-31 ENCOUNTER — Inpatient Hospital Stay: Payer: Medicare Other

## 2017-01-31 VITALS — BP 139/85 | HR 65 | Temp 97.2°F | Resp 20 | Ht 63.0 in | Wt 202.4 lb

## 2017-01-31 DIAGNOSIS — C189 Malignant neoplasm of colon, unspecified: Secondary | ICD-10-CM

## 2017-01-31 DIAGNOSIS — C772 Secondary and unspecified malignant neoplasm of intra-abdominal lymph nodes: Principal | ICD-10-CM

## 2017-01-31 DIAGNOSIS — Z86711 Personal history of pulmonary embolism: Secondary | ICD-10-CM | POA: Diagnosis not present

## 2017-01-31 DIAGNOSIS — K4090 Unilateral inguinal hernia, without obstruction or gangrene, not specified as recurrent: Secondary | ICD-10-CM | POA: Insufficient documentation

## 2017-01-31 DIAGNOSIS — I110 Hypertensive heart disease with heart failure: Secondary | ICD-10-CM | POA: Insufficient documentation

## 2017-01-31 DIAGNOSIS — I2510 Atherosclerotic heart disease of native coronary artery without angina pectoris: Secondary | ICD-10-CM | POA: Insufficient documentation

## 2017-01-31 DIAGNOSIS — R109 Unspecified abdominal pain: Secondary | ICD-10-CM | POA: Diagnosis not present

## 2017-01-31 DIAGNOSIS — K219 Gastro-esophageal reflux disease without esophagitis: Secondary | ICD-10-CM

## 2017-01-31 DIAGNOSIS — G8929 Other chronic pain: Secondary | ICD-10-CM

## 2017-01-31 DIAGNOSIS — R938 Abnormal findings on diagnostic imaging of other specified body structures: Secondary | ICD-10-CM | POA: Diagnosis not present

## 2017-01-31 DIAGNOSIS — Z85038 Personal history of other malignant neoplasm of large intestine: Secondary | ICD-10-CM | POA: Diagnosis not present

## 2017-01-31 DIAGNOSIS — I509 Heart failure, unspecified: Secondary | ICD-10-CM | POA: Insufficient documentation

## 2017-01-31 DIAGNOSIS — E041 Nontoxic single thyroid nodule: Secondary | ICD-10-CM | POA: Diagnosis not present

## 2017-01-31 DIAGNOSIS — D649 Anemia, unspecified: Secondary | ICD-10-CM | POA: Insufficient documentation

## 2017-01-31 DIAGNOSIS — N281 Cyst of kidney, acquired: Secondary | ICD-10-CM | POA: Diagnosis not present

## 2017-01-31 DIAGNOSIS — K5730 Diverticulosis of large intestine without perforation or abscess without bleeding: Secondary | ICD-10-CM | POA: Diagnosis not present

## 2017-01-31 DIAGNOSIS — K7689 Other specified diseases of liver: Secondary | ICD-10-CM | POA: Diagnosis not present

## 2017-01-31 DIAGNOSIS — Z79899 Other long term (current) drug therapy: Secondary | ICD-10-CM | POA: Diagnosis not present

## 2017-01-31 DIAGNOSIS — E559 Vitamin D deficiency, unspecified: Secondary | ICD-10-CM | POA: Insufficient documentation

## 2017-01-31 DIAGNOSIS — Z800 Family history of malignant neoplasm of digestive organs: Secondary | ICD-10-CM | POA: Diagnosis not present

## 2017-01-31 LAB — CBC WITH DIFFERENTIAL/PLATELET
Basophils Absolute: 0.1 10*3/uL (ref 0–0.1)
Basophils Relative: 1 %
EOS ABS: 0.1 10*3/uL (ref 0–0.7)
EOS PCT: 2 %
HCT: 38.7 % (ref 35.0–47.0)
HEMOGLOBIN: 13 g/dL (ref 12.0–16.0)
LYMPHS ABS: 2.8 10*3/uL (ref 1.0–3.6)
Lymphocytes Relative: 47 %
MCH: 29.8 pg (ref 26.0–34.0)
MCHC: 33.5 g/dL (ref 32.0–36.0)
MCV: 88.9 fL (ref 80.0–100.0)
MONOS PCT: 8 %
Monocytes Absolute: 0.5 10*3/uL (ref 0.2–0.9)
NEUTROS PCT: 42 %
Neutro Abs: 2.4 10*3/uL (ref 1.4–6.5)
Platelets: 225 10*3/uL (ref 150–440)
RBC: 4.35 MIL/uL (ref 3.80–5.20)
RDW: 14.3 % (ref 11.5–14.5)
WBC: 5.8 10*3/uL (ref 3.6–11.0)

## 2017-01-31 LAB — COMPREHENSIVE METABOLIC PANEL
ALK PHOS: 72 U/L (ref 38–126)
ALT: 9 U/L — AB (ref 14–54)
ANION GAP: 10 (ref 5–15)
AST: 17 U/L (ref 15–41)
Albumin: 4.1 g/dL (ref 3.5–5.0)
BILIRUBIN TOTAL: 0.5 mg/dL (ref 0.3–1.2)
BUN: 23 mg/dL — ABNORMAL HIGH (ref 6–20)
CALCIUM: 9.6 mg/dL (ref 8.9–10.3)
CO2: 28 mmol/L (ref 22–32)
CREATININE: 1.22 mg/dL — AB (ref 0.44–1.00)
Chloride: 103 mmol/L (ref 101–111)
GFR, EST AFRICAN AMERICAN: 45 mL/min — AB (ref >60)
GFR, EST NON AFRICAN AMERICAN: 39 mL/min — AB (ref >60)
Glucose, Bld: 104 mg/dL — ABNORMAL HIGH (ref 65–99)
Potassium: 4.2 mmol/L (ref 3.5–5.1)
SODIUM: 141 mmol/L (ref 135–145)
TOTAL PROTEIN: 7.1 g/dL (ref 6.5–8.1)

## 2017-01-31 NOTE — Assessment & Plan Note (Addendum)
#   Colon cancer-stage III [pT4N1a] with multiple liver cysts on the CT scan]. No adjuvant therapy given multiple comorbidities for performance status. CT scan- Feb 2018- NED; colonoscopy- recent negative. Most recent CEA April 2018-20. CEA from today pending.  # Abdominal pain- unclear etiology. Incisional question neuropathic.poor tolerance to Neurontin/  Amitriptyline. Currently stable on oxycodone 2 PCP. Continue the same.  #Endometrial thickening- unable to do biopsy; secondary to cervical stenosis [Dr.Schermerhorn]  # Left thyroid nodule- question malignancy; monitor for now given comorbidities at this time.  # Follow up in appx 4 months/ no labs; [daughter- [phone]- will call about CEA;  if higher would recommend repeat imaging].

## 2017-01-31 NOTE — Progress Notes (Signed)
[facility] OFFICE PROGRESS NOTE  Patient Care Team: Jearld Fenton, NP as PCP - General (Internal Medicine)   SUMMARY OF ONCOLOGIC HISTORY:  Oncology History   # July 2016- COLON CANCER; mod diff adeno; STAGE III (pT4aN1a; MSI-STABLE) [s/p surgery;Dr. Arbutus [place]];No adjuvant chemo sec to co-morbdities DEC 2016- CT-C/ A/P [[facility]]- subcentimeter hepatic cysts ; irregular soft tissue density in the peritoneal fat within pelvis ? Scarring  [less likely recurrence];   # DEC 2017- CT-NED [but CEA rising ~20]  # July R LE DVT/Bil PE s/p IVC filter; Hx of GIB; AKI; Hx of Severe Anemia; hx CHF     Colon cancer metastasized to intra-abdominal lymph node ([place])     INTERVAL HISTORY: Patient is a poor historian. Daughter accompanying the patient.  83 year old female patient with above history of stage III with multiple comorbidities- unable to tolerate adjuvant chemotherapy; And history of rising CEA [February 2017 up to 20] is here for follow-up.  In the interim patient was evaluated by gynecology-for endometrial thickening. Biopsy not done because of cervical stenosis.  Patient has not had any further hospitalizations. She continues to have chronic abdominal pain for which she is on oxycodone. Patient stopped taking Neurontin /amitriptyline because of intolerance. Denies any nausea vomiting or constipation.   REVIEW OF SYSTEMS:  A complete 10 point review of system is done which is negative except mentioned above/history of present illness.   PAST MEDICAL HISTORY :  Past Medical History:  Diagnosis Date  . Anemia   . Blood clot associated with vein wall inflammation   . CHF (congestive heart failure) ([place])   . Chicken pox   . Colon cancer ([place])   . Coronary artery disease   . DVT (deep venous thrombosis) ([place])   . Frequent headaches   . Gastrointestinal hemorrhage   . GERD (gastroesophageal reflux disease)   . Hyperkalemia   . Hypertension   . Malignant  neoplasm of stomach ([place])   . PE (pulmonary embolism)   . Renal disorder   . Renal insufficiency   . Vitamin D deficiency     PAST SURGICAL HISTORY :   Past Surgical History:  Procedure Laterality Date  . ABDOMINAL HYSTERECTOMY    . COLON SURGERY    . COLONOSCOPY WITH PROPOFOL N/A 08/23/2016   Procedure: COLONOSCOPY WITH PROPOFOL;  Surgeon: Jonathon Bellows, MD;  Location: [facility] ENDOSCOPY;  Service: Gastroenterology;  Laterality: N/A;  . COLONOSCOPY WITH PROPOFOL N/A 08/24/2016   Procedure: COLONOSCOPY WITH PROPOFOL;  Surgeon: Jonathon Bellows, MD;  Location: [facility] ENDOSCOPY;  Service: Endoscopy;  Laterality: N/A;  . IVC FILTER PLACEMENT ([place] HX)    . JOINT REPLACEMENT Right    knee    FAMILY HISTORY :   Family History  Problem Relation Age of Onset  . Arthritis Mother   . Hypertension Mother   . Colon cancer Sister   . Heart disease Daughter   . Colon cancer Son   . Pancreatic cancer Son     SOCIAL HISTORY:   Social History  Substance Use Topics  . Smoking status: Never Smoker  . Smokeless tobacco: Never Used  . Alcohol use No    ALLERGIES:  is allergic to gabapentin; reglan [metoclopramide]; statins; and torsemide.  MEDICATIONS:  Current Outpatient Prescriptions  Medication Sig Dispense Refill  . Cholecalciferol (VITAMIN D-3) 1000 UNITS CAPS Take 2 capsules by mouth daily.    . furosemide (LASIX) 40 MG tablet TAKE 1 TABLET (40 MG TOTAL) BY MOUTH 2 (  TWO) TIMES DAILY AS NEEDED. 60 tablet 3  . Magnesium Oxide 500 MG TABS Take 1 tablet by mouth 2 (two) times daily.    Marland Kitchen MEGARED OMEGA-3 KRILL OIL 500 MG CAPS Take by mouth.    Marland Kitchen omeprazole (PRILOSEC) 20 MG capsule Take 20 mg by mouth daily.    Marland Kitchen oxyCODONE-acetaminophen (PERCOCET) 10-325 MG tablet Take 1 tablet by mouth 2 (two) times daily as needed for pain. 60 tablet 0  . Polyethylene Glycol 3350 (MIRALAX PO) Take by mouth daily.    Marland Kitchen spironolactone (ALDACTONE) 25 MG tablet Take 1 tablet (25 mg total) by mouth 2 (two) times daily as  needed. 180 tablet 3  . vitamin C (ASCORBIC ACID) 500 MG tablet Take 500 mg by mouth daily.     No current facility-administered medications for this visit.     PHYSICAL EXAMINATION: ECOG PERFORMANCE STATUS: 3 - Symptomatic, >50% confined to bed  BP 139/85 (Patient Position: Sitting)   Pulse 65   Temp (!) 97.2 F (36.2 C) (Tympanic)   Resp 20   Ht _0  (1.6 m)   Wt 202 lb 6.4 oz (91.8 kg)   BMI 35.85 kg/m   Filed Weights   01/31/17 1444  Weight: 202 lb 6.4 oz (91.8 kg)    Accompanied by her daughter.  ENERAL: Well-nourished well-developed; Alert, no distress and comfortable. In wheel chair.  EYES: no pallor or icterus OROPHARYNX: no thrush or ulceration; good dentition  NECK: supple, no masses felt LYMPH: no palpable lymphadenopathy in the cervical, axillary or inguinal regions LUNGS: clear to auscultation and No wheeze or crackles HEART/CVS: regular rate & rhythm and no murmurs; bilateral lower extremity swelling. ABDOMEN: abdomen soft, non-tender and normal bowel sounds Musculoskeletal:no cyanosis of digits and no clubbing  PSYCH: alert & oriented x 3 with fluent speech NEURO: no focal motor/sensory deficits SKIN: no rashes or significant lesions   LABORATORY DATA:  I have reviewed the data as listed    Component Value Date/Time   NA 141 01/31/2017 1408   NA 141 09/11/2015 0920   K 4.2 01/31/2017 1408   CL 103 01/31/2017 1408   CO2 28 01/31/2017 1408   GLUCOSE 104 (H) 01/31/2017 1408   BUN 23 (H) 01/31/2017 1408   BUN 25 09/11/2015 0920   CREATININE 1.22 (H) 01/31/2017 1408   CALCIUM 9.6 01/31/2017 1408   PROT 7.1 01/31/2017 1408   ALBUMIN 4.1 01/31/2017 1408   AST 17 01/31/2017 1408   ALT 9 (L) 01/31/2017 1408   ALKPHOS 72 01/31/2017 1408   BILITOT 0.5 01/31/2017 1408   GFRNONAA 39 (L) 01/31/2017 1408   GFRAA 45 (L) 01/31/2017 1408    No results found for: SPEP, UPEP  Lab Results  Component Value Date   WBC 5.8 01/31/2017   NEUTROABS 2.4  01/31/2017   HGB 13.0 01/31/2017   HCT 38.7 01/31/2017   MCV 88.9 01/31/2017   PLT 225 01/31/2017      Chemistry      Component Value Date/Time   NA 141 01/31/2017 1408   NA 141 09/11/2015 0920   K 4.2 01/31/2017 1408   CL 103 01/31/2017 1408   CO2 28 01/31/2017 1408   BUN 23 (H) 01/31/2017 1408   BUN 25 09/11/2015 0920   CREATININE 1.22 (H) 01/31/2017 1408      Component Value Date/Time   CALCIUM 9.6 01/31/2017 1408   ALKPHOS 72 01/31/2017 1408   AST 17 01/31/2017 1408   ALT 9 (L) 01/31/2017 1408  BILITOT 0.5 01/31/2017 1408     IMPRESSION: No acute findings in the abdomen/pelvis.  Moderate colonic diverticulosis without active inflammation.  Stable hepatic cysts.  Stable right renal cyst.  Mild cardiomegaly. Atherosclerotic coronary artery disease. Aortic atherosclerosis.  Stable appearance of the pancreas with prominence of the main pancreatic duct and several scattered small cystic lesions.  Small left inguinal hernia containing only peritoneal fat.   Electronically Signed   By: Marin Olp M.D.   On: 08/21/2016 17:15  ASSESSMENT & PLAN:   Colon cancer metastasized to intra-abdominal lymph node ([place]) # Colon cancer-stage III [pT4N1a] with multiple liver cysts on the CT scan]. No adjuvant therapy given multiple comorbidities for performance status. CT scan- Feb 2018- NED; colonoscopy- recent negative. Most recent CEA April 2018-20. CEA from today pending.  # Abdominal pain- unclear etiology. Incisional question neuropathic.poor tolerance to Neurontin/  Amitriptyline. Currently stable on oxycodone 2 PCP. Continue the same.  #Endometrial thickening- unable to do biopsy; secondary to cervical stenosis [Dr.Schermerhorn]  # Left thyroid nodule- question malignancy; monitor for now given comorbidities at this time.  # Follow up in appx 4 months/ no labs; [daughter- [phone]- will call about CEA;  if higher would recommend repeat imaging].       Cammie Sickle, MD 01/31/2017 5:36 PM

## 2017-02-01 LAB — CEA: CEA1: 36.2 ng/mL — AB (ref 0.0–4.7)

## 2017-02-02 ENCOUNTER — Telehealth: Payer: Self-pay | Admitting: Internal Medicine

## 2017-02-02 DIAGNOSIS — C189 Malignant neoplasm of colon, unspecified: Secondary | ICD-10-CM

## 2017-02-02 DIAGNOSIS — C772 Secondary and unspecified malignant neoplasm of intra-abdominal lymph nodes: Principal | ICD-10-CM

## 2017-02-02 NOTE — Telephone Encounter (Signed)
Spoke to patient's daughter regarding elevated CEA/rising at 17. Plan to get a PET scan; please schedule ASAP; follow-up with me 1 to 3 days post PET scan.

## 2017-02-03 NOTE — Telephone Encounter (Signed)
msg sent to scheduling team. 

## 2017-02-08 ENCOUNTER — Ambulatory Visit
Admission: RE | Admit: 2017-02-08 | Discharge: 2017-02-08 | Disposition: A | Discharge status: Home / Self Care | Payer: Medicare Other | Source: Ambulatory Visit | Attending: Internal Medicine | Admitting: Internal Medicine

## 2017-02-08 DIAGNOSIS — C772 Secondary and unspecified malignant neoplasm of intra-abdominal lymph nodes: Secondary | ICD-10-CM | POA: Diagnosis not present

## 2017-02-08 DIAGNOSIS — D259 Leiomyoma of uterus, unspecified: Secondary | ICD-10-CM | POA: Diagnosis not present

## 2017-02-08 DIAGNOSIS — R918 Other nonspecific abnormal finding of lung field: Secondary | ICD-10-CM | POA: Diagnosis not present

## 2017-02-08 DIAGNOSIS — I700 Atherosclerosis of aorta: Secondary | ICD-10-CM | POA: Diagnosis not present

## 2017-02-08 DIAGNOSIS — E041 Nontoxic single thyroid nodule: Secondary | ICD-10-CM | POA: Diagnosis not present

## 2017-02-08 DIAGNOSIS — C189 Malignant neoplasm of colon, unspecified: Secondary | ICD-10-CM | POA: Diagnosis not present

## 2017-02-08 DIAGNOSIS — I2510 Atherosclerotic heart disease of native coronary artery without angina pectoris: Secondary | ICD-10-CM | POA: Diagnosis not present

## 2017-02-08 DIAGNOSIS — E042 Nontoxic multinodular goiter: Secondary | ICD-10-CM | POA: Insufficient documentation

## 2017-02-08 DIAGNOSIS — K7689 Other specified diseases of liver: Secondary | ICD-10-CM | POA: Insufficient documentation

## 2017-02-08 LAB — GLUCOSE, CAPILLARY: GLUCOSE-CAPILLARY: 105 mg/dL — AB (ref 65–99)

## 2017-02-08 MED ORDER — FLUDEOXYGLUCOSE F - 18 (FDG) INJECTION
12.0000 | Freq: Once | INTRAVENOUS | Status: AC | PRN
  Administered 2017-02-08: 12.304 via INTRAVENOUS

## 2017-02-15 ENCOUNTER — Telehealth: Payer: Self-pay | Admitting: *Deleted

## 2017-02-15 ENCOUNTER — Telehealth: Payer: Self-pay | Admitting: Internal Medicine

## 2017-02-15 NOTE — Telephone Encounter (Signed)
md made aware- he will call daughter

## 2017-02-15 NOTE — Telephone Encounter (Signed)
Daughter [name] called for results of PET Scan   IMPRESSION: 1. Hypermetabolic left thyroid nodule. As noted on the prior exam, a significant minority of such hypermetabolic thyroid nodules can be malignant. If not previously worked up, thyroid ultrasound would be recommended as the next step. 2. No findings of metastatic colon cancer. 3. Hepatic cysts. 4. Uterine fibroids. Low-density prominence of the endometrium or endometrial contents for age, but without hypermetabolic activity to suggest malignancy. 5. Aortic Atherosclerosis (ICD10-I70.0). Coronary atherosclerosis. Calcification of the aortic valve leaflets. 6. Right basilar scarring/chronic atelectasis with mild elevation of right hemidiaphragm.   Electronically Signed   By: Van Clines M.D.   On: 02/08/2017 13:57  Please return her call [phone]

## 2017-02-15 NOTE — Telephone Encounter (Signed)
Discussed with the patient's daughter- Jill Bowers the results of the August 2018 PET scan- which does not show any overwhelming evidence of recurrence. However CEA trending up from 20 to 36.   # Discussed options- left thyroid nodule biopsy # Endometrial biopsy [previously considered-unable to do it]- Dr.schermerhorn. # Occult colon malignancy; however colonoscopy 2018 negative.  After the lengthy discussion it was decided not to put patient to further biopsies. However would want to follow tumor markers; and imaging if needed.   -----------------------------------------------------------------------------------------------------------------------    Jill Bowers- please have the labs done one week prior to next visit in December; so that we will have the CEA available/to discuss at next visit. Please inform pt/daughter. Thx

## 2017-02-15 NOTE — Telephone Encounter (Signed)
msg sent to sch. Team to arrange for lab apt 1 week prior to md apt in Dec.

## 2017-03-06 NOTE — Progress Notes (Signed)
Cardiology Office Note  Date:  03/07/2017   ID:  Jill Bowers, DOB 1929/06/17, MRN [id number]  PCP:  Jearld Fenton, NP   Chief Complaint  Patient presents with  . other    6 month follow up. Pt. c/o chest pain & shortness of breath with over exertion. Meds reviewed by the pt. verbally.     HPI:  [name] is a pleasant 83 year old woman with history of  bradycardia,  hospitalized at [facility] for bradycardia,  DVT, PE, IVC filter in place,  prior rectal bleeding, not on anticoagulation anemia,  colon cancer, followed by oncology, stage III , resection 4496 Chronic diastolic CHF presents today for follow-up of her bradycardia, shortness of breath  In follow-up today she presents with her daughter CEA elevated Pet scan reviewed with her in detail, elevated right hemipiaphragm CAD, aortic athero  She has chronic shortness of breath on exertion No leg swelling, no PND or orthopnea Takes Lasix daily. Has not required significant extra Denies any chest pain  No regular exercise program legs are weak, chronic knee pain,  chronic back pain, worse recently as she has been sitting more per the daughter Previously seen by orthopedics  HA1C 5.8 Total chol 172, LDL 89  EKG shows normal sinus rhythm with rate 61 bpm T-wave abnormality 1 and aVL  Other past medical history reviewed In hospital 07/2016 Rectal bleeding, Diverticulosis of large intestine with hemorrhage  she currently lives at home with her daughter , previously was living in [place] She was working with home health, PT. They noted low heart rate at home was recently evaluated in the hospital 07/02/2015 for heart rate in the 40s. Also was reported to have low blood pressure.  Echocardiogram in the hospital reviewed, normal ejection fraction, Mention of posterior pericardial effusion carvedilol was held, she had improvement of her heart rate , discharged with follow-up in clinic today   PMH:    has a past medical history of Anemia; Blood clot associated with vein wall inflammation; CHF (congestive heart failure) ([place]); Chicken pox; Colon cancer ([place]); Coronary artery disease; DVT (deep venous thrombosis) ([place]); Frequent headaches; Gastrointestinal hemorrhage; GERD (gastroesophageal reflux disease); Hyperkalemia; Hypertension; Malignant neoplasm of stomach ([place]); PE (pulmonary embolism); Renal disorder; Renal insufficiency; and Vitamin D deficiency.  PSH:    Past Surgical History:  Procedure Laterality Date  . ABDOMINAL HYSTERECTOMY    . COLON SURGERY    . COLONOSCOPY WITH PROPOFOL N/A 08/23/2016   Procedure: COLONOSCOPY WITH PROPOFOL;  Surgeon: Jonathon Bellows, MD;  Location: [facility] ENDOSCOPY;  Service: Gastroenterology;  Laterality: N/A;  . COLONOSCOPY WITH PROPOFOL N/A 08/24/2016   Procedure: COLONOSCOPY WITH PROPOFOL;  Surgeon: Jonathon Bellows, MD;  Location: [facility] ENDOSCOPY;  Service: Endoscopy;  Laterality: N/A;  . IVC FILTER PLACEMENT ([place] HX)    . JOINT REPLACEMENT Right    knee    Current Outpatient Prescriptions  Medication Sig Dispense Refill  . Cholecalciferol (VITAMIN D-3) 1000 UNITS CAPS Take 2 capsules by mouth daily.    . furosemide (LASIX) 40 MG tablet TAKE 1 TABLET (40 MG TOTAL) BY MOUTH 2 (TWO) TIMES DAILY AS NEEDED. 60 tablet 3  . Magnesium Oxide 500 MG TABS Take 1 tablet by mouth 2 (two) times daily.    Marland Kitchen MEGARED OMEGA-3 KRILL OIL 500 MG CAPS Take by mouth.    Marland Kitchen omeprazole (PRILOSEC) 20 MG capsule Take 20 mg by mouth daily.    Marland Kitchen oxyCODONE-acetaminophen (PERCOCET) 10-325 MG tablet Take 1 tablet by mouth every  8 (eight) hours as needed for pain. 90 tablet 0  . Polyethylene Glycol 3350 (MIRALAX PO) Take by mouth daily.    Marland Kitchen spironolactone (ALDACTONE) 25 MG tablet Take 1 tablet (25 mg total) by mouth 2 (two) times daily as needed. 180 tablet 3  . vitamin C (ASCORBIC ACID) 500 MG tablet Take 500 mg by mouth daily.     No current facility-administered medications for this visit.       Allergies:   Gabapentin; Reglan [metoclopramide]; Statins; and Torsemide   Social History:  The patient  reports that she has never smoked. She has never used smokeless tobacco. She reports that she does not drink alcohol or use drugs.   Family History:   family history includes Arthritis in her mother; Colon cancer in her sister and son; Heart disease in her daughter; Hypertension in her mother; Pancreatic cancer in her son.    Review of Systems: Review of Systems  Respiratory: Negative.   Cardiovascular: Negative.   Gastrointestinal: Negative.   Musculoskeletal: Negative.        Leg weakness  Neurological: Positive for weakness.  Psychiatric/Behavioral: Negative.   All other systems reviewed and are negative.    PHYSICAL EXAM: VS:  BP 130/64 (BP Location: Left Arm, Patient Position: Sitting, Cuff Size: Normal)   Pulse 61   Ht 5\' 3"  (1.6 m)   Wt 206 lb 8 oz (93.7 kg)   BMI 36.58 kg/m  , BMI Body mass index is 36.58 kg/m. GEN: Well nourished, well developed, in no acute distress , Obese, presenting a wheelchair HEENT: normal  Neck: no JVD, carotid bruits, or masses Cardiac: RRR; no murmurs, rubs, or gallops,no edema  Respiratory:  clear to auscultation bilaterally, unable to exclude small area scant rhonchi at left base , normal work of breathing GI: soft, nontender, nondistended, + BS MS: no deformity or atrophy  Skin: warm and dry, no rash Neuro:  Strength and sensation are intact Psych: euthymic mood, full affect    Recent Labs: 01/31/2017: ALT 9; BUN 23; Creatinine, Ser 1.22; Hemoglobin 13.0; Platelets 225; Potassium 4.2; Sodium 141    Lipid Panel Lab Results  Component Value Date   CHOL 172 06/12/2015   HDL 52.60 06/12/2015   LDLCALC 89 06/12/2015   TRIG 155.0 (H) 06/12/2015      Wt Readings from Last 3 Encounters:  03/07/17 206 lb 8 oz (93.7 kg)  03/07/17 206 lb (93.4 kg)  01/31/17 202 lb 6.4 oz (91.8 kg)       ASSESSMENT AND  PLAN:  Chronic congestive heart failure, unspecified congestive heart failure type ([place]) - Plan: EKG 12-Lead taking Lasix daily,  BMP reviewed, stable Appears relatively euvolemic  Essential hypertension - Plan: EKG 12-Lead Blood pressure is well controlled on today's visit. No changes made to the medications.  At risk for falling Long discussion with her concerning her need to walk with her walker Setback after recent bronchitis, has been sedentary for at least 3 weeks, now very weak Presenting a wheelchair today  Chronic low back pain, unspecified back pain laterality, with sciatica presence unspecified Stable, presents in wheel chair   Total encounter time more than 25 minutes  Greater than 50% was spent in counseling and coordination of care with the patient  Disposition:   F/U  12 months   No orders of the defined types were placed in this encounter.    Signed, Esmond Plants, M.D., Ph.D. 03/07/2017  [place], [place]

## 2017-03-07 ENCOUNTER — Encounter: Payer: Self-pay | Admitting: Internal Medicine

## 2017-03-07 ENCOUNTER — Ambulatory Visit (INDEPENDENT_AMBULATORY_CARE_PROVIDER_SITE_OTHER): Payer: Medicare Other | Admitting: Cardiovascular Disease

## 2017-03-07 ENCOUNTER — Ambulatory Visit (INDEPENDENT_AMBULATORY_CARE_PROVIDER_SITE_OTHER): Payer: Medicare Other | Admitting: Internal Medicine

## 2017-03-07 ENCOUNTER — Encounter: Payer: Self-pay | Admitting: Cardiovascular Disease

## 2017-03-07 VITALS — BP 126/72 | HR 68 | Temp 98.6°F | Wt 206.0 lb

## 2017-03-07 VITALS — BP 130/64 | HR 61 | Ht 63.0 in | Wt 206.5 lb

## 2017-03-07 DIAGNOSIS — Z86711 Personal history of pulmonary embolism: Secondary | ICD-10-CM

## 2017-03-07 DIAGNOSIS — C189 Malignant neoplasm of colon, unspecified: Secondary | ICD-10-CM | POA: Diagnosis not present

## 2017-03-07 DIAGNOSIS — R001 Bradycardia, unspecified: Secondary | ICD-10-CM

## 2017-03-07 DIAGNOSIS — I5032 Chronic diastolic (congestive) heart failure: Secondary | ICD-10-CM

## 2017-03-07 DIAGNOSIS — M545 Low back pain: Secondary | ICD-10-CM

## 2017-03-07 DIAGNOSIS — I10 Essential (primary) hypertension: Secondary | ICD-10-CM

## 2017-03-07 DIAGNOSIS — Z23 Encounter for immunization: Secondary | ICD-10-CM

## 2017-03-07 DIAGNOSIS — Z7409 Other reduced mobility: Secondary | ICD-10-CM

## 2017-03-07 DIAGNOSIS — R6889 Other general symptoms and signs: Secondary | ICD-10-CM | POA: Diagnosis not present

## 2017-03-07 DIAGNOSIS — C772 Secondary and unspecified malignant neoplasm of intra-abdominal lymph nodes: Secondary | ICD-10-CM | POA: Diagnosis not present

## 2017-03-07 DIAGNOSIS — I739 Peripheral vascular disease, unspecified: Secondary | ICD-10-CM | POA: Diagnosis not present

## 2017-03-07 DIAGNOSIS — G8929 Other chronic pain: Secondary | ICD-10-CM

## 2017-03-07 MED ORDER — OXYCODONE-ACETAMINOPHEN 10-325 MG PO TABS: 1.0000 | ORAL_TABLET | Freq: Three times a day (TID) | ORAL | 0 refills | Status: DC | PRN

## 2017-03-07 NOTE — Addendum Note (Signed)
Addended by: Lurlean Nanny on: 03/07/2017 03:12 PM   Modules accepted: Orders

## 2017-03-07 NOTE — Patient Instructions (Signed)

## 2017-03-07 NOTE — Patient Instructions (Signed)

## 2017-03-07 NOTE — Progress Notes (Signed)
Subjective:    Patient ID: Jill Bowers, female    DOB: 10-Apr-1929, 83 y.o.   MRN: [id number]  HPI  Pt presents to the clinic today requesting an order for a hospital bed. She reports her old hospital bed has a manual hand crank in order to elevate the head of the bed. She is not strong enough to move it by herself. She is in need of a electric hospital bed because she is mainly bed bound due to chronic back pain. She does not ambulate due to safety. She can stand to transfer with assistance. She needs frequent repositioning and needs her HOB elevated> 30 degrees.  She also would like a refill of her Percocet. She does not feel like it holds her as long as it used to. She is having more back pain. Her colon cancer has spread to her abdomen and thyroid.  Review of Systems      Past Medical History:  Diagnosis Date  . Anemia   . Blood clot associated with vein wall inflammation   . CHF (congestive heart failure) ([place])   . Chicken pox   . Colon cancer ([place])   . Coronary artery disease   . DVT (deep venous thrombosis) ([place])   . Frequent headaches   . Gastrointestinal hemorrhage   . GERD (gastroesophageal reflux disease)   . Hyperkalemia   . Hypertension   . Malignant neoplasm of stomach ([place])   . PE (pulmonary embolism)   . Renal disorder   . Renal insufficiency   . Vitamin D deficiency     Current Outpatient Prescriptions  Medication Sig Dispense Refill  . Cholecalciferol (VITAMIN D-3) 1000 UNITS CAPS Take 2 capsules by mouth daily.    . furosemide (LASIX) 40 MG tablet TAKE 1 TABLET (40 MG TOTAL) BY MOUTH 2 (TWO) TIMES DAILY AS NEEDED. 60 tablet 3  . Magnesium Oxide 500 MG TABS Take 1 tablet by mouth 2 (two) times daily.    Marland Kitchen MEGARED OMEGA-3 KRILL OIL 500 MG CAPS Take by mouth.    Marland Kitchen omeprazole (PRILOSEC) 20 MG capsule Take 20 mg by mouth daily.    Marland Kitchen oxyCODONE-acetaminophen (PERCOCET) 10-325 MG tablet Take 1 tablet by mouth 2 (two) times daily as needed for pain. 60 tablet 0    . Polyethylene Glycol 3350 (MIRALAX PO) Take by mouth daily.    Marland Kitchen spironolactone (ALDACTONE) 25 MG tablet Take 1 tablet (25 mg total) by mouth 2 (two) times daily as needed. 180 tablet 3  . vitamin C (ASCORBIC ACID) 500 MG tablet Take 500 mg by mouth daily.     No current facility-administered medications for this visit.     Allergies  Allergen Reactions  . Gabapentin Other (See Comments)    Nightmares   . Reglan [Metoclopramide] Other (See Comments)  . Statins Other (See Comments)  . Torsemide Other (See Comments)    Family History  Problem Relation Age of Onset  . Arthritis Mother   . Hypertension Mother   . Colon cancer Sister   . Heart disease Daughter   . Colon cancer Son   . Pancreatic cancer Son     Social History   Social History  . Marital status: Widowed    Spouse name: N/A  . Number of children: N/A  . Years of education: N/A   Occupational History  . Not on file.   Social History Main Topics  . Smoking status: Never Smoker  . Smokeless tobacco: Never Used  . Alcohol  use No  . Drug use: No  . Sexual activity: No   Other Topics Concern  . Not on file   Social History Narrative   Lives at home with daughter, has a walker     Constitutional: Pt reports fatigue. Denies fever, malaise, headache or abrupt weight changes.  Respiratory: Denies difficulty breathing, shortness of breath, cough or sputum production.   Cardiovascular: Denies chest pain, chest tightness, palpitations or swelling in the hands or feet.  Musculoskeletal: Pt reports back pain.    No other specific complaints in a complete review of systems (except as listed in HPI above).  Objective:   Physical Exam  BP 126/72   Pulse 68   Temp 98.6 F (37 C) (Oral)   Wt 206 lb (93.4 kg)   SpO2 98%   BMI 36.49 kg/m  Wt Readings from Last 3 Encounters:  03/07/17 206 lb (93.4 kg)  01/31/17 202 lb 6.4 oz (91.8 kg)  10/01/16 202 lb (91.6 kg)    General: Appears her stated age,  obese in NAD. Musculoskeletal: Unable to get her out of the wheelchair to exam her.  Neurological: Alert, oriented to person.   BMET    Component Value Date/Time   NA 141 01/31/2017 1408   NA 141 09/11/2015 0920   K 4.2 01/31/2017 1408   CL 103 01/31/2017 1408   CO2 28 01/31/2017 1408   GLUCOSE 104 (H) 01/31/2017 1408   BUN 23 (H) 01/31/2017 1408   BUN 25 09/11/2015 0920   CREATININE 1.22 (H) 01/31/2017 1408   CALCIUM 9.6 01/31/2017 1408   GFRNONAA 39 (L) 01/31/2017 1408   GFRAA 45 (L) 01/31/2017 1408    Lipid Panel     Component Value Date/Time   CHOL 172 06/12/2015 1125   TRIG 155.0 (H) 06/12/2015 1125   HDL 52.60 06/12/2015 1125   CHOLHDL 3 06/12/2015 1125   VLDL 31.0 06/12/2015 1125   LDLCALC 89 06/12/2015 1125    CBC    Component Value Date/Time   WBC 5.8 01/31/2017 1408   RBC 4.35 01/31/2017 1408   HGB 13.0 01/31/2017 1408   HCT 38.7 01/31/2017 1408   PLT 225 01/31/2017 1408   MCV 88.9 01/31/2017 1408   MCH 29.8 01/31/2017 1408   MCHC 33.5 01/31/2017 1408   RDW 14.3 01/31/2017 1408   LYMPHSABS 2.8 01/31/2017 1408   MONOABS 0.5 01/31/2017 1408   EOSABS 0.1 01/31/2017 1408   BASOSABS 0.1 01/31/2017 1408    Hgb A1C Lab Results  Component Value Date   HGBA1C 5.8 06/12/2015            Assessment & Plan:   Chronic Back Pain, Impaired Ambulation:  RX for semi electric hospital bed with gel overlay Increase Percocet to 10-325 mg TID prn, refilled today  Return precautions discussed Webb Silversmith, NP

## 2017-05-10 ENCOUNTER — Other Ambulatory Visit: Payer: Self-pay | Admitting: Internal Medicine

## 2017-05-10 NOTE — Telephone Encounter (Signed)
Last Rx and OV 03/07/2017.

## 2017-05-10 NOTE — Telephone Encounter (Signed)
Request for controlled medication. Thanks.

## 2017-05-10 NOTE — Telephone Encounter (Signed)
Copied from [place]. Topic: Quick Communication - See Telephone Encounter >> May 10, 2017 11:49 AM Bea Graff, NT wrote: CRM for notification. See Telephone encounter for: Patients daughter calling requesting a refill of moms oxycodone. [facility] [address] is her pharmacy.   05/10/17.

## 2017-05-10 NOTE — Telephone Encounter (Signed)
Call pt:  Advise daughter of [employer] Stop Act. Ask her if she would be able to bring pt in every 3 months for pain prescriptions. If so, we will go ahead and refill. If not, we should consider switching to something like Tramadol

## 2017-05-11 MED ORDER — OXYCODONE-ACETAMINOPHEN 10-325 MG PO TABS: 1.0000 | ORAL_TABLET | Freq: Three times a day (TID) | ORAL | 0 refills | Status: DC | PRN

## 2017-05-11 NOTE — Telephone Encounter (Signed)
Rx left in front office for pick up and pt's daughter is aware

## 2017-05-11 NOTE — Telephone Encounter (Signed)
I spoke to daughter and she reports pt was on the Tramadol before changing to Percocet and she had no relief. Jill Bowers states she is okay with coming every 3 months for a follow up appt and expressed understanding... I let her know Rx will be ready after 3pm today... She will schedule appt when pt gets low on meds as she does not get Rx every 30 days

## 2017-05-11 NOTE — Telephone Encounter (Signed)
RX printed and signed and placed in MYD box 

## 2017-05-28 ENCOUNTER — Other Ambulatory Visit: Payer: Self-pay | Admitting: Cardiovascular Disease

## 2017-05-30 ENCOUNTER — Other Ambulatory Visit: Payer: Medicare Other

## 2017-05-31 ENCOUNTER — Inpatient Hospital Stay: Payer: Medicare Other | Attending: Internal Medicine

## 2017-05-31 DIAGNOSIS — I509 Heart failure, unspecified: Secondary | ICD-10-CM | POA: Insufficient documentation

## 2017-05-31 DIAGNOSIS — R109 Unspecified abdominal pain: Secondary | ICD-10-CM | POA: Insufficient documentation

## 2017-05-31 DIAGNOSIS — Z79899 Other long term (current) drug therapy: Secondary | ICD-10-CM | POA: Diagnosis not present

## 2017-05-31 DIAGNOSIS — K219 Gastro-esophageal reflux disease without esophagitis: Secondary | ICD-10-CM | POA: Insufficient documentation

## 2017-05-31 DIAGNOSIS — C772 Secondary and unspecified malignant neoplasm of intra-abdominal lymph nodes: Secondary | ICD-10-CM

## 2017-05-31 DIAGNOSIS — Z86718 Personal history of other venous thrombosis and embolism: Secondary | ICD-10-CM | POA: Insufficient documentation

## 2017-05-31 DIAGNOSIS — G8929 Other chronic pain: Secondary | ICD-10-CM | POA: Insufficient documentation

## 2017-05-31 DIAGNOSIS — E041 Nontoxic single thyroid nodule: Secondary | ICD-10-CM | POA: Diagnosis not present

## 2017-05-31 DIAGNOSIS — Z85038 Personal history of other malignant neoplasm of large intestine: Secondary | ICD-10-CM | POA: Diagnosis not present

## 2017-05-31 DIAGNOSIS — Z800 Family history of malignant neoplasm of digestive organs: Secondary | ICD-10-CM | POA: Diagnosis not present

## 2017-05-31 DIAGNOSIS — R970 Elevated carcinoembryonic antigen [CEA]: Secondary | ICD-10-CM | POA: Insufficient documentation

## 2017-05-31 DIAGNOSIS — I110 Hypertensive heart disease with heart failure: Secondary | ICD-10-CM | POA: Diagnosis not present

## 2017-05-31 DIAGNOSIS — R9389 Abnormal findings on diagnostic imaging of other specified body structures: Secondary | ICD-10-CM | POA: Diagnosis not present

## 2017-05-31 DIAGNOSIS — I2510 Atherosclerotic heart disease of native coronary artery without angina pectoris: Secondary | ICD-10-CM | POA: Diagnosis not present

## 2017-05-31 DIAGNOSIS — Z86711 Personal history of pulmonary embolism: Secondary | ICD-10-CM | POA: Diagnosis not present

## 2017-05-31 DIAGNOSIS — K7689 Other specified diseases of liver: Secondary | ICD-10-CM | POA: Diagnosis not present

## 2017-05-31 DIAGNOSIS — C189 Malignant neoplasm of colon, unspecified: Secondary | ICD-10-CM

## 2017-05-31 LAB — CBC WITH DIFFERENTIAL/PLATELET
BASOS ABS: 0.1 10*3/uL (ref 0–0.1)
BASOS PCT: 1 %
EOS ABS: 0.1 10*3/uL (ref 0–0.7)
Eosinophils Relative: 1 %
HCT: 42 % (ref 35.0–47.0)
HEMOGLOBIN: 13.7 g/dL (ref 12.0–16.0)
Lymphocytes Relative: 37 %
Lymphs Abs: 2 10*3/uL (ref 1.0–3.6)
MCH: 29.9 pg (ref 26.0–34.0)
MCHC: 32.6 g/dL (ref 32.0–36.0)
MCV: 91.7 fL (ref 80.0–100.0)
MONO ABS: 0.3 10*3/uL (ref 0.2–0.9)
MONOS PCT: 7 %
NEUTROS ABS: 2.8 10*3/uL (ref 1.4–6.5)
NEUTROS PCT: 54 %
Platelets: 241 10*3/uL (ref 150–440)
RBC: 4.58 MIL/uL (ref 3.80–5.20)
RDW: 13.8 % (ref 11.5–14.5)
WBC: 5.2 10*3/uL (ref 3.6–11.0)

## 2017-05-31 LAB — COMPREHENSIVE METABOLIC PANEL
ALK PHOS: 76 U/L (ref 38–126)
ALT: 9 U/L — AB (ref 14–54)
ANION GAP: 9 (ref 5–15)
AST: 19 U/L (ref 15–41)
Albumin: 4.1 g/dL (ref 3.5–5.0)
BILIRUBIN TOTAL: 0.7 mg/dL (ref 0.3–1.2)
BUN: 20 mg/dL (ref 6–20)
CALCIUM: 9.7 mg/dL (ref 8.9–10.3)
CO2: 29 mmol/L (ref 22–32)
CREATININE: 1.16 mg/dL — AB (ref 0.44–1.00)
Chloride: 101 mmol/L (ref 101–111)
GFR calc non Af Amer: 41 mL/min — ABNORMAL LOW (ref >60)
GFR, EST AFRICAN AMERICAN: 48 mL/min — AB (ref >60)
Glucose, Bld: 180 mg/dL — ABNORMAL HIGH (ref 65–99)
Potassium: 4 mmol/L (ref 3.5–5.1)
SODIUM: 139 mmol/L (ref 135–145)
TOTAL PROTEIN: 7.6 g/dL (ref 6.5–8.1)

## 2017-06-01 LAB — CEA: CEA1: 60.1 ng/mL — AB (ref 0.0–4.7)

## 2017-06-06 ENCOUNTER — Other Ambulatory Visit: Payer: Medicare Other

## 2017-06-06 ENCOUNTER — Inpatient Hospital Stay: Payer: Medicare Other | Admitting: Internal Medicine

## 2017-06-08 ENCOUNTER — Inpatient Hospital Stay (HOSPITAL_BASED_OUTPATIENT_CLINIC_OR_DEPARTMENT_OTHER): Payer: Medicare Other | Admitting: Internal Medicine

## 2017-06-08 VITALS — BP 136/77 | HR 73 | Temp 97.4°F | Wt 206.7 lb

## 2017-06-08 DIAGNOSIS — K7689 Other specified diseases of liver: Secondary | ICD-10-CM | POA: Diagnosis not present

## 2017-06-08 DIAGNOSIS — C772 Secondary and unspecified malignant neoplasm of intra-abdominal lymph nodes: Principal | ICD-10-CM

## 2017-06-08 DIAGNOSIS — Z800 Family history of malignant neoplasm of digestive organs: Secondary | ICD-10-CM

## 2017-06-08 DIAGNOSIS — R109 Unspecified abdominal pain: Secondary | ICD-10-CM | POA: Diagnosis not present

## 2017-06-08 DIAGNOSIS — Z86711 Personal history of pulmonary embolism: Secondary | ICD-10-CM

## 2017-06-08 DIAGNOSIS — I509 Heart failure, unspecified: Secondary | ICD-10-CM | POA: Diagnosis not present

## 2017-06-08 DIAGNOSIS — Z85038 Personal history of other malignant neoplasm of large intestine: Secondary | ICD-10-CM

## 2017-06-08 DIAGNOSIS — I2510 Atherosclerotic heart disease of native coronary artery without angina pectoris: Secondary | ICD-10-CM | POA: Diagnosis not present

## 2017-06-08 DIAGNOSIS — I110 Hypertensive heart disease with heart failure: Secondary | ICD-10-CM | POA: Diagnosis not present

## 2017-06-08 DIAGNOSIS — Z79899 Other long term (current) drug therapy: Secondary | ICD-10-CM

## 2017-06-08 DIAGNOSIS — E041 Nontoxic single thyroid nodule: Secondary | ICD-10-CM | POA: Diagnosis not present

## 2017-06-08 DIAGNOSIS — K219 Gastro-esophageal reflux disease without esophagitis: Secondary | ICD-10-CM | POA: Diagnosis not present

## 2017-06-08 DIAGNOSIS — R970 Elevated carcinoembryonic antigen [CEA]: Secondary | ICD-10-CM | POA: Diagnosis not present

## 2017-06-08 DIAGNOSIS — C189 Malignant neoplasm of colon, unspecified: Secondary | ICD-10-CM

## 2017-06-08 DIAGNOSIS — R9389 Abnormal findings on diagnostic imaging of other specified body structures: Secondary | ICD-10-CM | POA: Diagnosis not present

## 2017-06-08 DIAGNOSIS — G8929 Other chronic pain: Secondary | ICD-10-CM | POA: Diagnosis not present

## 2017-06-08 DIAGNOSIS — Z86718 Personal history of other venous thrombosis and embolism: Secondary | ICD-10-CM

## 2017-06-08 NOTE — Progress Notes (Signed)
[facility] OFFICE PROGRESS NOTE  Patient Care Team: Jearld Fenton, NP as PCP - General (Internal Medicine)   SUMMARY OF ONCOLOGIC HISTORY:  Oncology History   # July 2016- COLON CANCER; mod diff adeno; STAGE III (pT4aN1a; MSI-STABLE) [s/p surgery;Dr. Arbutus [facility] [place]];No adjuvant chemo sec to co-morbdities DEC 2016- CT-C/ A/P [[facility]]- subcentimeter hepatic cysts ; irregular soft tissue density in the peritoneal fat within pelvis ? Scarring  [less likely recurrence];   # left thyroid nodule- No Bx  # endometrial thickening. Biopsy not done because of cervical stenosis [Dr.Schermerhorn].  # July R LE DVT/Bil PE s/p IVC filter; Hx of GIB; AKI; Hx of Severe Anemia; hx CHF     Colon cancer metastasized to intra-abdominal lymph node ([place])     INTERVAL HISTORY: Patient is a poor historian. Daughter accompanying the patient.  83 year old female patient with above history of stage III with multiple comorbidities- unable to tolerate adjuvant chemotherapy; And history of rising CEA [aug2018 up to 30] is here for follow-up.  Patient continues to have chronic abdominal pain which is slightly worse as per family.  She has been taking oxycodone up to 3 pills a day.  She is not on Neurontin/amitriptyline because of intolerance.   Denies any nausea vomiting or constipation.  Denies any difficulty swallowing or painful swallowing.  REVIEW OF SYSTEMS:  A complete 10 point review of system is done which is negative except mentioned above/history of present illness.   PAST MEDICAL HISTORY :  Past Medical History:  Diagnosis Date  . Anemia   . Blood clot associated with vein wall inflammation   . CHF (congestive heart failure) ([place])   . Chicken pox   . Colon cancer ([place])   . Coronary artery disease   . DVT (deep venous thrombosis) ([place])   . Frequent headaches   . Gastrointestinal hemorrhage   . GERD (gastroesophageal reflux disease)   . Hyperkalemia   . Hypertension   .  Malignant neoplasm of stomach ([place])   . PE (pulmonary embolism)   . Renal disorder   . Renal insufficiency   . Vitamin D deficiency     PAST SURGICAL HISTORY :   Past Surgical History:  Procedure Laterality Date  . ABDOMINAL HYSTERECTOMY    . COLON SURGERY    . COLONOSCOPY WITH PROPOFOL N/A 08/23/2016   Procedure: COLONOSCOPY WITH PROPOFOL;  Surgeon: Jonathon Bellows, MD;  Location[facility] ENDOSCOPY;  Service: Gastroenterology;  Laterality: N/A;  . COLONOSCOPY WITH PROPOFOL N/A 08/24/2016   Procedure: COLONOSCOPY WITH PROPOFOL;  Surgeon: Jonathon Bellows, MD;  Location: [facility] ENDOSCOPY;  Service: Endoscopy;  Laterality: N/A;  . IVC FILTER PLACEMENT ([place] HX)    . JOINT REPLACEMENT Right    knee    FAMILY HISTORY :   Family History  Problem Relation Age of Onset  . Arthritis Mother   . Hypertension Mother   . Colon cancer Sister   . Heart disease Daughter   . Colon cancer Son   . Pancreatic cancer Son     SOCIAL HISTORY:   Social History   Tobacco Use  . Smoking status: Never Smoker  . Smokeless tobacco: Never Used  Substance Use Topics  . Alcohol use: No  . Drug use: No    ALLERGIES:  is allergic to gabapentin; reglan [metoclopramide]; statins; and torsemide.  MEDICATIONS:  Current Outpatient Medications  Medication Sig Dispense Refill  . Cholecalciferol (VITAMIN D-3) 1000 UNITS CAPS Take 2 capsules by mouth daily.    Marland Kitchen  furosemide (LASIX) 40 MG tablet TAKE 1 TABLET (40 MG TOTAL) BY MOUTH 2 (TWO) TIMES DAILY AS NEEDED. 60 tablet 3  . Magnesium Oxide 500 MG TABS Take 1 tablet by mouth 2 (two) times daily.    Marland Kitchen MEGARED OMEGA-3 KRILL OIL 500 MG CAPS Take by mouth.    Marland Kitchen omeprazole (PRILOSEC) 20 MG capsule Take 20 mg by mouth daily.    Marland Kitchen oxyCODONE-acetaminophen (PERCOCET) 10-325 MG tablet Take 1 tablet every 8 (eight) hours as needed by mouth for pain. 90 tablet 0  . Polyethylene Glycol 3350 (MIRALAX PO) Take by mouth daily.    Marland Kitchen spironolactone (ALDACTONE) 25 MG tablet Take 1 tablet  (25 mg total) by mouth 2 (two) times daily as needed. 180 tablet 3  . vitamin C (ASCORBIC ACID) 500 MG tablet Take 500 mg by mouth daily.     No current facility-administered medications for this visit.     PHYSICAL EXAMINATION: ECOG PERFORMANCE STATUS: 3 - Symptomatic, >50% confined to bed  BP 136/77 (BP Location: Left Arm, Patient Position: Sitting)   Pulse 73   Temp (!) 97.4 F (36.3 C) (Tympanic)   Wt 206 lb 11.2 oz (93.8 kg)   BMI 36.62 kg/m   Filed Weights   06/08/17 1412  Weight: 206 lb 11.2 oz (93.8 kg)    Accompanied by her daughter.  ENERAL: Well-nourished well-developed; Alert, no distress and comfortable. In wheel chair.  EYES: no pallor or icterus OROPHARYNX: no thrush or ulceration; good dentition  NECK: supple, no masses felt LYMPH: no palpable lymphadenopathy in the cervical, axillary or inguinal regions LUNGS: clear to auscultation and No wheeze or crackles HEART/CVS: regular rate & rhythm and no murmurs; bilateral lower extremity swelling. ABDOMEN: abdomen soft, non-tender and normal bowel sounds Musculoskeletal:no cyanosis of digits and no clubbing  PSYCH: alert & oriented x 3 with fluent speech NEURO: no focal motor/sensory deficits SKIN: no rashes or significant lesions   LABORATORY DATA:  I have reviewed the data as listed    Component Value Date/Time   NA 139 05/31/2017 1105   NA 141 09/11/2015 0920   K 4.0 05/31/2017 1105   CL 101 05/31/2017 1105   CO2 29 05/31/2017 1105   GLUCOSE 180 (H) 05/31/2017 1105   BUN 20 05/31/2017 1105   BUN 25 09/11/2015 0920   CREATININE 1.16 (H) 05/31/2017 1105   CALCIUM 9.7 05/31/2017 1105   PROT 7.6 05/31/2017 1105   ALBUMIN 4.1 05/31/2017 1105   AST 19 05/31/2017 1105   ALT 9 (L) 05/31/2017 1105   ALKPHOS 76 05/31/2017 1105   BILITOT 0.7 05/31/2017 1105   GFRNONAA 41 (L) 05/31/2017 1105   GFRAA 48 (L) 05/31/2017 1105    No results found for: SPEP, UPEP  Lab Results  Component Value Date    WBC 5.2 05/31/2017   NEUTROABS 2.8 05/31/2017   HGB 13.7 05/31/2017   HCT 42.0 05/31/2017   MCV 91.7 05/31/2017   PLT 241 05/31/2017      Chemistry      Component Value Date/Time   NA 139 05/31/2017 1105   NA 141 09/11/2015 0920   K 4.0 05/31/2017 1105   CL 101 05/31/2017 1105   CO2 29 05/31/2017 1105   BUN 20 05/31/2017 1105   BUN 25 09/11/2015 0920   CREATININE 1.16 (H) 05/31/2017 1105      Component Value Date/Time   CALCIUM 9.7 05/31/2017 1105   ALKPHOS 76 05/31/2017 1105   AST 19 05/31/2017 1105  ALT 9 (L) 05/31/2017 1105   BILITOT 0.7 05/31/2017 1105     Results for MULKI, ROESLER (MRN [id number]) as of 06/08/2017 14:31  Ref. Range 12/01/2015 12:00 05/24/2016 15:35 08/16/2016 13:33 01/31/2017 14:08 05/31/2017 11:05  CEA Latest Ref Range: 0.0 - 4.7 ng/mL 10.3 (H) 20.2 (H) 20.0 (H)    CEA Latest Ref Range: 0.0 - 4.7 ng/mL    36.2 (H) 60.1 (H)      ASSESSMENT & PLAN:   Colon cancer metastasized to intra-abdominal lymph node ([place]) # Colon cancer-stage III [2016-pT4N1a] with multiple liver cysts on the CT scan]. No adjuvant therapy [ given multiple comorbidities for performance status].   # PET AUG 2018- NED; colonoscopy- recent negative; however CEA rising-December 2018-60.  # no obvious clinical signs/Symptoms of progressive disease [abdominal pain chronic-see discussion below].  Given the absence of new symptoms recommend continued surveillance in approximately 2 months with PET scan.   # Abdominal pain- unclear etiology. Incisional question neuropathic.poor tolerance to gabapentin/amitriptyline; on oxycodone 3 pills a day.  #Endometrial thickening- unable to do biopsy; secondary to cervical stenosis [Dr.Schermerhorn]  # Left thyroid nodule-PET avid August 2018 PET scan; discussed regarding biopsy; patient family interested in follow-up PET scan.   # follow up in 2 months/labs/ PET few days prior.      Cammie Sickle, MD 06/08/2017 9:24 PM

## 2017-06-08 NOTE — Assessment & Plan Note (Addendum)
#   Colon cancer-stage III [2016-pT4N1a] with multiple liver cysts on the CT scan]. No adjuvant therapy [ given multiple comorbidities for performance status].   # PET AUG 2018- NED; colonoscopy- recent negative; however CEA rising-December 2018-60.  # no obvious clinical signs/Symptoms of progressive disease [abdominal pain chronic-see discussion below].  Given the absence of new symptoms recommend continued surveillance in approximately 2 months with PET scan.   # Abdominal pain- unclear etiology. Incisional question neuropathic.poor tolerance to gabapentin/amitriptyline; on oxycodone 3 pills a day.  #Endometrial thickening- unable to do biopsy; secondary to cervical stenosis [Dr.Schermerhorn]  # Left thyroid nodule-PET avid August 2018 PET scan; discussed regarding biopsy; patient family interested in follow-up PET scan.   # follow up in 2 months/labs/ PET few days prior.

## 2017-06-27 ENCOUNTER — Telehealth: Payer: Self-pay | Admitting: Cardiovascular Disease

## 2017-06-27 NOTE — Telephone Encounter (Signed)
Will consider increasing Lasix up to 40 mg twice a day rather than once a day through the remainder of the week baseline weight 206 Once shortness of breath and edema improved and weight back to baseline would go back to Lasix daily

## 2017-06-27 NOTE — Telephone Encounter (Signed)
I left a message for the patient's daughter to call. 

## 2017-06-27 NOTE — Telephone Encounter (Signed)
Pt daughter states pt has been wheezing and SOB, more so than normal. Pt c/o Shortness Of Breath: STAT if SOB developed within the last 24 hours or pt is noticeably SOB on the phone  1. Are you currently SOB (can you hear that pt is SOB on the phone)? Not sure, pt is asleep  2. How long have you been experiencing SOB? 2 weeks ago, has noticeable getting worse  3. Are you SOB when sitting or when up moving around? Moving around. Just to get up out of the chair, and take steps she is "totally out of breath". Wheezing can be heard when sitting.   4. Are you currently experiencing any other symptoms? Dizziness started before the SOB started, states has been going on about a month. When she gets up from sitting down.

## 2017-06-27 NOTE — Telephone Encounter (Signed)
Called pt's daughter, [name]. No answer, attempted to leave VM but machine kept cutting off before message complete. Will call again.

## 2017-06-27 NOTE — Telephone Encounter (Signed)
I spoke with the patient's daughter, [name]. She reports that the patient has had increased SOB over the last 1-2 weeks and this is worse with activity.  The patient's weight has been trending up from 203 lbs- 218 lbs progressively over this time period. Per our medical record, the patient's last weight with Dr. Rogue Bussing on 06/08/17 was 206 lbs. Per the patient's daughter, she is 216 lbs today. She was 216 lbs yesterday, and 218.6 lbs on 12/29.  She sleeps on an inclined hospital bed at home, but has required no extra incline or pillows at night.  The patient's daughter has noticed some LE swelling and is unsure if the patient's urine output has changed. No recent changes in fluid intake/ diet. The patient does currently have stage 3 colon cancer, but is not a candidate for chemo.   Confirmed with [name] that the patient does take lasix 40 mg once daily & spironolactone 25 mg once daily. She has not given the patient any PRN lasix over the last few weeks. I advised I will need to forward to Dr. Rockey Situ to review and call her back with recommendations.  She is agreeable.

## 2017-06-27 NOTE — Telephone Encounter (Signed)
I left a message of Dr. Donivan Scull recommendations on [name]'s voice mail (ok per DPR). I asked that she call back on Wednesday as we are closed tomorrow just to confirm she got this message.

## 2017-06-27 NOTE — Telephone Encounter (Signed)
Pt daughter is returning your call. 

## 2017-06-29 NOTE — Telephone Encounter (Signed)
Left voicemail message to call back to review.

## 2017-06-30 NOTE — Telephone Encounter (Signed)
S/w patient's daughter. She verbalized understanding of instructions. She will call next week if patient does not se an improvement in weight, edema and shortness of breath.

## 2017-07-06 ENCOUNTER — Other Ambulatory Visit: Payer: Medicare Other

## 2017-07-06 ENCOUNTER — Ambulatory Visit: Payer: Medicare Other | Admitting: Internal Medicine

## 2017-08-03 ENCOUNTER — Encounter
Admission: RE | Admit: 2017-08-03 | Discharge: 2017-08-03 | Disposition: A | Discharge status: Home / Self Care | Payer: Medicare Other | Source: Ambulatory Visit | Attending: Internal Medicine | Admitting: Internal Medicine

## 2017-08-03 DIAGNOSIS — C189 Malignant neoplasm of colon, unspecified: Secondary | ICD-10-CM | POA: Insufficient documentation

## 2017-08-03 DIAGNOSIS — C772 Secondary and unspecified malignant neoplasm of intra-abdominal lymph nodes: Secondary | ICD-10-CM | POA: Insufficient documentation

## 2017-08-03 LAB — GLUCOSE, CAPILLARY: GLUCOSE-CAPILLARY: 110 mg/dL — AB (ref 65–99)

## 2017-08-03 MED ORDER — FLUDEOXYGLUCOSE F - 18 (FDG) INJECTION
12.0000 | Freq: Once | INTRAVENOUS | Status: AC | PRN
  Administered 2017-08-03: 12.52 via INTRAVENOUS

## 2017-08-05 ENCOUNTER — Inpatient Hospital Stay: Payer: Medicare Other | Attending: Internal Medicine

## 2017-08-05 ENCOUNTER — Encounter: Payer: Self-pay | Admitting: Internal Medicine

## 2017-08-05 ENCOUNTER — Inpatient Hospital Stay (HOSPITAL_BASED_OUTPATIENT_CLINIC_OR_DEPARTMENT_OTHER): Payer: Medicare Other | Admitting: Internal Medicine

## 2017-08-05 ENCOUNTER — Other Ambulatory Visit: Payer: Self-pay

## 2017-08-05 VITALS — BP 136/76 | HR 66 | Temp 98.4°F | Resp 22 | Ht 63.0 in | Wt 217.0 lb

## 2017-08-05 DIAGNOSIS — Z86718 Personal history of other venous thrombosis and embolism: Secondary | ICD-10-CM

## 2017-08-05 DIAGNOSIS — R109 Unspecified abdominal pain: Secondary | ICD-10-CM | POA: Diagnosis not present

## 2017-08-05 DIAGNOSIS — Z86711 Personal history of pulmonary embolism: Secondary | ICD-10-CM | POA: Insufficient documentation

## 2017-08-05 DIAGNOSIS — Z85038 Personal history of other malignant neoplasm of large intestine: Secondary | ICD-10-CM | POA: Diagnosis not present

## 2017-08-05 DIAGNOSIS — I10 Essential (primary) hypertension: Secondary | ICD-10-CM

## 2017-08-05 DIAGNOSIS — E041 Nontoxic single thyroid nodule: Secondary | ICD-10-CM

## 2017-08-05 DIAGNOSIS — N882 Stricture and stenosis of cervix uteri: Secondary | ICD-10-CM | POA: Insufficient documentation

## 2017-08-05 DIAGNOSIS — C772 Secondary and unspecified malignant neoplasm of intra-abdominal lymph nodes: Principal | ICD-10-CM

## 2017-08-05 DIAGNOSIS — R9389 Abnormal findings on diagnostic imaging of other specified body structures: Secondary | ICD-10-CM | POA: Diagnosis not present

## 2017-08-05 DIAGNOSIS — C189 Malignant neoplasm of colon, unspecified: Secondary | ICD-10-CM

## 2017-08-05 LAB — CBC WITH DIFFERENTIAL/PLATELET
Basophils Absolute: 0 10*3/uL (ref 0–0.1)
Basophils Relative: 1 %
EOS ABS: 0.1 10*3/uL (ref 0–0.7)
Eosinophils Relative: 2 %
HCT: 40.2 % (ref 35.0–47.0)
Hemoglobin: 13.2 g/dL (ref 12.0–16.0)
LYMPHS ABS: 2.3 10*3/uL (ref 1.0–3.6)
Lymphocytes Relative: 43 %
MCH: 30.2 pg (ref 26.0–34.0)
MCHC: 32.9 g/dL (ref 32.0–36.0)
MCV: 91.9 fL (ref 80.0–100.0)
MONO ABS: 0.5 10*3/uL (ref 0.2–0.9)
Monocytes Relative: 9 %
Neutro Abs: 2.4 10*3/uL (ref 1.4–6.5)
Neutrophils Relative %: 45 %
PLATELETS: 228 10*3/uL (ref 150–440)
RBC: 4.37 MIL/uL (ref 3.80–5.20)
RDW: 14.2 % (ref 11.5–14.5)
WBC: 5.3 10*3/uL (ref 3.6–11.0)

## 2017-08-05 LAB — COMPREHENSIVE METABOLIC PANEL
ALT: 8 U/L — AB (ref 14–54)
AST: 19 U/L (ref 15–41)
Albumin: 4 g/dL (ref 3.5–5.0)
Alkaline Phosphatase: 66 U/L (ref 38–126)
Anion gap: 11 (ref 5–15)
BILIRUBIN TOTAL: 0.6 mg/dL (ref 0.3–1.2)
BUN: 18 mg/dL (ref 6–20)
CHLORIDE: 103 mmol/L (ref 101–111)
CO2: 27 mmol/L (ref 22–32)
Calcium: 9.6 mg/dL (ref 8.9–10.3)
Creatinine, Ser: 1.07 mg/dL — ABNORMAL HIGH (ref 0.44–1.00)
GFR calc Af Amer: 53 mL/min — ABNORMAL LOW (ref >60)
GFR, EST NON AFRICAN AMERICAN: 46 mL/min — AB (ref >60)
Glucose, Bld: 136 mg/dL — ABNORMAL HIGH (ref 65–99)
Potassium: 3.8 mmol/L (ref 3.5–5.1)
Sodium: 141 mmol/L (ref 135–145)
TOTAL PROTEIN: 7 g/dL (ref 6.5–8.1)

## 2017-08-05 NOTE — Assessment & Plan Note (Addendum)
#   Colon cancer-stage III [2016-pT4N1a] with multiple liver cysts on the CT scan]. No adjuvant therapy [ given multiple comorbidities for performance status].   # PET FEB 2019- NEG; colonoscopy- recent negative; however CEA rising-December 2018-60.  # no obvious clinical signs/Symptoms of progressive disease [abdominal pain chronic-see discussion below].   # Abdominal pain- unclear etiology. Incisional question neuropathic.poor tolerance to gabapentin/amitriptyline; on oxycodone 3 pills a day.  #Endometrial thickening- unable to do biopsy; secondary to cervical stenosis [Dr.Schermerhorn]  # Left thyroid nodule- stable on PET; hold off biopsy for now.   Follow up in 6 months/lab-CEA.   # I reviewed the blood work- with the patient in detail; also reviewed the imaging independently [as summarized above]; and with the patient in detail.   Addendum: Discussed at tumor conference- no obvious source of up trending CEA. Continue clinical monitoring. Add calcitonin.

## 2017-08-05 NOTE — Progress Notes (Signed)
[facility] OFFICE PROGRESS NOTE  Patient Care Team: Jearld Fenton, NP as PCP - General (Internal Medicine)   SUMMARY OF ONCOLOGIC HISTORY:  Oncology History   # July 2016- COLON CANCER; mod diff adeno; STAGE III (pT4aN1a; MSI-STABLE) [s/p surgery;Dr. Arbutus Ped [place]];No adjuvant chemo sec to co-morbdities DEC 2016- CT-C/ A/P [[facility]]- subcentimeter hepatic cysts ; irregular soft tissue density in the peritoneal fat within pelvis ? Scarring  [less likely recurrence];   # left thyroid nodule- No Bx  # endometrial thickening. Biopsy not done because of cervical stenosis [Dr.Schermerhorn].  # July R LE DVT/Bil PE s/p IVC filter; Hx of GIB; AKI; Hx of Severe Anemia; hx CHF     Colon cancer metastasized to intra-abdominal lymph node ([place])     INTERVAL HISTORY: Patient is a poor historian. Daughter accompanying the patient.  84 year old female patient with above history of stage III with multiple comorbidities- unable to tolerate adjuvant chemotherapy; And history of rising CEA [Dec 2018- CEA up to 60] is here for follow-up/reviewed the results of the PET scan.  Patient continues to have chronic abdominal pain which is slightly worse as per family. Denies any nausea vomiting or constipation.  Denies any difficulty swallowing or painful swallowing. She has been taking oxycodone up to 3 pills a day.    REVIEW OF SYSTEMS:  A complete 10 point review of system is done which is negative except mentioned above/history of present illness.   PAST MEDICAL HISTORY :  Past Medical History:  Diagnosis Date  . Anemia   . Blood clot associated with vein wall inflammation   . CHF (congestive heart failure) ([place])   . Chicken pox   . Colon cancer ([place])   . Coronary artery disease   . DVT (deep venous thrombosis) ([place])   . Frequent headaches   . Gastrointestinal hemorrhage   . GERD (gastroesophageal reflux disease)   . Hyperkalemia   . Hypertension   . Malignant neoplasm of  stomach ([place])   . PE (pulmonary embolism)   . Renal disorder   . Renal insufficiency   . Vitamin D deficiency     PAST SURGICAL HISTORY :   Past Surgical History:  Procedure Laterality Date  . ABDOMINAL HYSTERECTOMY    . COLON SURGERY    . COLONOSCOPY WITH PROPOFOL N/A 08/23/2016   Procedure: COLONOSCOPY WITH PROPOFOL;  Surgeon: Jonathon Bellows, MD;  Location[facility] ENDOSCOPY;  Service: Gastroenterology;  Laterality: N/A;  . COLONOSCOPY WITH PROPOFOL N/A 08/24/2016   Procedure: COLONOSCOPY WITH PROPOFOL;  Surgeon: Jonathon Bellows, MD;  Location: [facility] ENDOSCOPY;  Service: Endoscopy;  Laterality: N/A;  . IVC FILTER PLACEMENT ([place] HX)    . JOINT REPLACEMENT Right    knee    FAMILY HISTORY :   Family History  Problem Relation Age of Onset  . Arthritis Mother   . Hypertension Mother   . Colon cancer Sister   . Heart disease Daughter   . Colon cancer Son   . Pancreatic cancer Son     SOCIAL HISTORY:   Social History   Tobacco Use  . Smoking status: Never Smoker  . Smokeless tobacco: Never Used  Substance Use Topics  . Alcohol use: No  . Drug use: No    ALLERGIES:  is allergic to gabapentin; reglan [metoclopramide]; statins; and torsemide.  MEDICATIONS:  Current Outpatient Medications  Medication Sig Dispense Refill  . Cholecalciferol (VITAMIN D-3) 1000 UNITS CAPS Take 2 capsules by mouth daily.    Marland Kitchen  furosemide (LASIX) 40 MG tablet TAKE 1 TABLET (40 MG TOTAL) BY MOUTH 2 (TWO) TIMES DAILY AS NEEDED. 60 tablet 3  . Magnesium Oxide 500 MG TABS Take 1 tablet by mouth 2 (two) times daily.    Marland Kitchen MEGARED OMEGA-3 KRILL OIL 500 MG CAPS Take by mouth.    Marland Kitchen omeprazole (PRILOSEC) 20 MG capsule Take 20 mg by mouth daily.    Marland Kitchen oxyCODONE-acetaminophen (PERCOCET) 10-325 MG tablet Take 1 tablet every 8 (eight) hours as needed by mouth for pain. 90 tablet 0  . Polyethylene Glycol 3350 (MIRALAX PO) Take by mouth daily.    Marland Kitchen spironolactone (ALDACTONE) 25 MG tablet Take 1 tablet (25 mg total) by mouth  2 (two) times daily as needed. 180 tablet 3  . vitamin C (ASCORBIC ACID) 500 MG tablet Take 500 mg by mouth daily.     No current facility-administered medications for this visit.     PHYSICAL EXAMINATION: ECOG PERFORMANCE STATUS: 3 - Symptomatic, >50% confined to bed  BP 136/76 (BP Location: Left Arm, Patient Position: Sitting)   Pulse 66   Temp 98.4 F (36.9 C) (Tympanic)   Resp (!) 22   Ht '5\' 3"'  (1.6 m)   Wt 217 lb (98.4 kg)   BMI 38.44 kg/m   Filed Weights   08/05/17 1503  Weight: 217 lb (98.4 kg)    Accompanied by her daughter.  ENERAL: Well-nourished well-developed; Alert, no distress and comfortable. In wheel chair.  EYES: no pallor or icterus OROPHARYNX: no thrush or ulceration; good dentition  NECK: supple, no masses felt LYMPH: no palpable lymphadenopathy in the cervical, axillary or inguinal regions LUNGS: clear to auscultation and No wheeze or crackles HEART/CVS: regular rate & rhythm and no murmurs; bilateral lower extremity swelling. ABDOMEN: abdomen soft, non-tender and normal bowel sounds Musculoskeletal:no cyanosis of digits and no clubbing  PSYCH: alert & oriented x 3 with fluent speech NEURO: no focal motor/sensory deficits SKIN: no rashes or significant lesions   LABORATORY DATA:  I have reviewed the data as listed    Component Value Date/Time   NA 141 08/05/2017 1356   NA 141 09/11/2015 0920   K 3.8 08/05/2017 1356   CL 103 08/05/2017 1356   CO2 27 08/05/2017 1356   GLUCOSE 136 (H) 08/05/2017 1356   BUN 18 08/05/2017 1356   BUN 25 09/11/2015 0920   CREATININE 1.07 (H) 08/05/2017 1356   CALCIUM 9.6 08/05/2017 1356   PROT 7.0 08/05/2017 1356   ALBUMIN 4.0 08/05/2017 1356   AST 19 08/05/2017 1356   ALT 8 (L) 08/05/2017 1356   ALKPHOS 66 08/05/2017 1356   BILITOT 0.6 08/05/2017 1356   GFRNONAA 46 (L) 08/05/2017 1356   GFRAA 53 (L) 08/05/2017 1356    No results found for: SPEP, UPEP  Lab Results  Component Value Date   WBC 5.3  08/05/2017   NEUTROABS 2.4 08/05/2017   HGB 13.2 08/05/2017   HCT 40.2 08/05/2017   MCV 91.9 08/05/2017   PLT 228 08/05/2017      Chemistry      Component Value Date/Time   NA 141 08/05/2017 1356   NA 141 09/11/2015 0920   K 3.8 08/05/2017 1356   CL 103 08/05/2017 1356   CO2 27 08/05/2017 1356   BUN 18 08/05/2017 1356   BUN 25 09/11/2015 0920   CREATININE 1.07 (H) 08/05/2017 1356      Component Value Date/Time   CALCIUM 9.6 08/05/2017 1356   ALKPHOS 66 08/05/2017 1356  AST 19 08/05/2017 1356   ALT 8 (L) 08/05/2017 1356   BILITOT 0.6 08/05/2017 1356      Results for NAJE, RICE (MRN [id number]) as of 08/06/2017 09:11  Ref. Range 05/24/2016 15:35 08/16/2016 13:33 01/31/2017 14:08 05/31/2017 11:05 08/05/2017 13:56  CEA Latest Ref Range: 0.0 - 4.7 ng/mL 20.2 (H) 20.0 (H)     CEA Latest Ref Range: 0.0 - 4.7 ng/mL   36.2 (H) 60.1 (H) 74.1 (H)     ASSESSMENT & PLAN:   Colon cancer metastasized to intra-abdominal lymph node ([place]) # Colon cancer-stage III [2016-pT4N1a] with multiple liver cysts on the CT scan]. No adjuvant therapy [ given multiple comorbidities for performance status].   # PET FEB 2019- NEG; colonoscopy- recent negative; however CEA rising-December 2018-60.  # no obvious clinical signs/Symptoms of progressive disease [abdominal pain chronic-see discussion below].   # Abdominal pain- unclear etiology. Incisional question neuropathic.poor tolerance to gabapentin/amitriptyline; on oxycodone 3 pills a day.  #Endometrial thickening- unable to do biopsy; secondary to cervical stenosis [Dr.Schermerhorn]  # Left thyroid nodule- stable on PET; hold off biopsy for now.   Follow up in 6 months/lab-CEA.   # I reviewed the blood work- with the patient in detail; also reviewed the imaging independently [as summarized above]; and with the patient in detail.       Cammie Sickle, MD 08/06/2017 9:12 AM

## 2017-08-06 LAB — CEA: CEA: 74.1 ng/mL — ABNORMAL HIGH (ref 0.0–4.7)

## 2017-08-09 ENCOUNTER — Encounter: Payer: Self-pay | Admitting: Internal Medicine

## 2017-08-09 ENCOUNTER — Ambulatory Visit (INDEPENDENT_AMBULATORY_CARE_PROVIDER_SITE_OTHER): Payer: Medicare Other | Admitting: Internal Medicine

## 2017-08-09 VITALS — BP 136/78 | HR 70 | Temp 98.5°F | Wt 217.0 lb

## 2017-08-09 DIAGNOSIS — Z79899 Other long term (current) drug therapy: Secondary | ICD-10-CM | POA: Diagnosis not present

## 2017-08-09 DIAGNOSIS — I5032 Chronic diastolic (congestive) heart failure: Secondary | ICD-10-CM | POA: Diagnosis not present

## 2017-08-09 DIAGNOSIS — I10 Essential (primary) hypertension: Secondary | ICD-10-CM | POA: Diagnosis not present

## 2017-08-09 DIAGNOSIS — M150 Primary generalized (osteo)arthritis: Secondary | ICD-10-CM

## 2017-08-09 DIAGNOSIS — K219 Gastro-esophageal reflux disease without esophagitis: Secondary | ICD-10-CM | POA: Diagnosis not present

## 2017-08-09 DIAGNOSIS — M1990 Unspecified osteoarthritis, unspecified site: Secondary | ICD-10-CM | POA: Insufficient documentation

## 2017-08-09 DIAGNOSIS — M159 Polyosteoarthritis, unspecified: Secondary | ICD-10-CM

## 2017-08-09 MED ORDER — OXYCODONE-ACETAMINOPHEN 10-325 MG PO TABS: 1.0000 | ORAL_TABLET | Freq: Three times a day (TID) | ORAL | 0 refills | Status: DC | PRN

## 2017-08-09 NOTE — Assessment & Plan Note (Signed)
Continue Omeprazole for now Try to avoid foods that trigger your reflux

## 2017-08-09 NOTE — Patient Instructions (Signed)

## 2017-08-09 NOTE — Assessment & Plan Note (Signed)
Encouraged her to schedule Tylenol and only take Percocet if needed CSA and UDS today Percocet refilled today

## 2017-08-09 NOTE — Assessment & Plan Note (Signed)
Compensated Continue Spironolactone and Lasix She will continue to follow with Dr. Rockey Situ

## 2017-08-09 NOTE — Progress Notes (Signed)
Subjective:    Patient ID: Jill Bowers, female    DOB: 1929/01/07, 84 y.o.   MRN: [id number]  HPI  Pt presents to the clinic today for follow up of chronic conditions and chronic pain management appt.  CHF: Compensated. She is taking Lasix and Spironolactone as prescribed. She does have edema in her legs and SOB with exertion, but this is not new. She denies chest pain. She follows with Dr. Rockey Situ.  GERD: Controlled on Omeprazole. She takes Tums as needed for breakthrough with good relief.  HTN: Her BP today is 136/78. She is taking Lasix and Spironolactone as prescribed.  OA: Mainly in her back and knees. She had xrays bilateral knees 12/2015.  Indication for chronic opioid: Chronic back and knee pain secondary to OA Medication and dose: Oxycodone/Acetominophen 03-3234 mg tabs, 1 tab PO TID prn # pills per month: 60, only gets a RX every 8 weeks. Last UDS date: never Pain contract signed (Y/N): never Date narcotic database last reviewed (include red flags): 08/09/2017   Review of Systems      Past Medical History:  Diagnosis Date  . Anemia   . Blood clot associated with vein wall inflammation   . CHF (congestive heart failure) ([place])   . Chicken pox   . Colon cancer ([place])   . Coronary artery disease   . DVT (deep venous thrombosis) ([place])   . Frequent headaches   . Gastrointestinal hemorrhage   . GERD (gastroesophageal reflux disease)   . Hyperkalemia   . Hypertension   . Malignant neoplasm of stomach ([place])   . PE (pulmonary embolism)   . Renal disorder   . Renal insufficiency   . Vitamin D deficiency     Current Outpatient Medications  Medication Sig Dispense Refill  . Cholecalciferol (VITAMIN D-3) 1000 UNITS CAPS Take 2 capsules by mouth daily.    . furosemide (LASIX) 40 MG tablet TAKE 1 TABLET (40 MG TOTAL) BY MOUTH 2 (TWO) TIMES DAILY AS NEEDED. 60 tablet 3  . Magnesium Oxide 500 MG TABS Take 1 tablet by mouth 2 (two) times daily.    Marland Kitchen MEGARED OMEGA-3 KRILL  OIL 500 MG CAPS Take by mouth.    Marland Kitchen omeprazole (PRILOSEC) 20 MG capsule Take 20 mg by mouth daily.    Marland Kitchen oxyCODONE-acetaminophen (PERCOCET) 10-325 MG tablet Take 1 tablet every 8 (eight) hours as needed by mouth for pain. 90 tablet 0  . Polyethylene Glycol 3350 (MIRALAX PO) Take by mouth daily.    Marland Kitchen spironolactone (ALDACTONE) 25 MG tablet Take 1 tablet (25 mg total) by mouth 2 (two) times daily as needed. 180 tablet 3  . vitamin C (ASCORBIC ACID) 500 MG tablet Take 500 mg by mouth daily.     No current facility-administered medications for this visit.     Allergies  Allergen Reactions  . Gabapentin Other (See Comments)    Nightmares   . Reglan [Metoclopramide] Other (See Comments)  . Statins Other (See Comments)  . Torsemide Other (See Comments)    Family History  Problem Relation Age of Onset  . Arthritis Mother   . Hypertension Mother   . Colon cancer Sister   . Heart disease Daughter   . Colon cancer Son   . Pancreatic cancer Son     Social History   Socioeconomic History  . Marital status: Widowed    Spouse name: Not on file  . Number of children: Not on file  . Years of education: Not on file  .  Highest education level: Not on file  Social Needs  . Financial resource strain: Not on file  . Food insecurity - worry: Not on file  . Food insecurity - inability: Not on file  . Transportation needs - medical: Not on file  . Transportation needs - non-medical: Not on file  Occupational History  . Not on file  Tobacco Use  . Smoking status: Never Smoker  . Smokeless tobacco: Never Used  Substance and Sexual Activity  . Alcohol use: No  . Drug use: No  . Sexual activity: No  Other Topics Concern  . Not on file  Social History Narrative   Lives at home with daughter, has a walker     Constitutional: Pt reports fatigue. Denies fever, malaise, headache or abrupt weight changes.  Respiratory: Pt reports shortness of breath with exertion. Denies difficulty  breathing, cough or sputum production.   Cardiovascular: Pt reports swelling in legs. Denies chest pain, chest tightness, palpitations or swelling in the hands.  Gastrointestinal: Pt reports intermittent abdominal pain. Denies bloating, constipation, diarrhea or blood in the stool.  GU: Pt reports urinary incontinence. Denies frequency, pain with urination, burning sensation, blood in urine, odor or discharge. Musculoskeletal: Pt reports bilateral knee and back pain. Denies decrease in range of motion, difficulty with gait, muscle pain or joint swelling.   Neurological: pt has trouble with balance and coordination, only stands to transfer. Denies dizziness, difficulty with memory, difficulty with speech.   No other specific complaints in a complete review of systems (except as listed in HPI above).  Objective:   Physical Exam   BP 136/78   Pulse 70   Temp 98.5 F (36.9 C) (Oral)   Wt 217 lb (98.4 kg)   SpO2 96%   BMI 38.44 kg/m  Wt Readings from Last 3 Encounters:  08/09/17 217 lb (98.4 kg)  08/05/17 217 lb (98.4 kg)  06/08/17 206 lb 11.2 oz (93.8 kg)    General: Appears her stated age, obese in NAD. Cardiovascular: Normal rate and rhythm. S1,S2 noted.  No murmur, rubs or gallops noted. 1+ BLE edema.  Pulmonary/Chest: Normal effort and positive vesicular breath sounds. No respiratory distress. No wheezes, rales or ronchi noted.  Abdomen: Soft and nontender. Normal bowel sounds.  Musculoskeletal: Bilateral knee joints enlarged. Mild effusions noted. Pain with palpation over bilateral joint lines. Pain with palpation over the lumbar spine. In wheelchair,  Neurological: Alert and oriented.  BMET    Component Value Date/Time   NA 141 08/05/2017 1356   NA 141 09/11/2015 0920   K 3.8 08/05/2017 1356   CL 103 08/05/2017 1356   CO2 27 08/05/2017 1356   GLUCOSE 136 (H) 08/05/2017 1356   BUN 18 08/05/2017 1356   BUN 25 09/11/2015 0920   CREATININE 1.07 (H) 08/05/2017 1356    CALCIUM 9.6 08/05/2017 1356   GFRNONAA 46 (L) 08/05/2017 1356   GFRAA 53 (L) 08/05/2017 1356    Lipid Panel     Component Value Date/Time   CHOL 172 06/12/2015 1125   TRIG 155.0 (H) 06/12/2015 1125   HDL 52.60 06/12/2015 1125   CHOLHDL 3 06/12/2015 1125   VLDL 31.0 06/12/2015 1125   LDLCALC 89 06/12/2015 1125    CBC    Component Value Date/Time   WBC 5.3 08/05/2017 1356   RBC 4.37 08/05/2017 1356   HGB 13.2 08/05/2017 1356   HCT 40.2 08/05/2017 1356   PLT 228 08/05/2017 1356   MCV 91.9 08/05/2017 1356   MCH  30.2 08/05/2017 1356   MCHC 32.9 08/05/2017 1356   RDW 14.2 08/05/2017 1356   LYMPHSABS 2.3 08/05/2017 1356   MONOABS 0.5 08/05/2017 1356   EOSABS 0.1 08/05/2017 1356   BASOSABS 0.0 08/05/2017 1356    Hgb A1C Lab Results  Component Value Date   HGBA1C 5.8 06/12/2015           Assessment & Plan:   Encounter for Chronic Pain Management:  Will have her do CSA and UDS today Percocet refilled today Advised daughter to give her Tylenol TID and only take Percocet if needed  RTC in 6 months for Medicare Wellness Exam Webb Silversmith, NP

## 2017-08-09 NOTE — Assessment & Plan Note (Signed)
Controlled on Lasix and Spironolactone She will continue to follow with cardiology

## 2017-08-10 NOTE — Progress Notes (Signed)
   Subjective:    Patient ID: Jill Bowers, female    DOB: May 05, 1929, 84 y.o.   MRN: [id number]  HPI Chart and treatment plan reviewed---I am in agreement with this   Review of Systems     Objective:   Physical Exam        Assessment & Plan:

## 2017-08-13 ENCOUNTER — Other Ambulatory Visit: Payer: Self-pay | Admitting: Internal Medicine

## 2017-08-13 DIAGNOSIS — E041 Nontoxic single thyroid nodule: Secondary | ICD-10-CM

## 2017-08-13 DIAGNOSIS — C772 Secondary and unspecified malignant neoplasm of intra-abdominal lymph nodes: Principal | ICD-10-CM

## 2017-08-13 DIAGNOSIS — C189 Malignant neoplasm of colon, unspecified: Secondary | ICD-10-CM

## 2017-08-13 NOTE — Progress Notes (Signed)
Discussed at tumor conference- no obvious source of up trending CEA. Recommend checking calcitonin levels at next visit WITH other labs- ordered.

## 2017-08-14 LAB — PAIN MGMT, PROFILE 8 W/CONF, U
6 ACETYLMORPHINE: NEGATIVE ng/mL (ref <10)
ALCOHOL METABOLITES: NEGATIVE ng/mL (ref <500)
Amphetamines: NEGATIVE ng/mL (ref <500)
BENZODIAZEPINES: NEGATIVE ng/mL (ref <100)
Buprenorphine, Urine: NEGATIVE ng/mL (ref <5)
COCAINE METABOLITE: NEGATIVE ng/mL (ref <150)
Codeine: NEGATIVE ng/mL (ref <50)
Creatinine: 286.8 mg/dL
HYDROMORPHONE: NEGATIVE ng/mL (ref <50)
Hydrocodone: NEGATIVE ng/mL (ref <50)
MDMA: NEGATIVE ng/mL (ref <500)
MORPHINE: NEGATIVE ng/mL (ref <50)
Marijuana Metabolite: NEGATIVE ng/mL (ref <20)
Norhydrocodone: NEGATIVE ng/mL (ref <50)
Noroxycodone: 10390 ng/mL — ABNORMAL HIGH (ref <50)
Opiates: NEGATIVE ng/mL (ref <100)
Oxidant: NEGATIVE ug/mL (ref <200)
Oxycodone: 5331 ng/mL — ABNORMAL HIGH (ref <50)
Oxycodone: POSITIVE ng/mL — AB (ref <100)
Oxymorphone: 4114 ng/mL — ABNORMAL HIGH (ref <50)
PH: 6.01 (ref 4.5–9.0)

## 2017-09-05 ENCOUNTER — Other Ambulatory Visit: Payer: Self-pay | Admitting: Cardiovascular Disease

## 2017-10-03 ENCOUNTER — Other Ambulatory Visit: Payer: Self-pay | Admitting: Internal Medicine

## 2017-10-03 NOTE — Telephone Encounter (Signed)
Last filled 08/09/17 at pain mgmt f/u appt... Please advise

## 2017-10-03 NOTE — Telephone Encounter (Signed)
Rx refill request: Percocet 10-325 mg  LOV: 08/09/17  PCP: Baity  Pharmacy: verified

## 2017-10-03 NOTE — Telephone Encounter (Signed)
Copied from [place]. Topic: Quick Communication - Rx Refill/Question >> Oct 03, 2017  9:44 AM Boyd Kerbs wrote: Medication: oxyCODONE-acetaminophen (PERCOCET) 10-325 MG tablet  Has the patient contacted their pharmacy? Yes.   [facility] to call doctor  (Agent: If no, request that the patient contact the pharmacy for the refill.) Preferred Pharmacy (with phone number or street name):   [id number] [place], [address] [place] [address] Phone: [phone] Fax: [phone]   Agent: Please be advised that RX refills may take up to 3 business days. We ask that you follow-up with your pharmacy.

## 2017-10-04 MED ORDER — OXYCODONE-ACETAMINOPHEN 10-325 MG PO TABS: 1.0000 | ORAL_TABLET | Freq: Three times a day (TID) | ORAL | 0 refills | Status: DC | PRN

## 2017-10-12 ENCOUNTER — Ambulatory Visit (INDEPENDENT_AMBULATORY_CARE_PROVIDER_SITE_OTHER): Payer: Medicare Other | Admitting: Family Medicine

## 2017-10-12 ENCOUNTER — Encounter: Payer: Self-pay | Admitting: Family Medicine

## 2017-10-12 VITALS — BP 122/66 | HR 68 | Temp 98.5°F | Ht 63.0 in | Wt 212.5 lb

## 2017-10-12 DIAGNOSIS — C772 Secondary and unspecified malignant neoplasm of intra-abdominal lymph nodes: Secondary | ICD-10-CM | POA: Diagnosis not present

## 2017-10-12 DIAGNOSIS — C189 Malignant neoplasm of colon, unspecified: Secondary | ICD-10-CM | POA: Diagnosis not present

## 2017-10-12 DIAGNOSIS — R109 Unspecified abdominal pain: Secondary | ICD-10-CM

## 2017-10-12 LAB — CBC WITH DIFFERENTIAL/PLATELET
Basophils Absolute: 0 10*3/uL (ref 0.0–0.1)
Basophils Relative: 0.6 % (ref 0.0–3.0)
Eosinophils Absolute: 0.1 10*3/uL (ref 0.0–0.7)
Eosinophils Relative: 1.7 % (ref 0.0–5.0)
HCT: 41.1 % (ref 36.0–46.0)
HEMOGLOBIN: 13.6 g/dL (ref 12.0–15.0)
Lymphocytes Relative: 38.4 % (ref 12.0–46.0)
Lymphs Abs: 1.9 10*3/uL (ref 0.7–4.0)
MCHC: 33.2 g/dL (ref 30.0–36.0)
MCV: 91.9 fl (ref 78.0–100.0)
MONO ABS: 0.5 10*3/uL (ref 0.1–1.0)
Monocytes Relative: 9.3 % (ref 3.0–12.0)
NEUTROS PCT: 50 % (ref 43.0–77.0)
Neutro Abs: 2.5 10*3/uL (ref 1.4–7.7)
Platelets: 225 10*3/uL (ref 150.0–400.0)
RBC: 4.47 Mil/uL (ref 3.87–5.11)
RDW: 13.9 % (ref 11.5–15.5)
WBC: 5 10*3/uL (ref 4.0–10.5)

## 2017-10-12 LAB — COMPREHENSIVE METABOLIC PANEL
ALBUMIN: 4.1 g/dL (ref 3.5–5.2)
ALK PHOS: 58 U/L (ref 39–117)
ALT: 5 U/L (ref 0–35)
AST: 14 U/L (ref 0–37)
BUN: 15 mg/dL (ref 6–23)
CO2: 33 mEq/L — ABNORMAL HIGH (ref 19–32)
CREATININE: 1.17 mg/dL (ref 0.40–1.20)
Calcium: 9.9 mg/dL (ref 8.4–10.5)
Chloride: 101 mEq/L (ref 96–112)
GFR: 56.33 mL/min — ABNORMAL LOW (ref >60.00)
Glucose, Bld: 114 mg/dL — ABNORMAL HIGH (ref 70–99)
Potassium: 4.5 mEq/L (ref 3.5–5.1)
SODIUM: 142 meq/L (ref 135–145)
TOTAL PROTEIN: 7 g/dL (ref 6.0–8.3)
Total Bilirubin: 0.5 mg/dL (ref 0.2–1.2)

## 2017-10-12 LAB — POC URINALSYSI DIPSTICK (AUTOMATED)
Glucose, UA: NEGATIVE
LEUKOCYTES UA: NEGATIVE
NITRITE UA: NEGATIVE
PH UA: 6 (ref 5.0–8.0)
RBC UA: NEGATIVE
Spec Grav, UA: >=1.03 — AB (ref 1.010–1.025)
Urobilinogen, UA: 1 E.U./dL

## 2017-10-12 LAB — LIPASE: LIPASE: 46 U/L (ref 11.0–59.0)

## 2017-10-12 NOTE — Assessment & Plan Note (Signed)
Noted history. CEA trending up without obvious source, recent imaging stable. Low threshold to check CT if unrevealing eval and ongoing abd pain.

## 2017-10-12 NOTE — Patient Instructions (Signed)
Urine looked ok today - a bit concentrated. Increase water by 1 glass a day for next several days.  Labs today - see our referral coordinators to schedule abdominal ultrasound. We will be in touch with results.

## 2017-10-12 NOTE — Assessment & Plan Note (Addendum)
Unclear cause, ?adhesion related pain. Known diverticulosis however no bowel changes, fever. UA today without signs of infection just concentrated - encouraged increased water intake. Check labs today (CBC, CMP, lipase) and abd [place] to further eval L sided abd pain. Low threshold to re-image with CT in h/o metastatic colon cancer (no signs of recurrence up to now)

## 2017-10-12 NOTE — Progress Notes (Signed)
BP 122/66 (BP Location: Left Arm, Patient Position: Sitting, Cuff Size: Large)   Pulse 68   Temp 98.5 F (36.9 C) (Oral)   Ht 5\' 3"  (1.6 m)   Wt 212 lb 8 oz (96.4 kg)   SpO2 95%   BMI 37.64 kg/m    CC: Urinary frequency, abd discomfort Subjective:    Patient ID: Jill Bowers, female    DOB: 1928/09/13, 84 y.o.   MRN: [id number]  HPI: Jill Bowers is a 84 y.o. female presenting on 10/12/2017 for Urinary Frequency (Started about 2 wks ago. Pt accompanied by daughter, [name]. ) and Abdominal Pain (LLQ abd pain that radiates around to left side. Started about 2 wks ago. )   Lives with daughter, here with daughter today. In wheelchair.   2 wk h/o left sided abd pain starts LUQ and travels down L side into LLQ, some also into lumbar back. Describes sharp pain that is associated with increased urinary frequency and urgency (chronic). No recent falls or injury endorsed. Chronic intermittent dyspnea, no worse.   Denies fevers/chills, dysuria, hematuria, diarrhea, constipation (regularly takes miralax), nausea/vomiting, gassiness bloating or indigestion.   No h/o recurrent UTIs.  H/o chronic abd discomfort previously attributed to adhesions.  Known diverticulosis.  Known CHF, she is taking lasix 40mg  once daily and spironolactone 25mg  once daily.   H/o colon cancer with mets to intra-abd LN s/p R hemicolectomy ~2016 without adjuvant therapy, latest colonoscopy 07/2016 diverticulosis otherwise normal Vicente Males). PET scan 07/2017 showed chronic endometrial fluid in uterine fundus suggesting cervical stenosis.   Relevant past medical, surgical, family and social history reviewed and updated as indicated. Interim medical history since our last visit reviewed. Allergies and medications reviewed and updated. Outpatient Medications Prior to Visit  Medication Sig Dispense Refill  . Cholecalciferol (VITAMIN D-3) 1000 UNITS CAPS Take 2 capsules by mouth daily.    . furosemide (LASIX) 40 MG tablet TAKE 1  TABLET (40 MG TOTAL) BY MOUTH 2 (TWO) TIMES DAILY AS NEEDED. 60 tablet 3  . Magnesium Oxide 500 MG TABS Take 1 tablet by mouth 2 (two) times daily.    Marland Kitchen MEGARED OMEGA-3 KRILL OIL 500 MG CAPS Take by mouth.    Marland Kitchen omeprazole (PRILOSEC) 20 MG capsule Take 20 mg by mouth daily.    Marland Kitchen oxyCODONE-acetaminophen (PERCOCET) 10-325 MG tablet Take 1 tablet by mouth every 8 (eight) hours as needed for pain. 90 tablet 0  . Polyethylene Glycol 3350 (MIRALAX PO) Take by mouth daily.    Marland Kitchen spironolactone (ALDACTONE) 25 MG tablet Take 1 tablet (25 mg total) by mouth 2 (two) times daily as needed. 180 tablet 3  . vitamin C (ASCORBIC ACID) 500 MG tablet Take 500 mg by mouth daily.     No facility-administered medications prior to visit.      Per HPI unless specifically indicated in ROS section below Review of Systems     Objective:    BP 122/66 (BP Location: Left Arm, Patient Position: Sitting, Cuff Size: Large)   Pulse 68   Temp 98.5 F (36.9 C) (Oral)   Ht 5\' 3"  (1.6 m)   Wt 212 lb 8 oz (96.4 kg)   SpO2 95%   BMI 37.64 kg/m   Wt Readings from Last 3 Encounters:  10/12/17 212 lb 8 oz (96.4 kg)  08/09/17 217 lb (98.4 kg)  08/05/17 217 lb (98.4 kg)    Physical Exam  Constitutional: She appears well-developed and well-nourished. She does not appear ill.  In wheelchair  HENT:  Mouth/Throat: Oropharynx is clear and moist.  Eyes: Pupils are equal, round, and reactive to light.  Cardiovascular: Normal rate and regular rhythm.  Murmur (systolic) heard. Pulmonary/Chest: Effort normal and breath sounds normal. No stridor. No respiratory distress. She has no wheezes. She has no rhonchi. She has no rales.  Abdominal: Soft. Normal appearance, normal aorta and bowel sounds are normal. She exhibits no ascites. There is no hepatosplenomegaly. There is tenderness (mild-moderate) in the left upper quadrant and left lower quadrant. There is no rigidity, no rebound, no guarding, no CVA tenderness and negative  Murphy's sign.  Neurological: She is alert.  Skin: Skin is warm. Capillary refill takes less than 2 seconds.  Nursing note and vitals reviewed.  Results for orders placed or performed in visit on 10/12/17  POCT Urinalysis Dipstick (Automated)  Result Value Ref Range   Color, UA yellow    Clarity, UA clear    Glucose, UA negative    Bilirubin, UA 1+    Ketones, UA trace    Spec Grav, UA >=1.030 (A) 1.010 - 1.025   Blood, UA negative    pH, UA 6.0 5.0 - 8.0   Protein, UA trace    Urobilinogen, UA 1.0 0.2 or 1.0 E.U./dL   Nitrite, UA negative    Leukocytes, UA Negative Negative       Assessment & Plan:   Problem List Items Addressed This Visit    Colon cancer metastasized to intra-abdominal lymph node ([place])    Noted history. CEA trending up without obvious source, recent imaging stable. Low threshold to check CT if unrevealing eval and ongoing abd pain.       Left sided abdominal pain - Primary    Unclear cause, ?adhesion related pain. Known diverticulosis however no bowel changes, fever. UA today without signs of infection just concentrated - encouraged increased water intake. Check labs today (CBC, CMP, lipase) and abd [place] to further eval L sided abd pain. Low threshold to re-image with CT in h/o metastatic colon cancer (no signs of recurrence up to now)      Relevant Orders   Comprehensive metabolic panel   CBC with Differential/Platelet   Lipase   US Abdomen Complete   POCT Urinalysis Dipstick (Automated) (Completed)       No orders of the defined types were placed in this encounter.  Orders Placed This Encounter  Procedures  . US Abdomen Complete    Standing Status:   Future    Standing Expiration Date:   12/13/2018    Order Specific Question:   Reason for Exam (SYMPTOM  OR DIAGNOSIS REQUIRED)    Answer:   LUQ and LLQ abd discomfort    Order Specific Question:   Preferred imaging location?    Answer:   [facility]  . Comprehensive metabolic panel  . CBC  with Differential/Platelet  . Lipase  . POCT Urinalysis Dipstick (Automated)    Follow up plan: Return if symptoms worsen or fail to improve.  Ria Bush, MD

## 2017-10-20 ENCOUNTER — Ambulatory Visit: Payer: Medicare Other

## 2017-10-25 ENCOUNTER — Ambulatory Visit: Payer: Medicare Other

## 2017-11-29 DIAGNOSIS — H52223 Regular astigmatism, bilateral: Secondary | ICD-10-CM | POA: Diagnosis not present

## 2017-11-29 DIAGNOSIS — H269 Unspecified cataract: Secondary | ICD-10-CM | POA: Diagnosis not present

## 2017-11-29 DIAGNOSIS — H5203 Hypermetropia, bilateral: Secondary | ICD-10-CM | POA: Diagnosis not present

## 2017-11-29 DIAGNOSIS — H18413 Arcus senilis, bilateral: Secondary | ICD-10-CM | POA: Diagnosis not present

## 2017-11-29 DIAGNOSIS — H3589 Other specified retinal disorders: Secondary | ICD-10-CM | POA: Diagnosis not present

## 2017-11-29 DIAGNOSIS — H11423 Conjunctival edema, bilateral: Secondary | ICD-10-CM | POA: Diagnosis not present

## 2017-11-29 DIAGNOSIS — H40023 Open angle with borderline findings, high risk, bilateral: Secondary | ICD-10-CM | POA: Diagnosis not present

## 2017-11-29 DIAGNOSIS — H401134 Primary open-angle glaucoma, bilateral, indeterminate stage: Secondary | ICD-10-CM | POA: Diagnosis not present

## 2017-11-29 DIAGNOSIS — H40033 Anatomical narrow angle, bilateral: Secondary | ICD-10-CM | POA: Diagnosis not present

## 2017-11-29 DIAGNOSIS — H11153 Pinguecula, bilateral: Secondary | ICD-10-CM | POA: Diagnosis not present

## 2017-11-29 DIAGNOSIS — H2513 Age-related nuclear cataract, bilateral: Secondary | ICD-10-CM | POA: Diagnosis not present

## 2017-12-09 ENCOUNTER — Other Ambulatory Visit: Payer: Self-pay | Admitting: Cardiovascular Disease

## 2017-12-12 NOTE — Telephone Encounter (Signed)
Called Patient to verify how patient was currently taking Lasix.  Spoke with patient's Daughter [name].  She states Patient is taking Lasix 40MG  once a day.

## 2017-12-12 NOTE — Telephone Encounter (Signed)
I think he is taking lasix 40 mg once daily with some PRN lasix.  Please call to confirm. If he is taking this once daily, then let me know and I will change the order.  Thanks!

## 2017-12-12 NOTE — Telephone Encounter (Signed)
Per last phone note patient confirmed that he is taking Lasix 40 MG daily.   Please advise if these are the correct instructions I should be refilling,   Thanks !

## 2017-12-15 ENCOUNTER — Other Ambulatory Visit: Payer: Self-pay

## 2017-12-15 MED ORDER — OXYCODONE-ACETAMINOPHEN 10-325 MG PO TABS: 1.0000 | ORAL_TABLET | Freq: Three times a day (TID) | ORAL | 0 refills | Status: DC | PRN

## 2017-12-15 NOTE — Telephone Encounter (Signed)
Last filled 10/04/17 #90... Please advise

## 2017-12-25 ENCOUNTER — Other Ambulatory Visit: Payer: Self-pay | Admitting: Cardiovascular Disease

## 2018-01-24 ENCOUNTER — Ambulatory Visit (INDEPENDENT_AMBULATORY_CARE_PROVIDER_SITE_OTHER): Payer: Medicare Other | Admitting: Internal Medicine

## 2018-01-24 ENCOUNTER — Encounter: Payer: Self-pay | Admitting: Internal Medicine

## 2018-01-24 VITALS — BP 132/74 | HR 79 | Temp 98.5°F | Wt 213.0 lb

## 2018-01-24 DIAGNOSIS — R1012 Left upper quadrant pain: Secondary | ICD-10-CM

## 2018-01-24 DIAGNOSIS — R1033 Periumbilical pain: Secondary | ICD-10-CM

## 2018-01-24 DIAGNOSIS — Z85038 Personal history of other malignant neoplasm of large intestine: Secondary | ICD-10-CM | POA: Diagnosis not present

## 2018-01-24 MED ORDER — FENTANYL 12 MCG/HR TD PT72: 12.5000 ug | MEDICATED_PATCH | TRANSDERMAL | 0 refills | Status: DC

## 2018-01-25 ENCOUNTER — Encounter: Payer: Self-pay | Admitting: Internal Medicine

## 2018-01-25 NOTE — Progress Notes (Signed)
Subjective:    Patient ID: Jill Bowers, female    DOB: Feb 23, 1929, 84 y.o.   MRN: [id number]  HPI  Pt presents to the clinic today to discuss persist abdominal pain. She reports this has been going on for years, every since "they took that tumor out of my stomach". She describes the pain achy, sharp and pulling. The pain is around the belly button, but radiates into the left upper abdomen. The pain is constant. She denies nausea, vomiting or diarrhea. She denies blood in her stool. History of colon cancer s/p excision in 2015. Negative PET scan, negative colonoscopy but rising CEA. She is taking Percocet 3 x day without any relief. She saw Dr. Danise Mina for the same 09/2017. He ordered an abdominal ultrasound, but they never had this done. She follows with Dr. Burlene Arnt.  Review of Systems      Past Medical History:  Diagnosis Date  . CHF (congestive heart failure) ([place])   . Colon cancer ([place])   . Coronary artery disease   . DVT (deep venous thrombosis) ([place])   . GERD (gastroesophageal reflux disease)   . Hypertension   . Malignant neoplasm of stomach ([place])   . PE (pulmonary embolism)   . Renal disorder   . Renal insufficiency     Current Outpatient Medications  Medication Sig Dispense Refill  . Cholecalciferol (VITAMIN D-3) 1000 UNITS CAPS Take 2 capsules by mouth daily.    . furosemide (LASIX) 40 MG tablet Take 1 tablet (40 mg) by mouth once daily 30 tablet 3  . Magnesium Oxide 500 MG TABS Take 1 tablet by mouth 2 (two) times daily.    Marland Kitchen MEGARED OMEGA-3 KRILL OIL 500 MG CAPS Take by mouth.    Marland Kitchen omeprazole (PRILOSEC) 20 MG capsule Take 20 mg by mouth daily.    . Polyethylene Glycol 3350 (MIRALAX PO) Take by mouth daily.    Marland Kitchen spironolactone (ALDACTONE) 25 MG tablet TAKE 1 TABLET (25 MG TOTAL) BY MOUTH 2 (TWO) TIMES DAILY AS NEEDED. 180 tablet 0  . vitamin C (ASCORBIC ACID) 500 MG tablet Take 500 mg by mouth daily.    . fentaNYL (DURAGESIC - DOSED MCG/HR) 12 MCG/HR Place 1 patch  (12.5 mcg total) onto the skin every 3 (three) days. 10 patch 0   No current facility-administered medications for this visit.     Allergies  Allergen Reactions  . Gabapentin Other (See Comments)    Nightmares   . Reglan [Metoclopramide] Other (See Comments)  . Statins Other (See Comments)  . Torsemide Other (See Comments)    Family History  Problem Relation Age of Onset  . Arthritis Mother   . Hypertension Mother   . Colon cancer Sister   . Heart disease Daughter   . Colon cancer Son   . Pancreatic cancer Son     Social History   Socioeconomic History  . Marital status: Widowed    Spouse name: Not on file  . Number of children: Not on file  . Years of education: Not on file  . Highest education level: Not on file  Occupational History  . Not on file  Social Needs  . Financial resource strain: Not on file  . Food insecurity:    Worry: Not on file    Inability: Not on file  . Transportation needs:    Medical: Not on file    Non-medical: Not on file  Tobacco Use  . Smoking status: Never Smoker  . Smokeless tobacco: Never  Used  Substance and Sexual Activity  . Alcohol use: No  . Drug use: No  . Sexual activity: Never  Lifestyle  . Physical activity:    Days per week: Not on file    Minutes per session: Not on file  . Stress: Not on file  Relationships  . Social connections:    Talks on phone: Not on file    Gets together: Not on file    Attends religious service: Not on file    Active member of club or organization: Not on file    Attends meetings of clubs or organizations: Not on file    Relationship status: Not on file  . Intimate partner violence:    Fear of current or ex partner: Not on file    Emotionally abused: Not on file    Physically abused: Not on file    Forced sexual activity: Not on file  Other Topics Concern  . Not on file  Social History Narrative   Lives at home with daughter, has a walker     Constitutional: Denies fever,  malaise, fatigue, headache or abrupt weight changes.  Respiratory: Denies difficulty breathing, shortness of breath, cough or sputum production.   Cardiovascular: Denies chest pain, chest tightness, palpitations or swelling in the hands or feet.  Gastrointestinal: Pt reports abdominal pain. Denies bloating, constipation, diarrhea or blood in the stool.  GU: Denies urgency, frequency, pain with urination, burning sensation, blood in urine, odor or discharge.  No other specific complaints in a complete review of systems (except as listed in HPI above).  Objective:   Physical Exam   BP 132/74   Pulse 79   Temp 98.5 F (36.9 C) (Oral)   Wt 213 lb (96.6 kg)   SpO2 96%   BMI 37.73 kg/m  Wt Readings from Last 3 Encounters:  01/24/18 213 lb (96.6 kg)  10/12/17 212 lb 8 oz (96.4 kg)  08/09/17 217 lb (98.4 kg)    General: Appears her stated age, obese, chronically ill appearing, in NAD. Cardiovascular: Normal rate and rhythm.  Pulmonary/Chest: Normal effort and positive vesicular breath sounds. No respiratory distress. No wheezes, rales or ronchi noted.  Abdomen: Soft and tender over the scar above the periumbilical area.. Normal bowel sounds. No distention or masses noted.  BMET    Component Value Date/Time   NA 142 10/12/2017 1317   NA 141 09/11/2015 0920   K 4.5 10/12/2017 1317   CL 101 10/12/2017 1317   CO2 33 (H) 10/12/2017 1317   GLUCOSE 114 (H) 10/12/2017 1317   BUN 15 10/12/2017 1317   BUN 25 09/11/2015 0920   CREATININE 1.17 10/12/2017 1317   CALCIUM 9.9 10/12/2017 1317   GFRNONAA 46 (L) 08/05/2017 1356   GFRAA 53 (L) 08/05/2017 1356    Lipid Panel     Component Value Date/Time   CHOL 172 06/12/2015 1125   TRIG 155.0 (H) 06/12/2015 1125   HDL 52.60 06/12/2015 1125   CHOLHDL 3 06/12/2015 1125   VLDL 31.0 06/12/2015 1125   LDLCALC 89 06/12/2015 1125    CBC    Component Value Date/Time   WBC 5.0 10/12/2017 1317   RBC 4.47 10/12/2017 1317   HGB 13.6  10/12/2017 1317   HCT 41.1 10/12/2017 1317   PLT 225.0 10/12/2017 1317   MCV 91.9 10/12/2017 1317   MCH 30.2 08/05/2017 1356   MCHC 33.2 10/12/2017 1317   RDW 13.9 10/12/2017 1317   LYMPHSABS 1.9 10/12/2017 1317   MONOABS  0.5 10/12/2017 1317   EOSABS 0.1 10/12/2017 1317   BASOSABS 0.0 10/12/2017 1317    Hgb A1C Lab Results  Component Value Date   HGBA1C 5.8 06/12/2015           Assessment & Plan:   Abdominal Pain, Hx of Colon Cancer:  Will stop Percocet, not effective eRx for Fentanyl Patch 12.5 mg, every 3 days RUQ Ultrasound ordered  Will follow up after ultrasound, return precautions discussed Webb Silversmith, NP

## 2018-01-25 NOTE — Patient Instructions (Signed)

## 2018-01-26 ENCOUNTER — Ambulatory Visit: Payer: Medicare Other

## 2018-01-26 ENCOUNTER — Telehealth: Payer: Self-pay | Admitting: Internal Medicine

## 2018-01-26 NOTE — Telephone Encounter (Signed)
Copied from [place] [id number]. Topic: Quick Communication - See Telephone Encounter >> Jan 26, 2018  9:05 AM Synthia Innocent wrote: CRM for notification. See Telephone encounter for: 01/26/18. Per daughter pain patch is not working, still in a lot of pain. Please advise

## 2018-01-26 NOTE — Telephone Encounter (Signed)
Put on 2 pain patches at one time, when the next patch is due. Then update me.

## 2018-01-27 NOTE — Telephone Encounter (Signed)
Pt's daughter states the next patch is not due until tomorrow, but is there anything that pt can take today orally to help as she is still in pain

## 2018-01-27 NOTE — Telephone Encounter (Signed)
She has Oxycodone 10mg  left

## 2018-01-27 NOTE — Telephone Encounter (Signed)
Left detailed msg on VM per HIPAA  

## 2018-01-27 NOTE — Telephone Encounter (Signed)
Can take that but I would try breaking them in half.

## 2018-01-27 NOTE — Telephone Encounter (Signed)
Does she have any of the Percocet left?

## 2018-01-30 ENCOUNTER — Encounter: Payer: Self-pay | Admitting: Emergency Medicine

## 2018-01-30 ENCOUNTER — Emergency Department: Payer: Medicare Other

## 2018-01-30 ENCOUNTER — Other Ambulatory Visit: Payer: Self-pay

## 2018-01-30 ENCOUNTER — Inpatient Hospital Stay
Admission: EM | Admit: 2018-01-30 | Discharge: 2018-02-04 | DRG: 378 | Disposition: A | Discharge status: Home Health | Payer: Medicare Other | Attending: Family Medicine | Admitting: Family Medicine

## 2018-01-30 DIAGNOSIS — M4807 Spinal stenosis, lumbosacral region: Secondary | ICD-10-CM | POA: Diagnosis present

## 2018-01-30 DIAGNOSIS — Z85038 Personal history of other malignant neoplasm of large intestine: Secondary | ICD-10-CM | POA: Diagnosis not present

## 2018-01-30 DIAGNOSIS — I5032 Chronic diastolic (congestive) heart failure: Secondary | ICD-10-CM | POA: Diagnosis not present

## 2018-01-30 DIAGNOSIS — Z86711 Personal history of pulmonary embolism: Secondary | ICD-10-CM

## 2018-01-30 DIAGNOSIS — K319 Disease of stomach and duodenum, unspecified: Secondary | ICD-10-CM | POA: Diagnosis present

## 2018-01-30 DIAGNOSIS — K5730 Diverticulosis of large intestine without perforation or abscess without bleeding: Secondary | ICD-10-CM | POA: Diagnosis not present

## 2018-01-30 DIAGNOSIS — Z86718 Personal history of other venous thrombosis and embolism: Secondary | ICD-10-CM | POA: Diagnosis not present

## 2018-01-30 DIAGNOSIS — Z8261 Family history of arthritis: Secondary | ICD-10-CM | POA: Diagnosis not present

## 2018-01-30 DIAGNOSIS — K219 Gastro-esophageal reflux disease without esophagitis: Secondary | ICD-10-CM | POA: Diagnosis present

## 2018-01-30 DIAGNOSIS — E860 Dehydration: Secondary | ICD-10-CM | POA: Diagnosis present

## 2018-01-30 DIAGNOSIS — R1084 Generalized abdominal pain: Secondary | ICD-10-CM | POA: Diagnosis not present

## 2018-01-30 DIAGNOSIS — K5731 Diverticulosis of large intestine without perforation or abscess with bleeding: Principal | ICD-10-CM | POA: Diagnosis present

## 2018-01-30 DIAGNOSIS — R197 Diarrhea, unspecified: Secondary | ICD-10-CM | POA: Diagnosis not present

## 2018-01-30 DIAGNOSIS — K922 Gastrointestinal hemorrhage, unspecified: Secondary | ICD-10-CM

## 2018-01-30 DIAGNOSIS — R55 Syncope and collapse: Secondary | ICD-10-CM | POA: Diagnosis not present

## 2018-01-30 DIAGNOSIS — E875 Hyperkalemia: Secondary | ICD-10-CM | POA: Diagnosis present

## 2018-01-30 DIAGNOSIS — K921 Melena: Secondary | ICD-10-CM

## 2018-01-30 DIAGNOSIS — K625 Hemorrhage of anus and rectum: Secondary | ICD-10-CM

## 2018-01-30 DIAGNOSIS — Z8249 Family history of ischemic heart disease and other diseases of the circulatory system: Secondary | ICD-10-CM | POA: Diagnosis not present

## 2018-01-30 DIAGNOSIS — I10 Essential (primary) hypertension: Secondary | ICD-10-CM | POA: Diagnosis not present

## 2018-01-30 DIAGNOSIS — Z800 Family history of malignant neoplasm of digestive organs: Secondary | ICD-10-CM

## 2018-01-30 DIAGNOSIS — N289 Disorder of kidney and ureter, unspecified: Secondary | ICD-10-CM | POA: Diagnosis present

## 2018-01-30 DIAGNOSIS — K317 Polyp of stomach and duodenum: Secondary | ICD-10-CM

## 2018-01-30 DIAGNOSIS — K648 Other hemorrhoids: Secondary | ICD-10-CM | POA: Diagnosis present

## 2018-01-30 DIAGNOSIS — C779 Secondary and unspecified malignant neoplasm of lymph node, unspecified: Secondary | ICD-10-CM | POA: Diagnosis present

## 2018-01-30 DIAGNOSIS — I110 Hypertensive heart disease with heart failure: Secondary | ICD-10-CM | POA: Diagnosis present

## 2018-01-30 DIAGNOSIS — R0789 Other chest pain: Secondary | ICD-10-CM | POA: Diagnosis present

## 2018-01-30 DIAGNOSIS — Z66 Do not resuscitate: Secondary | ICD-10-CM | POA: Diagnosis not present

## 2018-01-30 DIAGNOSIS — K5790 Diverticulosis of intestine, part unspecified, without perforation or abscess without bleeding: Secondary | ICD-10-CM | POA: Diagnosis not present

## 2018-01-30 DIAGNOSIS — K2970 Gastritis, unspecified, without bleeding: Secondary | ICD-10-CM | POA: Diagnosis not present

## 2018-01-30 DIAGNOSIS — R109 Unspecified abdominal pain: Secondary | ICD-10-CM

## 2018-01-30 DIAGNOSIS — R52 Pain, unspecified: Secondary | ICD-10-CM | POA: Diagnosis not present

## 2018-01-30 DIAGNOSIS — R0902 Hypoxemia: Secondary | ICD-10-CM | POA: Diagnosis not present

## 2018-01-30 DIAGNOSIS — I639 Cerebral infarction, unspecified: Secondary | ICD-10-CM

## 2018-01-30 LAB — COMPREHENSIVE METABOLIC PANEL
ALBUMIN: 3.5 g/dL (ref 3.5–5.0)
ALT: 7 U/L (ref 0–44)
ANION GAP: 6 (ref 5–15)
AST: 15 U/L (ref 15–41)
Alkaline Phosphatase: 58 U/L (ref 38–126)
BUN: 22 mg/dL (ref 8–23)
CALCIUM: 8.8 mg/dL — AB (ref 8.9–10.3)
CHLORIDE: 104 mmol/L (ref 98–111)
CO2: 32 mmol/L (ref 22–32)
CREATININE: 1.19 mg/dL — AB (ref 0.44–1.00)
GFR calc Af Amer: 47 mL/min — ABNORMAL LOW (ref >60)
GFR calc non Af Amer: 40 mL/min — ABNORMAL LOW (ref >60)
GLUCOSE: 132 mg/dL — AB (ref 70–99)
Potassium: 5.3 mmol/L — ABNORMAL HIGH (ref 3.5–5.1)
SODIUM: 142 mmol/L (ref 135–145)
Total Bilirubin: 0.5 mg/dL (ref 0.3–1.2)
Total Protein: 6.4 g/dL — ABNORMAL LOW (ref 6.5–8.1)

## 2018-01-30 LAB — CBC
HCT: 35.3 % (ref 35.0–47.0)
HEMOGLOBIN: 12 g/dL (ref 12.0–16.0)
MCH: 30.8 pg (ref 26.0–34.0)
MCHC: 33.9 g/dL (ref 32.0–36.0)
MCV: 90.9 fL (ref 80.0–100.0)
Platelets: 266 10*3/uL (ref 150–440)
RBC: 3.89 MIL/uL (ref 3.80–5.20)
RDW: 13.9 % (ref 11.5–14.5)
WBC: 6.7 10*3/uL (ref 3.6–11.0)

## 2018-01-30 LAB — HEMOGLOBIN: Hemoglobin: 11 g/dL — ABNORMAL LOW (ref 12.0–16.0)

## 2018-01-30 LAB — APTT: aPTT: 31 seconds (ref 24–36)

## 2018-01-30 LAB — PROTIME-INR
INR: 1.11
Prothrombin Time: 14.2 seconds (ref 11.4–15.2)

## 2018-01-30 LAB — PREPARE RBC (CROSSMATCH)

## 2018-01-30 MED ORDER — ONDANSETRON HCL 4 MG/2ML IJ SOLN: 4.0000 mg | Freq: Four times a day (QID) | INTRAMUSCULAR | Status: DC | PRN

## 2018-01-30 MED ORDER — ALBUTEROL SULFATE (2.5 MG/3ML) 0.083% IN NEBU: 2.5000 mg | INHALATION_SOLUTION | RESPIRATORY_TRACT | Status: DC | PRN

## 2018-01-30 MED ORDER — FENTANYL 12 MCG/HR TD PT72: 12.5000 ug | MEDICATED_PATCH | TRANSDERMAL | Status: DC

## 2018-01-30 MED ORDER — SODIUM CHLORIDE 0.9 % IV SOLN
10.0000 mL/h | Freq: Once | INTRAVENOUS | Status: AC
  Administered 2018-01-31: 10 mL/h via INTRAVENOUS

## 2018-01-30 MED ORDER — SENNOSIDES-DOCUSATE SODIUM 8.6-50 MG PO TABS: 1.0000 | ORAL_TABLET | Freq: Every evening | ORAL | Status: DC | PRN

## 2018-01-30 MED ORDER — MAGNESIUM OXIDE -MG SUPPLEMENT 500 MG PO TABS: 1.0000 | ORAL_TABLET | Freq: Two times a day (BID) | ORAL | Status: DC

## 2018-01-30 MED ORDER — IOHEXOL 300 MG/ML  SOLN
75.0000 mL | Freq: Once | INTRAMUSCULAR | Status: AC | PRN
  Administered 2018-01-30: 75 mL via INTRAVENOUS

## 2018-01-30 MED ORDER — ACETAMINOPHEN 325 MG PO TABS: 650.0000 mg | ORAL_TABLET | Freq: Four times a day (QID) | ORAL | Status: DC | PRN

## 2018-01-30 MED ORDER — ONDANSETRON HCL 4 MG PO TABS: 4.0000 mg | ORAL_TABLET | Freq: Four times a day (QID) | ORAL | Status: DC | PRN

## 2018-01-30 MED ORDER — FUROSEMIDE 40 MG PO TABS
40.0000 mg | ORAL_TABLET | Freq: Every day | ORAL | Status: DC
  Filled 2018-01-30: qty 1

## 2018-01-30 MED ORDER — TRANEXAMIC ACID 1000 MG/10ML IV SOLN
1000.0000 mg | INTRAVENOUS | Status: AC
  Administered 2018-01-31: 1000 mg via INTRAVENOUS
  Filled 2018-01-30: qty 1000

## 2018-01-30 MED ORDER — ACETAMINOPHEN 650 MG RE SUPP: 650.0000 mg | Freq: Four times a day (QID) | RECTAL | Status: DC | PRN

## 2018-01-30 MED ORDER — SODIUM CHLORIDE 0.9 % IV SOLN
INTRAVENOUS | Status: DC
  Administered 2018-01-31: via INTRAVENOUS

## 2018-01-30 MED ORDER — HYDROCODONE-ACETAMINOPHEN 5-325 MG PO TABS
1.0000 | ORAL_TABLET | ORAL | Status: DC | PRN
  Administered 2018-01-31: 1 via ORAL
  Administered 2018-02-01 – 2018-02-02 (×2): 2 via ORAL
  Administered 2018-02-02: 1 via ORAL
  Administered 2018-02-02 – 2018-02-03 (×2): 2 via ORAL
  Administered 2018-02-03: 1 via ORAL
  Administered 2018-02-03 – 2018-02-04 (×4): 2 via ORAL
  Filled 2018-01-30: qty 1
  Filled 2018-01-30 (×3): qty 2
  Filled 2018-01-30: qty 1
  Filled 2018-01-30 (×6): qty 2
  Filled 2018-01-30: qty 1

## 2018-01-30 MED ORDER — BISACODYL 5 MG PO TBEC: 5.0000 mg | DELAYED_RELEASE_TABLET | Freq: Every day | ORAL | Status: DC | PRN

## 2018-01-30 MED ORDER — SODIUM CHLORIDE 0.9 % IV BOLUS
500.0000 mL | Freq: Once | INTRAVENOUS | Status: AC
  Administered 2018-01-30: 500 mL via INTRAVENOUS

## 2018-01-30 MED ORDER — MAGNESIUM OXIDE 400 (241.3 MG) MG PO TABS
400.0000 mg | ORAL_TABLET | Freq: Two times a day (BID) | ORAL | Status: DC
  Administered 2018-01-31 – 2018-02-04 (×9): 400 mg via ORAL
  Filled 2018-01-30 (×9): qty 1

## 2018-01-30 NOTE — Progress Notes (Signed)
Advanced Care Plan.  Purpose of Encounter: CODE STATUS. Parties in Attendance: Patient and me. Patient's Decisional Capacity: Yes. Medical Story: Jill Bowers  is a 84 y.o. female with a known history of hypertension, CHF, CAD, colon cancer, DVT, PE and renal insufficiency.  The patient is being admitted for rectal bleeding.  I discussed the patient's current condition, prognosis and CODE STATUS with the patient.  The patient does not want to be resuscitated or intubated.  She said she already talk to her children about her wish. Plan:  Code Status: DNR. Time spent discussing advance care planning: 17 minutes.

## 2018-01-30 NOTE — ED Notes (Signed)
Dr Bridgett Larsson in to see pt

## 2018-01-30 NOTE — ED Notes (Signed)
Pt & family voice good understanding of possible blood transfusion and consent; consent signed by pt

## 2018-01-30 NOTE — ED Provider Notes (Signed)
[facility] Emergency Department Provider Note  ____________________________________________   First MD Initiated Contact with Patient 01/30/18 1845     (approximate)  I have reviewed the triage vital signs and the nursing notes.   HISTORY  Chief Complaint GI Bleeding    HPI Jill Bowers is a 84 y.o. female with extensive past medical history including colon cancer and prior GI bleeds who presents for evaluation of acute onset and severe bright red blood per rectum as well as some dark blood clots.  She is also having some severe but intermittent aching and cramping lower abdominal pain.  She is currently not feeling of the pain but she got fentanyl 100 mcg IV in route by EMS.  She was in her normal state of health yesterday but the symptoms started today.  She has had at least 4 episodes of bright red blood in the commode and it has been large volume.  She denies fever/chills, chest pain, shortness of breath, nausea, and vomiting.  Nothing makes her symptoms better or worse.  She does not feel  Lightheaded or dizzy and has not had any episodes of passing out.  She takes no anticoagulation or blood thinning medications.  No new or different medications recently.   Past Medical History:  Diagnosis Date  . CHF (congestive heart failure) ([place])   . Colon cancer ([place])   . Coronary artery disease   . DVT (deep venous thrombosis) ([place])   . GERD (gastroesophageal reflux disease)   . Hypertension   . Malignant neoplasm of stomach ([place])   . PE (pulmonary embolism)   . Renal disorder   . Renal insufficiency     Patient Active Problem List   Diagnosis Date Noted  . Left sided abdominal pain 10/12/2017  . Osteoarthritis 08/09/2017  . Chronic diastolic congestive heart failure ([place]) 01/27/2017  . PAD (peripheral artery disease) ([place]) 07/14/2015  . Essential hypertension 06/12/2015  . History of DVT 06/12/2015  . History of pulmonary embolus (PE) 06/12/2015  .  GERD (gastroesophageal reflux disease) 06/12/2015  . Colon cancer metastasized to intra-abdominal lymph node ([place]) 06/02/2015    Past Surgical History:  Procedure Laterality Date  . ABDOMINAL HYSTERECTOMY    . COLON SURGERY    . COLONOSCOPY WITH PROPOFOL N/A 08/23/2016   Procedure: COLONOSCOPY WITH PROPOFOL;  Surgeon: Jonathon Bellows, MD;  Location: [facility] ENDOSCOPY;  Service: Gastroenterology;  Laterality: N/A;  . COLONOSCOPY WITH PROPOFOL N/A 08/24/2016   Procedure: COLONOSCOPY WITH PROPOFOL;  Surgeon: Jonathon Bellows, MD;  Location: [facility] ENDOSCOPY;  Service: Endoscopy;  Laterality: N/A;  . IVC FILTER PLACEMENT ([place] HX)    . JOINT REPLACEMENT Right    knee    Prior to Admission medications   Medication Sig Start Date End Date Taking? Authorizing Provider  Cholecalciferol (VITAMIN D-3) 1000 UNITS CAPS Take 2 capsules by mouth daily.    [provider]  fentaNYL (DURAGESIC - DOSED MCG/HR) 12 MCG/HR Place 1 patch (12.5 mcg total) onto the skin every 3 (three) days. 01/24/18   Jearld Fenton, NP  furosemide (LASIX) 40 MG tablet Take 1 tablet (40 mg) by mouth once daily 12/14/17   Minna Merritts, MD  Magnesium Oxide 500 MG TABS Take 1 tablet by mouth 2 (two) times daily.    [provider]  MEGARED OMEGA-3 KRILL OIL 500 MG CAPS Take by mouth.    [provider]  omeprazole (PRILOSEC) 20 MG capsule Take 20 mg by mouth daily.  [provider]  Polyethylene Glycol 3350 (MIRALAX PO) Take by mouth daily.    [provider]  spironolactone (ALDACTONE) 25 MG tablet TAKE 1 TABLET (25 MG TOTAL) BY MOUTH 2 (TWO) TIMES DAILY AS NEEDED. 12/26/17   Minna Merritts, MD  vitamin C (ASCORBIC ACID) 500 MG tablet Take 500 mg by mouth daily.    [provider]    Allergies Gabapentin; Reglan [metoclopramide]; Statins; and Torsemide  Family History  Problem Relation Age of Onset  . Arthritis Mother   . Hypertension Mother   . Colon cancer Sister   . Heart  disease Daughter   . Colon cancer Son   . Pancreatic cancer Son     Social History Social History   Tobacco Use  . Smoking status: Never Smoker  . Smokeless tobacco: Never Used  Substance Use Topics  . Alcohol use: No  . Drug use: No    Review of Systems Constitutional: No fever/chills Eyes: No visual changes. ENT: No sore throat. Cardiovascular: Denies chest pain.  No lightheadedness or dizziness.  No near syncope nor syncope. Respiratory: Denies shortness of breath. Gastrointestinal: Acute onset of bright red blood per rectum multiple times today, large volume, accompanied by aching and cramping lower abdominal pain.  No nausea nor vomiting. Genitourinary: Negative for dysuria. Musculoskeletal: Negative for neck pain.  Negative for back pain. Integumentary: Negative for rash. Neurological: Negative for headaches, focal weakness or numbness.   ____________________________________________   PHYSICAL EXAM:  VITAL SIGNS: ED Triage Vitals  Enc Vitals Group     BP 01/30/18 1823 (!) 141/56     Pulse Rate 01/30/18 1823 63     Resp 01/30/18 1823 16     Temp 01/30/18 1823 98.6 F (37 C)     Temp Source 01/30/18 1823 Oral     SpO2 01/30/18 1823 96 %     Weight 01/30/18 1824 97.5 kg (215 lb)     Height --      Head Circumference --      Peak Flow --      Pain Score 01/30/18 1823 8     Pain Loc --      Pain Edu? --      Excl. in [place]? --     Constitutional: Alert and oriented.  Elderly but no acute distress at this time. Eyes: Conjunctivae are normal.  Head: Atraumatic. Nose: No congestion/rhinnorhea. Mouth/Throat: Mucous membranes are moist. Neck: No stridor.  No meningeal signs.   Cardiovascular: Normal rate, regular rhythm. Good peripheral circulation. Grossly normal heart sounds. Respiratory: Normal respiratory effort.  No retractions. Lungs CTAB. Gastrointestinal: Obese, soft, nondistended.  Diffuse mild tenderness to palpation throughout with no rebound or  guarding. Musculoskeletal: No lower extremity tenderness nor edema. No gross deformities of extremities. Neurologic:  Normal speech and language. No gross focal neurologic deficits are appreciated.  Skin:  Skin is warm, dry and intact. No rash noted. Psychiatric: Mood and affect are normal. Speech and behavior are normal.  ____________________________________________   LABS (all labs ordered are listed, but only abnormal results are displayed)  Labs Reviewed  COMPREHENSIVE METABOLIC PANEL - Abnormal; Notable for the following components:      Result Value   Potassium 5.3 (*)    Glucose, Bld 132 (*)    Creatinine, Ser 1.19 (*)    Calcium 8.8 (*)    Total Protein 6.4 (*)    GFR calc non Af Amer 40 (*)    GFR calc Af Amer 47 (*)  All other components within normal limits  CBC  PROTIME-INR  APTT  PROTIME-INR  APTT  POC OCCULT BLOOD, ED  TYPE AND SCREEN  PREPARE RBC (CROSSMATCH)   ____________________________________________  EKG  ED ECG REPORT I, Hinda Kehr, the attending physician, personally viewed and interpreted this ECG.  Date: 01/30/2018 EKG Time: 18: 26 Rate: 65 Rhythm: normal sinus rhythm QRS Axis: normal Intervals: normal ST/T Wave abnormalities: Non-specific ST segment / T-wave changes, but no evidence of acute ischemia. Narrative Interpretation: no evidence of acute ischemia   ____________________________________________  RADIOLOGY   ED MD interpretation:  extensive diverticulosis  Official radiology report(s): Ct Abdomen Pelvis W Contrast  Result Date: 01/30/2018 CLINICAL DATA:  84 year old female with history of bright red blood per rectum today. History of colon cancer status post colonic resection. EXAM: CT ABDOMEN AND PELVIS WITH CONTRAST TECHNIQUE: Multidetector CT imaging of the abdomen and pelvis was performed using the standard protocol following bolus administration of intravenous contrast. CONTRAST:  70mL OMNIPAQUE IOHEXOL 300 MG/ML  SOLN  COMPARISON:  PET/CT 08/03/2017. FINDINGS: Lower chest: Heart size is mildly enlarged. Markedly elevated right hemidiaphragm. Hepatobiliary: There are multiple well-defined low-attenuation lesions in the liver, largest of which measures up to 3.3 x 2.0 cm adjacent to the gallbladder fossa in segments 5/8 (axial image 20 of series 3), compatible with simple cysts. Other subcentimeter low-attenuation lesions in the liver are too small to definitively characterize, but are also favored to represent small cysts. No suspicious hepatic lesions. No intra or extrahepatic biliary ductal dilatation. Gallbladder is normal in appearance. Pancreas: Multiple low-attenuation lesions are scattered throughout the pancreas, most of which are subcentimeter in size. The largest of these lesions is in the inferior aspect of the head of the pancreas measuring 11 x 20 mm (axial image 43 of series 3). These appear similar in size, number and distribution to the prior examination. Mild ectasia of the pancreatic duct. No peripancreatic fluid collections or inflammatory changes. Spleen: Unremarkable. Adrenals/Urinary Tract: 1.9 cm low-attenuation lesion in the upper pole of the right kidney is compatible with a simple cyst. Multifocal cortical scarring in the left kidney. No suspicious renal lesions. No hydroureteronephrosis. Urinary bladder is normal in appearance. Bilateral adrenal glands are normal in appearance. Stomach/Bowel: Normal appearance of the stomach. No pathologic dilatation of small bowel or colon. Numerous colonic diverticulae are noted, particularly in the sigmoid colon, without definite surrounding inflammatory changes to suggest an acute diverticulitis at this time. Postoperative changes in the region of the cecum. Status post appendectomy. Vascular/Lymphatic: Aortic atherosclerosis, without evidence of aneurysm or dissection in the abdominal or pelvic vasculature. IVC filter in place with tip well below the level of the  renal veins. No lymphadenopathy noted in the abdomen or pelvis. Reproductive: Multiple coarse calcifications are again noted in the uterus, presumably calcified fibroids. Ovaries are unremarkable in appearance. Other: Small left inguinal hernia containing only fat. No significant volume of ascites. No pneumoperitoneum. Musculoskeletal: There are no aggressive appearing lytic or blastic lesions noted in the visualized portions of the skeleton. IMPRESSION: 1. Severe colonic diverticulosis. No surrounding inflammatory changes are appreciated at this time to indicate an acute diverticulitis. 2. Fibroid uterus. 3. Aortic atherosclerosis. 4. Multiple small low-attenuation lesions scattered throughout the pancreas, similar in size, number and pattern of distribution to prior studies dating back to at least 12/01/2015. Given the stability of these lesions and the lack of interval growth, in a patient greater than 73 years of age, these are presumed to be benign and no  future imaging followup is recommended. This recommendation follows ACR consensus guidelines: Management of Incidental Pancreatic Cysts: A White Paper of the ACR Incidental Findings Committee. [place] 7782;42:[id number]. 5. Additional incidental findings, as above. Electronically Signed   By: Vinnie Langton M.D.   On: 01/30/2018 20:39    ____________________________________________   PROCEDURES  Critical Care performed: No   Procedure(s) performed:   Procedures   ____________________________________________   INITIAL IMPRESSION / ASSESSMENT AND PLAN / ED COURSE  As part of my medical decision making, I reviewed the following data within the [place] History obtained from family, Nursing notes reviewed and incorporated, Labs reviewed , EKG interpreted , Old chart reviewed, Discussed with admitting physician  and Notes from prior ED visits    Differential diagnosis includes, but is not limited to, recurrent  neoplasm, AV malformation, diverticulosis, infectious colitis.  I suspect that the patient is either having a diverticular bleed or issues from her prior or recurrent neoplasm.  She is in no acute distress at this time and she is hemodynamically stable in spite of the significant blood loss which I visualized in the bedpan (there is no need to perform a digital rectal exam because the patient passed a large amount of blood in the bedpan immediately prior to me coming into the room).  She declines any pain medicine at this time.  I am giving her a small fluid bolus for her slightly decreased GFR and am going to get a CT of the abdomen and pelvis with IV contrast only (reduce dye load) to see if there is any acute surgical issue or indication of a colonic mass that might benefit from surgical consultation.  Otherwise I had my usual and customary blood products consent discussion with the patient and her daughter and they both agreed to receiving blood products and provided consent.  I have ordered crossmatch of 2 units but there is no indication for transfusion at this time although I suspect she may need a transfusion before the end of this hospitalization.  I will admit her to the hospital after the CT scan results are back.  The patient and her family understand and agree with the plan.  Lab work is notable for a stable hemoglobin of 12, and essentially normal conference of metabolic panel but with slightly elevated creatinine and a GFR of 47 and a slightly elevated potassium of 5.3 with which to the IV bolus of 500 mL of normal saline should help.  I am also ordering tranexamic acid 1 g IV to try to slow down the bleeding.   Clinical Course as of Jan 31 2107  Mon Jan 30, 2018  2042 Severe diverticulosis.  Will discuss with hospitalist.  [place] [CF]  2049 Discussed with Dr. Bridgett Larsson of the hospitalist service who will admit. Also updated family.    [CF]    Clinical Course User  Index [CF] Hinda Kehr, MD    ____________________________________________  FINAL CLINICAL IMPRESSION(S) / ED DIAGNOSES  Final diagnoses:  Rectal bleeding  Diverticulosis     MEDICATIONS GIVEN DURING THIS VISIT:  Medications  0.9 %  sodium chloride infusion (has no administration in time range)  sodium chloride 0.9 % bolus 500 mL (500 mLs Intravenous New Bag/Given 01/30/18 2037)  tranexamic acid (CYKLOKAPRON) 1,000 mg in sodium chloride 0.9 % 100 mL IVPB (has no administration in time range)  iohexol (OMNIPAQUE) 300 MG/ML solution 75 mL (75 mLs Intravenous Contrast Given 01/30/18 1952)  ED Discharge Orders    None       Note:  This document was prepared using Dragon voice recognition software and may include unintentional dictation errors.    Hinda Kehr, MD 01/30/18 2108

## 2018-01-30 NOTE — ED Triage Notes (Addendum)
Pt to ED via EMS from home with c/o GI bleed today, family states dark and bright red blood clots in toilet. Hx of GI bleed. PT A&OX3, VSS. PT received 184mcg fentanyl in route by ems for abd pain.

## 2018-01-30 NOTE — ED Notes (Signed)
incont large amount urine and small amount bright rectal bleeding; pt clensed and attends changed

## 2018-01-30 NOTE — ED Notes (Signed)
ED Provider at bedside. 

## 2018-01-30 NOTE — H&P (Signed)
[facility] [place] NAME: Jill Bowers    MR#:  [id number]  DATE OF BIRTH:  [birth year]  DATE OF ADMISSION:  01/30/2018  PRIMARY CARE PHYSICIAN: Jearld Fenton, NP   REQUESTING/REFERRING PHYSICIAN: Dr. Karma Greaser.  CHIEF COMPLAINT:   Chief Complaint  Patient presents with  . GI Bleeding   Rectal bleeding today. HISTORY OF PRESENT ILLNESS:  Jill Bowers  is a 84 y.o. female with a known history of hypertension, CHF, CAD, colon cancer, DVT, PE and renal insufficiency.  The patient was sent to ED due to rectal bleeding today. She also complains of abdominal discomfort and pain in the middle part.  She has had 4 episodes of a large rectal bleeding the ED.  CT of the abdomen show severe diverticulosis. PAST MEDICAL HISTORY:   Past Medical History:  Diagnosis Date  . CHF (congestive heart failure) ([place])   . Colon cancer ([place])   . Coronary artery disease   . DVT (deep venous thrombosis) ([place])   . GERD (gastroesophageal reflux disease)   . Hypertension   . Malignant neoplasm of stomach ([place])   . PE (pulmonary embolism)   . Renal disorder   . Renal insufficiency     PAST SURGICAL HISTORY:   Past Surgical History:  Procedure Laterality Date  . ABDOMINAL HYSTERECTOMY    . COLON SURGERY    . COLONOSCOPY WITH PROPOFOL N/A 08/23/2016   Procedure: COLONOSCOPY WITH PROPOFOL;  Surgeon: Jonathon Bellows, MD;  Location: [facility] ENDOSCOPY;  Service: Gastroenterology;  Laterality: N/A;  . COLONOSCOPY WITH PROPOFOL N/A 08/24/2016   Procedure: COLONOSCOPY WITH PROPOFOL;  Surgeon: Jonathon Bellows, MD;  Location: [facility] ENDOSCOPY;  Service: Endoscopy;  Laterality: N/A;  . IVC FILTER PLACEMENT ([place] HX)    . JOINT REPLACEMENT Right    knee    SOCIAL HISTORY:   Social History   Tobacco Use  . Smoking status: Never Smoker  . Smokeless tobacco: Never Used  Substance Use Topics  . Alcohol use: No    FAMILY HISTORY:   Family History  Problem Relation Age of  Onset  . Arthritis Mother   . Hypertension Mother   . Colon cancer Sister   . Heart disease Daughter   . Colon cancer Son   . Pancreatic cancer Son     DRUG ALLERGIES:   Allergies  Allergen Reactions  . Gabapentin Other (See Comments)    Nightmares   . Reglan [Metoclopramide] Other (See Comments)  . Statins Other (See Comments)  . Torsemide Other (See Comments)    REVIEW OF SYSTEMS:   Review of Systems  Constitutional: Positive for malaise/fatigue. Negative for chills and fever.  HENT: Negative for sore throat.   Eyes: Negative for blurred vision and double vision.  Respiratory: Negative for cough, hemoptysis, shortness of breath, wheezing and stridor.   Cardiovascular: Negative for chest pain, palpitations, orthopnea and leg swelling.  Gastrointestinal: Positive for abdominal pain, blood in stool and nausea. Negative for diarrhea, melena and vomiting.  Genitourinary: Negative for dysuria, flank pain and hematuria.  Musculoskeletal: Negative for back pain and joint pain.  Skin: Negative for rash.  Neurological: Negative for dizziness, sensory change, focal weakness, seizures, loss of consciousness, weakness and headaches.  Endo/Heme/Allergies: Negative for polydipsia.  Psychiatric/Behavioral: Negative for depression. The patient is not nervous/anxious.     MEDICATIONS AT HOME:   Prior to Admission medications   Medication Sig Start Date End Date Taking? Authorizing Provider  Cholecalciferol (VITAMIN D-3)  1000 UNITS CAPS Take 2 capsules by mouth daily.   Yes [provider]  fentaNYL (DURAGESIC - DOSED MCG/HR) 12 MCG/HR Place 1 patch (12.5 mcg total) onto the skin every 3 (three) days. 01/24/18  Yes Jearld Fenton, NP  furosemide (LASIX) 40 MG tablet Take 1 tablet (40 mg) by mouth once daily 12/14/17  Yes Gollan, Kathlene November, MD  Magnesium Oxide 500 MG TABS Take 1 tablet by mouth 2 (two) times daily.   Yes [provider]  MEGARED OMEGA-3 KRILL OIL 500 MG  CAPS Take by mouth.   Yes [provider]  omeprazole (PRILOSEC) 20 MG capsule Take 20 mg by mouth daily.   Yes [provider]  Polyethylene Glycol 3350 (MIRALAX PO) Take by mouth daily.   Yes [provider]  spironolactone (ALDACTONE) 25 MG tablet TAKE 1 TABLET (25 MG TOTAL) BY MOUTH 2 (TWO) TIMES DAILY AS NEEDED. 12/26/17  Yes Gollan, Kathlene November, MD  vitamin C (ASCORBIC ACID) 500 MG tablet Take 500 mg by mouth daily.   Yes [provider]      VITAL SIGNS:  Blood pressure (!) 141/56, pulse 63, temperature 98.6 F (37 C), temperature source Oral, resp. rate 16, weight 215 lb (97.5 kg), SpO2 96 %.  PHYSICAL EXAMINATION:  Physical Exam  GENERAL:  84 y.o.-year-old patient lying in the bed with no acute distress.  Obesity. EYES: Pupils equal, round, reactive to light and accommodation. No scleral icterus. Extraocular muscles intact.  HEENT: Head atraumatic, normocephalic. Oropharynx and nasopharynx clear.  NECK:  Supple, no jugular venous distention. No thyroid enlargement, no tenderness.  LUNGS: Normal breath sounds bilaterally, no wheezing, rales,rhonchi or crepitation. No use of accessory muscles of respiration.  CARDIOVASCULAR: S1, S2 normal. No murmurs, rubs, or gallops.  ABDOMEN: Soft, tenderness on palpation in middle part, no rigidity, no rebound, nondistended. Bowel sounds present. No organomegaly or mass.  EXTREMITIES: No pedal edema, cyanosis, or clubbing.  NEUROLOGIC: Cranial nerves II through XII are intact. Muscle strength 4/5 in all extremities. Sensation intact. Gait not checked.  PSYCHIATRIC: The patient is alert and oriented x 3.  SKIN: No obvious rash, lesion, or ulcer.   LABORATORY PANEL:   CBC Recent Labs  Lab 01/30/18 1834  WBC 6.7  HGB 12.0  HCT 35.3  PLT 266   ------------------------------------------------------------------------------------------------------------------  Chemistries  Recent Labs  Lab 01/30/18 1834    NA 142  K 5.3*  CL 104  CO2 32  GLUCOSE 132*  BUN 22  CREATININE 1.19*  CALCIUM 8.8*  AST 15  ALT 7  ALKPHOS 58  BILITOT 0.5   ------------------------------------------------------------------------------------------------------------------  Cardiac Enzymes No results for input(s): TROPONINI in the last 168 hours. ------------------------------------------------------------------------------------------------------------------  RADIOLOGY:  Ct Abdomen Pelvis W Contrast  Result Date: 01/30/2018 CLINICAL DATA:  84 year old female with history of bright red blood per rectum today. History of colon cancer status post colonic resection. EXAM: CT ABDOMEN AND PELVIS WITH CONTRAST TECHNIQUE: Multidetector CT imaging of the abdomen and pelvis was performed using the standard protocol following bolus administration of intravenous contrast. CONTRAST:  45mL OMNIPAQUE IOHEXOL 300 MG/ML  SOLN COMPARISON:  PET/CT 08/03/2017. FINDINGS: Lower chest: Heart size is mildly enlarged. Markedly elevated right hemidiaphragm. Hepatobiliary: There are multiple well-defined low-attenuation lesions in the liver, largest of which measures up to 3.3 x 2.0 cm adjacent to the gallbladder fossa in segments 5/8 (axial image 20 of series 3), compatible with simple cysts. Other subcentimeter low-attenuation lesions in the liver are too small to  definitively characterize, but are also favored to represent small cysts. No suspicious hepatic lesions. No intra or extrahepatic biliary ductal dilatation. Gallbladder is normal in appearance. Pancreas: Multiple low-attenuation lesions are scattered throughout the pancreas, most of which are subcentimeter in size. The largest of these lesions is in the inferior aspect of the head of the pancreas measuring 11 x 20 mm (axial image 43 of series 3). These appear similar in size, number and distribution to the prior examination. Mild ectasia of the pancreatic duct. No peripancreatic fluid  collections or inflammatory changes. Spleen: Unremarkable. Adrenals/Urinary Tract: 1.9 cm low-attenuation lesion in the upper pole of the right kidney is compatible with a simple cyst. Multifocal cortical scarring in the left kidney. No suspicious renal lesions. No hydroureteronephrosis. Urinary bladder is normal in appearance. Bilateral adrenal glands are normal in appearance. Stomach/Bowel: Normal appearance of the stomach. No pathologic dilatation of small bowel or colon. Numerous colonic diverticulae are noted, particularly in the sigmoid colon, without definite surrounding inflammatory changes to suggest an acute diverticulitis at this time. Postoperative changes in the region of the cecum. Status post appendectomy. Vascular/Lymphatic: Aortic atherosclerosis, without evidence of aneurysm or dissection in the abdominal or pelvic vasculature. IVC filter in place with tip well below the level of the renal veins. No lymphadenopathy noted in the abdomen or pelvis. Reproductive: Multiple coarse calcifications are again noted in the uterus, presumably calcified fibroids. Ovaries are unremarkable in appearance. Other: Small left inguinal hernia containing only fat. No significant volume of ascites. No pneumoperitoneum. Musculoskeletal: There are no aggressive appearing lytic or blastic lesions noted in the visualized portions of the skeleton. IMPRESSION: 1. Severe colonic diverticulosis. No surrounding inflammatory changes are appreciated at this time to indicate an acute diverticulitis. 2. Fibroid uterus. 3. Aortic atherosclerosis. 4. Multiple small low-attenuation lesions scattered throughout the pancreas, similar in size, number and pattern of distribution to prior studies dating back to at least 12/01/2015. Given the stability of these lesions and the lack of interval growth, in a patient greater than 70 years of age, these are presumed to be benign and no future imaging followup is recommended. This recommendation  follows ACR consensus guidelines: Management of Incidental Pancreatic Cysts: A White Paper of the ACR Incidental Findings Committee. [place] 7124;[id number]. 5. Additional incidental findings, as above. Electronically Signed   By: Vinnie Langton M.D.   On: 01/30/2018 20:39      IMPRESSION AND PLAN:   GI bleeding with bloody stool possible due to severe diverticulosis. The patient will be admitted to medical floor. Follow-up hemoglobin every 6 hours, GI consult.  N.p.o. except meds and IV fluid support.  Hyperkalemia.  IV fluid support and follow-up BMP. Hypertension.  Hold diuretics at this time. Chronic diastolic CHF, ejection fraction 50%.  Stable.  Continue Lasix.  All the records are reviewed and case discussed with ED provider. Management plans discussed with the patient, family and they are in agreement.  CODE STATUS: DNR.  TOTAL TIME TAKING CARE OF THIS PATIENT: 40 minutes.    Demetrios Loll M.D on 01/30/2018 at 9:43 PM  Between 7am to 6pm - Pager - [phone]  After 6pm go to [email or web address] - Proofreader  [facility]  Office  [phone]  CC: Primary care physician; Jearld Fenton, NP   Note: This dictation was prepared with Dragon dictation along with smaller phrase technology. Any transcriptional errors that result from this process are unin

## 2018-01-30 NOTE — ED Notes (Signed)
Patient transported to room 134 by this EDT.

## 2018-01-31 ENCOUNTER — Inpatient Hospital Stay: Payer: Medicare Other

## 2018-01-31 ENCOUNTER — Other Ambulatory Visit: Payer: Self-pay

## 2018-01-31 ENCOUNTER — Inpatient Hospital Stay (HOSPITAL_COMMUNITY)
Admit: 2018-01-31 | Discharge: 2018-01-31 | Disposition: A | Discharge status: Home / Self Care | Payer: Medicare Other | Attending: Internal Medicine | Admitting: Internal Medicine

## 2018-01-31 DIAGNOSIS — R0789 Other chest pain: Secondary | ICD-10-CM

## 2018-01-31 DIAGNOSIS — K625 Hemorrhage of anus and rectum: Secondary | ICD-10-CM | POA: Diagnosis not present

## 2018-01-31 DIAGNOSIS — I5032 Chronic diastolic (congestive) heart failure: Secondary | ICD-10-CM | POA: Diagnosis not present

## 2018-01-31 DIAGNOSIS — R4182 Altered mental status, unspecified: Secondary | ICD-10-CM | POA: Diagnosis not present

## 2018-01-31 DIAGNOSIS — D62 Acute posthemorrhagic anemia: Secondary | ICD-10-CM

## 2018-01-31 DIAGNOSIS — I110 Hypertensive heart disease with heart failure: Secondary | ICD-10-CM | POA: Diagnosis not present

## 2018-01-31 DIAGNOSIS — Z66 Do not resuscitate: Secondary | ICD-10-CM | POA: Diagnosis not present

## 2018-01-31 DIAGNOSIS — C779 Secondary and unspecified malignant neoplasm of lymph node, unspecified: Secondary | ICD-10-CM | POA: Diagnosis not present

## 2018-01-31 DIAGNOSIS — I10 Essential (primary) hypertension: Secondary | ICD-10-CM | POA: Diagnosis not present

## 2018-01-31 DIAGNOSIS — K921 Melena: Secondary | ICD-10-CM | POA: Diagnosis not present

## 2018-01-31 DIAGNOSIS — R079 Chest pain, unspecified: Secondary | ICD-10-CM | POA: Diagnosis not present

## 2018-01-31 DIAGNOSIS — R9431 Abnormal electrocardiogram [ECG] [EKG]: Secondary | ICD-10-CM | POA: Diagnosis not present

## 2018-01-31 DIAGNOSIS — R55 Syncope and collapse: Secondary | ICD-10-CM | POA: Diagnosis not present

## 2018-01-31 DIAGNOSIS — D649 Anemia, unspecified: Secondary | ICD-10-CM | POA: Diagnosis not present

## 2018-01-31 DIAGNOSIS — K5790 Diverticulosis of intestine, part unspecified, without perforation or abscess without bleeding: Secondary | ICD-10-CM

## 2018-01-31 DIAGNOSIS — E875 Hyperkalemia: Secondary | ICD-10-CM | POA: Diagnosis not present

## 2018-01-31 DIAGNOSIS — K5731 Diverticulosis of large intestine without perforation or abscess with bleeding: Secondary | ICD-10-CM | POA: Diagnosis not present

## 2018-01-31 LAB — MAGNESIUM: Magnesium: 1.9 mg/dL (ref 1.7–2.4)

## 2018-01-31 LAB — TROPONIN I: Troponin I: <0.03 ng/mL (ref <0.03)

## 2018-01-31 LAB — COMPREHENSIVE METABOLIC PANEL
ALT: 6 U/L (ref 0–44)
AST: 16 U/L (ref 15–41)
Albumin: 3.1 g/dL — ABNORMAL LOW (ref 3.5–5.0)
Alkaline Phosphatase: 54 U/L (ref 38–126)
Anion gap: 7 (ref 5–15)
BUN: 22 mg/dL (ref 8–23)
CHLORIDE: 112 mmol/L — AB (ref 98–111)
CO2: 26 mmol/L (ref 22–32)
CREATININE: 1.09 mg/dL — AB (ref 0.44–1.00)
Calcium: 8.7 mg/dL — ABNORMAL LOW (ref 8.9–10.3)
GFR calc non Af Amer: 45 mL/min — ABNORMAL LOW (ref >60)
GFR, EST AFRICAN AMERICAN: 52 mL/min — AB (ref >60)
Glucose, Bld: 120 mg/dL — ABNORMAL HIGH (ref 70–99)
POTASSIUM: 5.9 mmol/L — AB (ref 3.5–5.1)
Sodium: 145 mmol/L (ref 135–145)
Total Bilirubin: 0.5 mg/dL (ref 0.3–1.2)
Total Protein: 5.6 g/dL — ABNORMAL LOW (ref 6.5–8.1)

## 2018-01-31 LAB — GLUCOSE, CAPILLARY
Glucose-Capillary: 106 mg/dL — ABNORMAL HIGH (ref 70–99)
Glucose-Capillary: 177 mg/dL — ABNORMAL HIGH (ref 70–99)
Glucose-Capillary: 117 mg/dL — ABNORMAL HIGH (ref 70–99)
Glucose-Capillary: 110 mg/dL — ABNORMAL HIGH (ref 70–99)

## 2018-01-31 LAB — HEMOGLOBIN: HEMOGLOBIN: 11 g/dL — AB (ref 12.0–16.0)

## 2018-01-31 LAB — PHOSPHORUS: PHOSPHORUS: 3.2 mg/dL (ref 2.5–4.6)

## 2018-01-31 MED ORDER — MORPHINE SULFATE (PF) 2 MG/ML IV SOLN
1.0000 mg | INTRAVENOUS | Status: DC | PRN
  Administered 2018-01-31: 1 mg via INTRAVENOUS
  Filled 2018-01-31: qty 1

## 2018-01-31 MED ORDER — PANTOPRAZOLE SODIUM 40 MG IV SOLR
40.0000 mg | Freq: Two times a day (BID) | INTRAVENOUS | Status: DC
  Administered 2018-01-31 – 2018-02-02 (×5): 40 mg via INTRAVENOUS
  Filled 2018-01-31 (×5): qty 40

## 2018-01-31 MED ORDER — SODIUM CHLORIDE 0.9% FLUSH
3.0000 mL | Freq: Two times a day (BID) | INTRAVENOUS | Status: DC
  Administered 2018-01-31 – 2018-02-03 (×7): 3 mL via INTRAVENOUS

## 2018-01-31 MED ORDER — FUROSEMIDE 10 MG/ML IJ SOLN
20.0000 mg | Freq: Every day | INTRAMUSCULAR | Status: DC
  Administered 2018-01-31: 20 mg via INTRAVENOUS
  Filled 2018-01-31: qty 2

## 2018-01-31 MED ORDER — KETOROLAC TROMETHAMINE 30 MG/ML IJ SOLN
30.0000 mg | Freq: Four times a day (QID) | INTRAMUSCULAR | Status: DC | PRN
  Administered 2018-01-31: 30 mg via INTRAVENOUS
  Filled 2018-01-31: qty 1

## 2018-01-31 MED ORDER — PEG 3350-KCL-NA BICARB-NACL 420 G PO SOLR
4000.0000 mL | Freq: Once | ORAL | Status: AC
  Administered 2018-01-31: 4000 mL via ORAL
  Filled 2018-01-31: qty 4000

## 2018-01-31 MED ORDER — DEXTROSE 50 % IV SOLN
25.0000 mL | Freq: Once | INTRAVENOUS | Status: AC
Start: 2018-01-31 — End: 2018-01-31
  Administered 2018-01-31: 25 mL via INTRAVENOUS
  Filled 2018-01-31: qty 50

## 2018-01-31 MED ORDER — INSULIN ASPART 100 UNIT/ML IV SOLN
10.0000 [IU] | Freq: Once | INTRAVENOUS | Status: AC
  Administered 2018-01-31: 10 [IU] via INTRAVENOUS
  Filled 2018-01-31: qty 0.1

## 2018-01-31 MED ORDER — BISACODYL 5 MG PO TBEC
10.0000 mg | DELAYED_RELEASE_TABLET | Freq: Once | ORAL | Status: AC
  Administered 2018-01-31: 10 mg via ORAL
  Filled 2018-01-31: qty 2

## 2018-01-31 NOTE — Progress Notes (Signed)
Pt complain of chest pain. MD notified. New orders placed. Patient to be transferred to 2A. Will continue to monitor.

## 2018-01-31 NOTE — Consult Note (Signed)
Cardiology Consultation:   Patient ID: Jill Bowers; [id number]; 01-Sep-1928   Admit date: 01/30/2018 Date of Consult: 01/31/2018  Primary Care Provider: Jearld Fenton, NP Primary Cardiologist: Dr. Rockey Situ   Patient Profile:   Jill Bowers is a 84 y.o. female with a hx of chronic diastolic heart failure who is being seen today for the evaluation of chest pain at the request of Dr. Benjie Karvonen.  History of Present Illness:   Jill Bowers is an 83 year old female with history of colon cancer diagnosed in 2016 status post surgery, chronic diastolic heart failure, incidental coronary atherosclerosis noted on previous imaging, history of DVT, GERD and pulmonary embolism. She presented with hematochezia which has been a recurrent problem for this patient.  She had a reported syncopal episode this morning after she had 2 bloody bowel movements and was transferred to to aid due to that reason.  The patient herself is a very poor historian and does not remember the details. She complained this afternoon of lower chest and upper epigastric discomfort that lasted for about 30 minutes and was relieved with Vicodin.  She is currently pain-free.  EKG showed nonspecific T wave changes.  First troponin is negative. GI is planning endoscopic procedures tomorrow to evaluate GI bleed.  Past Medical History:  Diagnosis Date  . CHF (congestive heart failure) ([place])   . Colon cancer ([place])   . Coronary artery disease   . DVT (deep venous thrombosis) ([place])   . GERD (gastroesophageal reflux disease)   . Hypertension   . Malignant neoplasm of stomach ([place])   . PE (pulmonary embolism)   . Renal disorder   . Renal insufficiency     Past Surgical History:  Procedure Laterality Date  . ABDOMINAL HYSTERECTOMY    . COLON SURGERY    . COLONOSCOPY WITH PROPOFOL N/A 08/23/2016   Procedure: COLONOSCOPY WITH PROPOFOL;  Surgeon: Jonathon Bellows, MD;  Location: [facility] ENDOSCOPY;  Service: Gastroenterology;  Laterality: N/A;  .  COLONOSCOPY WITH PROPOFOL N/A 08/24/2016   Procedure: COLONOSCOPY WITH PROPOFOL;  Surgeon: Jonathon Bellows, MD;  Location: [facility] ENDOSCOPY;  Service: Endoscopy;  Laterality: N/A;  . IVC FILTER PLACEMENT ([place] [facility])    . JOINT REPLACEMENT Right    knee     Home Medications:  Prior to Admission medications   Medication Sig Start Date End Date Taking? Authorizing Provider  Cholecalciferol (VITAMIN D-3) 1000 UNITS CAPS Take 2 capsules by mouth daily.   Yes [provider]  fentaNYL (DURAGESIC - DOSED MCG/HR) 12 MCG/HR Place 1 patch (12.5 mcg total) onto the skin every 3 (three) days. 01/24/18  Yes Jearld Fenton, NP  furosemide (LASIX) 40 MG tablet Take 1 tablet (40 mg) by mouth once daily 12/14/17  Yes Gollan, Kathlene November, MD  Magnesium Oxide 500 MG TABS Take 1 tablet by mouth 2 (two) times daily.   Yes [provider]  MEGARED OMEGA-3 KRILL OIL 500 MG CAPS Take by mouth.   Yes [provider]  omeprazole (PRILOSEC) 20 MG capsule Take 20 mg by mouth daily.   Yes [provider]  Polyethylene Glycol 3350 (MIRALAX PO) Take by mouth daily.   Yes [provider]  spironolactone (ALDACTONE) 25 MG tablet TAKE 1 TABLET (25 MG TOTAL) BY MOUTH 2 (TWO) TIMES DAILY AS NEEDED. 12/26/17  Yes Gollan, Kathlene November, MD  vitamin C (ASCORBIC ACID) 500 MG tablet Take 500 mg by mouth daily.   Yes [provider]    Inpatient Medications: Scheduled Meds: . fentaNYL  12.5 mcg Transdermal Q72H  . magnesium oxide  400 mg Oral BID  . pantoprazole (PROTONIX) IV  40 mg Intravenous BID  . sodium chloride flush  3 mL Intravenous Q12H   Continuous Infusions:  PRN Meds: acetaminophen **OR** acetaminophen, albuterol, bisacodyl, HYDROcodone-acetaminophen, morphine injection, ondansetron **OR** ondansetron (ZOFRAN) IV, senna-docusate  Allergies:    Allergies  Allergen Reactions  . Gabapentin Other (See Comments)    Nightmares   . Reglan [Metoclopramide] Other (See Comments)  .  Statins Other (See Comments)  . Torsemide Other (See Comments)    Social History:   Social History   Socioeconomic History  . Marital status: Widowed    Spouse name: Not on file  . Number of children: Not on file  . Years of education: Not on file  . Highest education level: Not on file  Occupational History  . Not on file  Social Needs  . Financial resource strain: Not on file  . Food insecurity:    Worry: Not on file    Inability: Not on file  . Transportation needs:    Medical: Not on file    Non-medical: Not on file  Tobacco Use  . Smoking status: Never Smoker  . Smokeless tobacco: Never Used  Substance and Sexual Activity  . Alcohol use: No  . Drug use: No  . Sexual activity: Never  Lifestyle  . Physical activity:    Days per week: Not on file    Minutes per session: Not on file  . Stress: Not on file  Relationships  . Social connections:    Talks on phone: Not on file    Gets together: Not on file    Attends religious service: Not on file    Active member of club or organization: Not on file    Attends meetings of clubs or organizations: Not on file    Relationship status: Not on file  . Intimate partner violence:    Fear of current or ex partner: Not on file    Emotionally abused: Not on file    Physically abused: Not on file    Forced sexual activity: Not on file  Other Topics Concern  . Not on file  Social History Narrative   Lives at home with daughter, has a walker    Family History:    Family History  Problem Relation Age of Onset  . Arthritis Mother   . Hypertension Mother   . Colon cancer Sister   . Heart disease Daughter   . Colon cancer Son   . Pancreatic cancer Son      ROS:  Please see the history of present illness.   All other ROS reviewed and negative.     Physical Exam/Data:   Vitals:   01/31/18 0608 01/31/18 0751 01/31/18 1440 01/31/18 1603  BP: (!) 131/46 (!) 130/56 (!) 126/48 (!) 124/42  Pulse: 60 67 63 (!) 59  Resp:  18 20 19 18   Temp: 98.4 F (36.9 C) 98.5 F (36.9 C) 98.3 F (36.8 C) 98.4 F (36.9 C)  TempSrc: Oral Oral Oral Oral  SpO2: 100% 100% 100% 98%  Weight:      Height:        Intake/Output Summary (Last 24 hours) at 01/31/2018 1713 Last data filed at 01/31/2018 0600 Gross per 24 hour  Intake 1380.42 ml  Output -  Net 1380.42 ml   Filed Weights   01/30/18 1824 01/31/18 0035  Weight: 215 lb (97.5 kg) 206 lb 5.6  oz (93.6 kg)   Body mass index is 36.55 kg/m.  General:  Well nourished, well developed, in no acute distress HEENT: normal Lymph: no adenopathy Neck: no JVD Endocrine:  No thryomegaly Vascular: No carotid bruits;  Cardiac:  normal S1, S2; RRR; no murmur  Lungs:  clear to auscultation bilaterally, no wheezing, rhonchi or rales  Abd: soft, nontender, no hepatomegaly .  Reproducible pain and tenderness in the upper epigastric area Ext: no edema Musculoskeletal:  No deformities, BUE and BLE strength normal and equal Skin: warm and dry  Neuro:  CNs 2-12 intact, no focal abnormalities noted Psych:  Normal affect   EKG:  The EKG was personally reviewed and demonstrates:  The patient reports prolonged history of normal sinus rhythm with LVH and nonspecific T wave changes. Telemetry:  Telemetry was personally reviewed and demonstrates: Normal sinus rhythm with no significant arrhythmia.  Echocardiogram is pending  Relevant CV Studies:   Laboratory Data:  Chemistry Recent Labs  Lab 01/30/18 1834 01/31/18 0523  NA 142 145  K 5.3* 5.9*  CL 104 112*  CO2 32 26  GLUCOSE 132* 120*  BUN 22 22  CREATININE 1.19* 1.09*  CALCIUM 8.8* 8.7*  GFRNONAA 40* 45*  GFRAA 47* 52*  ANIONGAP 6 7    Recent Labs  Lab 01/30/18 1834 01/31/18 0523  PROT 6.4* 5.6*  ALBUMIN 3.5 3.1*  AST 15 16  ALT 7 6  ALKPHOS 58 54  BILITOT 0.5 0.5   Hematology Recent Labs  Lab 01/30/18 1834 01/30/18 2352 01/31/18 0604  WBC 6.7  --   --   RBC 3.89  --   --   HGB 12.0 11.0* 11.0*  HCT  35.3  --   --   MCV 90.9  --   --   MCH 30.8  --   --   MCHC 33.9  --   --   RDW 13.9  --   --   PLT 266  --   --    Cardiac Enzymes Recent Labs  Lab 01/31/18 0523 01/31/18 1506  TROPONINI <0.03 <0.03   No results for input(s): TROPIPOC in the last 168 hours.  BNPNo results for input(s): BNP, PROBNP in the last 168 hours.  DDimer No results for input(s): DDIMER in the last 168 hours.  Radiology/Studies:  Ct Head Wo Contrast  Result Date: 01/31/2018 CLINICAL DATA:  84 y/o F; loss of consciousness and altered mental status. History of colon cancer. EXAM: CT HEAD WITHOUT CONTRAST TECHNIQUE: Contiguous axial images were obtained from the base of the skull through the vertex without intravenous contrast. COMPARISON:  08/03/2017 PET-CT. FINDINGS: Brain: No evidence of acute infarction, hemorrhage, hydrocephalus, extra-axial collection or mass lesion/mass effect. Nonspecific foci of white matter hypoattenuation are compatible with chronic microvascular ischemic changes. There are very small chronic lacunar infarcts within the right putamen, bilateral caudate heads, and bilateral thalami. Mild diffuse volume loss of the brain. Vascular: Calcific atherosclerosis of carotid siphons. No hyperdense vessel. Skull: Normal. Negative for fracture or focal lesion. Sinuses/Orbits: No acute finding. Other: None. IMPRESSION: 1. No acute intracranial abnormality identified. 2. Mild chronic microvascular ischemic changes and volume loss of the brain for age. Small chronic lacunar infarcts in the basal ganglia. Electronically Signed   By: Kristine Garbe M.D.   On: 01/31/2018 01:30   Ct Abdomen Pelvis W Contrast  Result Date: 01/30/2018 CLINICAL DATA:  84 year old female with history of bright red blood per rectum today. History of colon cancer status post colonic  resection. EXAM: CT ABDOMEN AND PELVIS WITH CONTRAST TECHNIQUE: Multidetector CT imaging of the abdomen and pelvis was performed using the  standard protocol following bolus administration of intravenous contrast. CONTRAST:  39mL OMNIPAQUE IOHEXOL 300 MG/ML  SOLN COMPARISON:  PET/CT 08/03/2017. FINDINGS: Lower chest: Heart size is mildly enlarged. Markedly elevated right hemidiaphragm. Hepatobiliary: There are multiple well-defined low-attenuation lesions in the liver, largest of which measures up to 3.3 x 2.0 cm adjacent to the gallbladder fossa in segments 5/8 (axial image 20 of series 3), compatible with simple cysts. Other subcentimeter low-attenuation lesions in the liver are too small to definitively characterize, but are also favored to represent small cysts. No suspicious hepatic lesions. No intra or extrahepatic biliary ductal dilatation. Gallbladder is normal in appearance. Pancreas: Multiple low-attenuation lesions are scattered throughout the pancreas, most of which are subcentimeter in size. The largest of these lesions is in the inferior aspect of the head of the pancreas measuring 11 x 20 mm (axial image 43 of series 3). These appear similar in size, number and distribution to the prior examination. Mild ectasia of the pancreatic duct. No peripancreatic fluid collections or inflammatory changes. Spleen: Unremarkable. Adrenals/Urinary Tract: 1.9 cm low-attenuation lesion in the upper pole of the right kidney is compatible with a simple cyst. Multifocal cortical scarring in the left kidney. No suspicious renal lesions. No hydroureteronephrosis. Urinary bladder is normal in appearance. Bilateral adrenal glands are normal in appearance. Stomach/Bowel: Normal appearance of the stomach. No pathologic dilatation of small bowel or colon. Numerous colonic diverticulae are noted, particularly in the sigmoid colon, without definite surrounding inflammatory changes to suggest an acute diverticulitis at this time. Postoperative changes in the region of the cecum. Status post appendectomy. Vascular/Lymphatic: Aortic atherosclerosis, without evidence of  aneurysm or dissection in the abdominal or pelvic vasculature. IVC filter in place with tip well below the level of the renal veins. No lymphadenopathy noted in the abdomen or pelvis. Reproductive: Multiple coarse calcifications are again noted in the uterus, presumably calcified fibroids. Ovaries are unremarkable in appearance. Other: Small left inguinal hernia containing only fat. No significant volume of ascites. No pneumoperitoneum. Musculoskeletal: There are no aggressive appearing lytic or blastic lesions noted in the visualized portions of the skeleton. IMPRESSION: 1. Severe colonic diverticulosis. No surrounding inflammatory changes are appreciated at this time to indicate an acute diverticulitis. 2. Fibroid uterus. 3. Aortic atherosclerosis. 4. Multiple small low-attenuation lesions scattered throughout the pancreas, similar in size, number and pattern of distribution to prior studies dating back to at least 12/01/2015. Given the stability of these lesions and the lack of interval growth, in a patient greater than 3 years of age, these are presumed to be benign and no future imaging followup is recommended. This recommendation follows ACR consensus guidelines: Management of Incidental Pancreatic Cysts: A White Paper of the ACR Incidental Findings Committee. [place] 1610;[id number]. 5. Additional incidental findings, as above. Electronically Signed   By: Vinnie Langton M.D.   On: 01/30/2018 20:39    Assessment and Plan:   1. Atypical chest pain: Likely GI in origin.  EKG does not show significant ischemic changes and first troponin is negative.  An echocardiogram is pending.  If normal EF and no wall motion abnormalities, I do not think this patient requires further ischemic cardiac evaluation.  As long as her troponin remains negative, she can proceed with endoscopic procedures tomorrow even before getting the echocardiogram done.  She is considered at moderate risk for conscious sedation  considering  her age. 2. Chronic diastolic heart failure: She appears to be euvolemic. 3. GI bleed: Followed by gastroenterology with plans for endoscopic procedures tomorrow.   For questions or updates, please contact [place] Please consult [email or web address] for contact info under Cardiology/STEMI.   Signed, Kathlyn Sacramento, MD  01/31/2018 5:13 PM

## 2018-01-31 NOTE — Progress Notes (Signed)
Jill Bowers NAME: Jill Bowers    MR#:  [id number]  DATE OF BIRTH:  11/23/1928  SUBJECTIVE:   Patient presented with bloody stools.  Patient had rapid response yesterday after she was had a large bloody bowel movement.  REVIEW OF SYSTEMS:    Review of Systems  Constitutional: Negative for fever, chills weight loss HENT: Negative for ear pain, nosebleeds, congestion, facial swelling, rhinorrhea, neck pain, neck stiffness and ear discharge.   Respiratory: Negative for cough, shortness of breath, wheezing  Cardiovascular: Negative for chest pain, palpitations and leg swelling.  Gastrointestinal: Positive for bloody stools and abdominal pain Genitourinary: Negative for dysuria, urgency, frequency, hematuria Musculoskeletal: Negative for back pain or joint pain Neurological: Negative for dizziness, seizures, syncope, focal weakness,  numbness and headaches.  Hematological: Does not bruise/bleed easily.  Psychiatric/Behavioral: Negative for hallucinations, confusion, dysphoric mood    Tolerating Diet: npo      DRUG ALLERGIES:   Allergies  Allergen Reactions  . Gabapentin Other (See Comments)    Nightmares   . Reglan [Metoclopramide] Other (See Comments)  . Statins Other (See Comments)  . Torsemide Other (See Comments)    VITALS:  Blood pressure (!) 130/56, pulse 67, temperature 98.5 F (36.9 C), temperature source Oral, resp. rate 20, height 5\' 3"  (1.6 m), weight 206 lb 5.6 oz (93.6 kg), SpO2 100 %.  PHYSICAL EXAMINATION:  Constitutional: Appears well-developed and well-nourished. No distress.  Obese HENT: Normocephalic. [place] Kitchen Oropharynx is clear and moist.  Eyes: Conjunctivae and EOM are normal. PERRLA, no scleral icterus.  Neck: Normal ROM. Neck supple. No JVD. No tracheal deviation. CVS: RRR, S1/S2 +, no murmurs, no gallops, no carotid bruit.  Pulmonary: Effort and breath sounds normal, no stridor, rhonchi, wheezes,  rales.  Abdominal: Creased bowel sounds with right lower quadrant abdominal pain no rebound or guarding   musculoskeletal: Normal range of motion. No edema and no tenderness.  Neuro: Alert. CN 2-12 grossly intact. No focal deficits. Skin: Skin is warm and dry. No rash noted. Psychiatric: Normal mood and affect.      LABORATORY PANEL:   CBC Recent Labs  Lab 01/30/18 1834  01/31/18 0604  WBC 6.7  --   --   HGB 12.0   < > 11.0*  HCT 35.3  --   --   PLT 266  --   --    < > = values in this interval not displayed.   ------------------------------------------------------------------------------------------------------------------  Chemistries  Recent Labs  Lab 01/31/18 0523  NA 145  K 5.9*  CL 112*  CO2 26  GLUCOSE 120*  BUN 22  CREATININE 1.09*  CALCIUM 8.7*  MG 1.9  AST 16  ALT 6  ALKPHOS 54  BILITOT 0.5   ------------------------------------------------------------------------------------------------------------------  Cardiac Enzymes Recent Labs  Lab 01/31/18 0523  TROPONINI <0.03   ------------------------------------------------------------------------------------------------------------------  RADIOLOGY:  Ct Head Wo Contrast  Result Date: 01/31/2018 CLINICAL DATA:  84 y/o F; loss of consciousness and altered mental status. History of colon cancer. EXAM: CT HEAD WITHOUT CONTRAST TECHNIQUE: Contiguous axial images were obtained from the base of the skull through the vertex without intravenous contrast. COMPARISON:  08/03/2017 PET-CT. FINDINGS: Brain: No evidence of acute infarction, hemorrhage, hydrocephalus, extra-axial collection or mass lesion/mass effect. Nonspecific foci of white matter hypoattenuation are compatible with chronic microvascular ischemic changes. There are very small chronic lacunar infarcts within the right putamen, bilateral caudate heads, and bilateral thalami. Mild diffuse volume loss of the brain.  Vascular: Calcific atherosclerosis of  carotid siphons. No hyperdense vessel. Skull: Normal. Negative for fracture or focal lesion. Sinuses/Orbits: No acute finding. Other: None. IMPRESSION: 1. No acute intracranial abnormality identified. 2. Mild chronic microvascular ischemic changes and volume loss of the brain for age. Small chronic lacunar infarcts in the basal ganglia. Electronically Signed   By: Kristine Garbe M.D.   On: 01/31/2018 01:30   Ct Abdomen Pelvis W Contrast  Result Date: 01/30/2018 CLINICAL DATA:  84 year old female with history of bright red blood per rectum today. History of colon cancer status post colonic resection. EXAM: CT ABDOMEN AND PELVIS WITH CONTRAST TECHNIQUE: Multidetector CT imaging of the abdomen and pelvis was performed using the standard protocol following bolus administration of intravenous contrast. CONTRAST:  62mL OMNIPAQUE IOHEXOL 300 MG/ML  SOLN COMPARISON:  PET/CT 08/03/2017. FINDINGS: Lower chest: Heart size is mildly enlarged. Markedly elevated right hemidiaphragm. Hepatobiliary: There are multiple well-defined low-attenuation lesions in the liver, largest of which measures up to 3.3 x 2.0 cm adjacent to the gallbladder fossa in segments 5/8 (axial image 20 of series 3), compatible with simple cysts. Other subcentimeter low-attenuation lesions in the liver are too small to definitively characterize, but are also favored to represent small cysts. No suspicious hepatic lesions. No intra or extrahepatic biliary ductal dilatation. Gallbladder is normal in appearance. Pancreas: Multiple low-attenuation lesions are scattered throughout the pancreas, most of which are subcentimeter in size. The largest of these lesions is in the inferior aspect of the head of the pancreas measuring 11 x 20 mm (axial image 43 of series 3). These appear similar in size, number and distribution to the prior examination. Mild ectasia of the pancreatic duct. No peripancreatic fluid collections or inflammatory changes. Spleen:  Unremarkable. Adrenals/Urinary Tract: 1.9 cm low-attenuation lesion in the upper pole of the right kidney is compatible with a simple cyst. Multifocal cortical scarring in the left kidney. No suspicious renal lesions. No hydroureteronephrosis. Urinary bladder is normal in appearance. Bilateral adrenal glands are normal in appearance. Stomach/Bowel: Normal appearance of the stomach. No pathologic dilatation of small bowel or colon. Numerous colonic diverticulae are noted, particularly in the sigmoid colon, without definite surrounding inflammatory changes to suggest an acute diverticulitis at this time. Postoperative changes in the region of the cecum. Status post appendectomy. Vascular/Lymphatic: Aortic atherosclerosis, without evidence of aneurysm or dissection in the abdominal or pelvic vasculature. IVC filter in place with tip well below the level of the renal veins. No lymphadenopathy noted in the abdomen or pelvis. Reproductive: Multiple coarse calcifications are again noted in the uterus, presumably calcified fibroids. Ovaries are unremarkable in appearance. Other: Small left inguinal hernia containing only fat. No significant volume of ascites. No pneumoperitoneum. Musculoskeletal: There are no aggressive appearing lytic or blastic lesions noted in the visualized portions of the skeleton. IMPRESSION: 1. Severe colonic diverticulosis. No surrounding inflammatory changes are appreciated at this time to indicate an acute diverticulitis. 2. Fibroid uterus. 3. Aortic atherosclerosis. 4. Multiple small low-attenuation lesions scattered throughout the pancreas, similar in size, number and pattern of distribution to prior studies dating back to at least 12/01/2015. Given the stability of these lesions and the lack of interval growth, in a patient greater than 78 years of age, these are presumed to be benign and no future imaging followup is recommended. This recommendation follows ACR consensus guidelines: Management  of Incidental Pancreatic Cysts: A White Paper of the ACR Incidental Findings Committee. [place] [id number];[id number]. 5. Additional incidental findings, as above. Electronically Signed  By: Vinnie Langton M.D.   On: 01/30/2018 20:39     ASSESSMENT AND PLAN:    84 year old female with a history of colon cancer and prior GI bleed who presented to the ER due to GI rectal bleeding  1.  Bright red blood per rectum: Hemoglobin has remained relatively stable however patient has right lower quadrant abdominal pain and still having bloody bowel movements. Continue n.p.o. status and follow-up on GI recommendations. Patient is status post 1 unit PRBC after large bloody bowel movement CT scan shows diverticulosis without diverticulitis. 2.  Presyncope after large bloody bowel movement: Follow-up on echocardiogram Continue telemetry Follow troponin CT head was negative for acute pathology 3  Hyperkalemia: Insulin and dextrose along with Lasix given   Management plans discussed with the patient and family and they are  in agreement.  CODE STATUS: DNR  TOTAL TIME TAKING CARE OF THIS PATIENT: 30 minutes.     POSSIBLE D/C 2 days, DEPENDING ON CLINICAL CONDITION.   Zandria Woldt M.D on 01/31/2018 at 10:54 AM  Between 7am to 6pm - Pager - [phone] After 6pm go to [email or web address] - password [email or web address] [facility]  Office  [phone]  CC: Primary care physician; Jearld Fenton, NP  Note: This dictation was prepared with Dragon dictation along with smaller phrase technology. Any transcriptional errors that result from this process are unintentional.

## 2018-01-31 NOTE — Progress Notes (Signed)
MD notified of pt complaining of chest/upper gi pain, 12 lead ekg and v/s taken, serial troponins ordered, pt given PRN pain medication, no orders for nitro at this time. GI at bedside at this time, will continue to monitor pt.

## 2018-01-31 NOTE — Progress Notes (Signed)
Patient was transferred from 1A following a a syncope episode preceded by 2 large bloody bowel movement per report . Although patient could not remember the syncope episode she was A&O X4. Patient and daughter were oriented to the unit. Care plan was also discussed with patient and daughter. Patient was placed on cardiac monitor and kept NPO per order.

## 2018-01-31 NOTE — Consult Note (Signed)
Vonda Antigua, MD [address] [place], [place], [place], [address] [address], [place], [place], [place], [address] Phone: [phone]  Fax: [phone]  Consultation  Referring Provider:     Dr. Benjie Karvonen Primary Care Physician:  Jearld Fenton, NP Reason for Consultation:     Hematochezia  Date of Admission:  01/30/2018 Date of Consultation:  01/31/2018         HPI:   Jill Bowers is a 84 y.o. female presents with hematochezia, with history of colon cancer in 2016 and she underwent surgery for this in [place] at the time.  Has had 4-5 bowel movements with hematochezia.  Last one was this morning, but was small and much less in quantity than before.  As per Dr. Verlin Grills February 2018 consult note, lymph nodes were positive and she was offered chemo but patient decided not to undergo chemo at that time.  She presented in February 2018 with rectal bleeding as well as underwent colonoscopy that found diverticulosis but no active bleeding at the time of the procedure.  Internal hemorrhoids were also seen.  Source of bleeding was thought to be due to diverticulosis.  Patient required 2-day prep at that time, as initial colonoscopy was poor prep and had to be repeated the next day with more prep.  Lab work reveals hemoglobin to be 11.  History is provided by patient and her family at bedside as well.  Daughter who patient lives with states that patient always complains of abdominal pain, but as of recently has had more intense abdominal pain in the midepigastric region.  No nausea or vomiting.  They also state that her hematochezia on this admission is worse than her hematochezia in February 2018.  Past Medical History:  Diagnosis Date  . CHF (congestive heart failure) ([place])   . Colon cancer ([place])   . Coronary artery disease   . DVT (deep venous thrombosis) ([place])   . GERD (gastroesophageal reflux disease)   . Hypertension   . Malignant neoplasm of stomach ([place])   . PE  (pulmonary embolism)   . Renal disorder   . Renal insufficiency     Past Surgical History:  Procedure Laterality Date  . ABDOMINAL HYSTERECTOMY    . COLON SURGERY    . COLONOSCOPY WITH PROPOFOL N/A 08/23/2016   Procedure: COLONOSCOPY WITH PROPOFOL;  Surgeon: Jonathon Bellows, MD;  Location: [facility] ENDOSCOPY;  Service: Gastroenterology;  Laterality: N/A;  . COLONOSCOPY WITH PROPOFOL N/A 08/24/2016   Procedure: COLONOSCOPY WITH PROPOFOL;  Surgeon: Jonathon Bellows, MD;  Location: [facility] ENDOSCOPY;  Service: Endoscopy;  Laterality: N/A;  . IVC FILTER PLACEMENT ([place] [facility])    . JOINT REPLACEMENT Right    knee    Prior to Admission medications   Medication Sig Start Date End Date Taking? Authorizing Provider  Cholecalciferol (VITAMIN D-3) 1000 UNITS CAPS Take 2 capsules by mouth daily.   Yes [provider]  fentaNYL (DURAGESIC - DOSED MCG/HR) 12 MCG/HR Place 1 patch (12.5 mcg total) onto the skin every 3 (three) days. 01/24/18  Yes Jearld Fenton, NP  furosemide (LASIX) 40 MG tablet Take 1 tablet (40 mg) by mouth once daily 12/14/17  Yes Gollan, Kathlene November, MD  Magnesium Oxide 500 MG TABS Take 1 tablet by mouth 2 (two) times daily.   Yes [provider]  MEGARED OMEGA-3 KRILL OIL 500 MG CAPS Take by mouth.   Yes [provider]  omeprazole (PRILOSEC) 20 MG capsule Take 20 mg by  mouth daily.   Yes [provider]  Polyethylene Glycol 3350 (MIRALAX PO) Take by mouth daily.   Yes [provider]  spironolactone (ALDACTONE) 25 MG tablet TAKE 1 TABLET (25 MG TOTAL) BY MOUTH 2 (TWO) TIMES DAILY AS NEEDED. 12/26/17  Yes Gollan, Kathlene November, MD  vitamin C (ASCORBIC ACID) 500 MG tablet Take 500 mg by mouth daily.   Yes [provider]    Family History  Problem Relation Age of Onset  . Arthritis Mother   . Hypertension Mother   . Colon cancer Sister   . Heart disease Daughter   . Colon cancer Son   . Pancreatic cancer Son      Social History   Tobacco Use  .  Smoking status: Never Smoker  . Smokeless tobacco: Never Used  Substance Use Topics  . Alcohol use: No  . Drug use: No    Allergies as of 01/30/2018 - Review Complete 01/30/2018  Allergen Reaction Noted  . Gabapentin Other (See Comments) 09/16/2016  . Reglan [metoclopramide] Other (See Comments) 05/16/2015  . Statins Other (See Comments) 05/16/2015  . Torsemide Other (See Comments) 05/16/2015    Review of Systems:    All systems reviewed and negative except where noted in HPI.   Physical Exam:  Vital signs in last 24 hours: Vitals:   01/31/18 0035 01/31/18 0355 01/31/18 0608 01/31/18 0751  BP: 121/65 (!) 126/36 (!) 131/46 (!) 130/56  Pulse: 65 67 60 67  Resp: 20 20 18 20   Temp: 98.2 F (36.8 C) 98 F (36.7 C) 98.4 F (36.9 C) 98.5 F (36.9 C)  TempSrc: Oral Oral Oral Oral  SpO2: 100% 100% 100% 100%  Weight: 206 lb 5.6 oz (93.6 kg)     Height: 5\' 3"  (1.6 m)      Last BM Date: 01/31/18 General:   Pleasant, cooperative in NAD Head:  Normocephalic and atraumatic. Eyes:   No icterus.   Conjunctiva pink. PERRLA. Ears:  Normal auditory acuity. Neck:  Supple; no masses or thyroidomegaly Lungs: Respirations even and unlabored. Lungs clear to auscultation bilaterally.   No wheezes, crackles, or rhonchi.  Abdomen:  Soft, nondistended, nontender. Normal bowel sounds. No appreciable masses or hepatomegaly.  No rebound or guarding.  Neurologic:  Alert and oriented x3;  grossly normal neurologically. Skin:  Intact without significant lesions or rashes. Cervical Nodes:  No significant cervical adenopathy. Psych:  Alert and cooperative. Normal affect.  LAB RESULTS: Recent Labs    01/30/18 1834 01/30/18 2352 01/31/18 0604  WBC 6.7  --   --   HGB 12.0 11.0* 11.0*  HCT 35.3  --   --   PLT 266  --   --    BMET Recent Labs    01/30/18 1834 01/31/18 0523  NA 142 145  K 5.3* 5.9*  CL 104 112*  CO2 32 26  GLUCOSE 132* 120*  BUN 22 22  CREATININE 1.19* 1.09*  CALCIUM 8.8*  8.7*   LFT Recent Labs    01/31/18 0523  PROT 5.6*  ALBUMIN 3.1*  AST 16  ALT 6  ALKPHOS 54  BILITOT 0.5   PT/INR Recent Labs    01/30/18 2127  LABPROT 14.2  INR 1.11    STUDIES: Ct Head Wo Contrast  Result Date: 01/31/2018 CLINICAL DATA:  84 y/o F; loss of consciousness and altered mental status. History of colon cancer. EXAM: CT HEAD WITHOUT CONTRAST TECHNIQUE: Contiguous axial images were obtained from the base of the skull through  the vertex without intravenous contrast. COMPARISON:  08/03/2017 PET-CT. FINDINGS: Brain: No evidence of acute infarction, hemorrhage, hydrocephalus, extra-axial collection or mass lesion/mass effect. Nonspecific foci of white matter hypoattenuation are compatible with chronic microvascular ischemic changes. There are very small chronic lacunar infarcts within the right putamen, bilateral caudate heads, and bilateral thalami. Mild diffuse volume loss of the brain. Vascular: Calcific atherosclerosis of carotid siphons. No hyperdense vessel. Skull: Normal. Negative for fracture or focal lesion. Sinuses/Orbits: No acute finding. Other: None. IMPRESSION: 1. No acute intracranial abnormality identified. 2. Mild chronic microvascular ischemic changes and volume loss of the brain for age. Small chronic lacunar infarcts in the basal ganglia. Electronically Signed   By: Kristine Garbe M.D.   On: 01/31/2018 01:30   Ct Abdomen Pelvis W Contrast  Result Date: 01/30/2018 CLINICAL DATA:  84 year old female with history of bright red blood per rectum today. History of colon cancer status post colonic resection. EXAM: CT ABDOMEN AND PELVIS WITH CONTRAST TECHNIQUE: Multidetector CT imaging of the abdomen and pelvis was performed using the standard protocol following bolus administration of intravenous contrast. CONTRAST:  34mL OMNIPAQUE IOHEXOL 300 MG/ML  SOLN COMPARISON:  PET/CT 08/03/2017. FINDINGS: Lower chest: Heart size is mildly enlarged. Markedly elevated  right hemidiaphragm. Hepatobiliary: There are multiple well-defined low-attenuation lesions in the liver, largest of which measures up to 3.3 x 2.0 cm adjacent to the gallbladder fossa in segments 5/8 (axial image 20 of series 3), compatible with simple cysts. Other subcentimeter low-attenuation lesions in the liver are too small to definitively characterize, but are also favored to represent small cysts. No suspicious hepatic lesions. No intra or extrahepatic biliary ductal dilatation. Gallbladder is normal in appearance. Pancreas: Multiple low-attenuation lesions are scattered throughout the pancreas, most of which are subcentimeter in size. The largest of these lesions is in the inferior aspect of the head of the pancreas measuring 11 x 20 mm (axial image 43 of series 3). These appear similar in size, number and distribution to the prior examination. Mild ectasia of the pancreatic duct. No peripancreatic fluid collections or inflammatory changes. Spleen: Unremarkable. Adrenals/Urinary Tract: 1.9 cm low-attenuation lesion in the upper pole of the right kidney is compatible with a simple cyst. Multifocal cortical scarring in the left kidney. No suspicious renal lesions. No hydroureteronephrosis. Urinary bladder is normal in appearance. Bilateral adrenal glands are normal in appearance. Stomach/Bowel: Normal appearance of the stomach. No pathologic dilatation of small bowel or colon. Numerous colonic diverticulae are noted, particularly in the sigmoid colon, without definite surrounding inflammatory changes to suggest an acute diverticulitis at this time. Postoperative changes in the region of the cecum. Status post appendectomy. Vascular/Lymphatic: Aortic atherosclerosis, without evidence of aneurysm or dissection in the abdominal or pelvic vasculature. IVC filter in place with tip well below the level of the renal veins. No lymphadenopathy noted in the abdomen or pelvis. Reproductive: Multiple coarse  calcifications are again noted in the uterus, presumably calcified fibroids. Ovaries are unremarkable in appearance. Other: Small left inguinal hernia containing only fat. No significant volume of ascites. No pneumoperitoneum. Musculoskeletal: There are no aggressive appearing lytic or blastic lesions noted in the visualized portions of the skeleton. IMPRESSION: 1. Severe colonic diverticulosis. No surrounding inflammatory changes are appreciated at this time to indicate an acute diverticulitis. 2. Fibroid uterus. 3. Aortic atherosclerosis. 4. Multiple small low-attenuation lesions scattered throughout the pancreas, similar in size, number and pattern of distribution to prior studies dating back to at least 12/01/2015. Given the stability of these lesions and  the lack of interval growth, in a patient greater than 84 years of age, these are presumed to be benign and no future imaging followup is recommended. This recommendation follows ACR consensus guidelines: Management of Incidental Pancreatic Cysts: A White Paper of the ACR Incidental Findings Committee. [place] 2542;[id number]. 5. Additional incidental findings, as above. Electronically Signed   By: Vinnie Langton M.D.   On: 01/30/2018 20:39      Impression / Plan:   Jill Bowers is a 84 y.o. y/o female with hematochezia, with last colonoscopy being February 2018 due to rectal bleeding revealing diverticulosis but no active bleeding, and symptoms attributed to diverticular bleeding  Due to ongoing hematochezia, and report of abdominal pain in the midepigastric region above baseline, EGD and colonoscopy indicated to evaluate for source of bleeding  Likely diverticular in nature, but as patient is a poor historian and daughter feels that her midepigastric pain is worse than it has been before, will also evaluate for any peptic ulcer disease or esophagitis with EGD since she has never had one before  In addition, she has history of colon  cancer in 2016 and is status post surgery, but lymph nodes were positive and they chose not to undergo chemotherapy.  We discussed options of procedures versus conservative management in detail with the patient and the family.  They would like to proceed with EGD and colonoscopy, and risks and benefits were discussed in detail with them.  PPI IV twice daily  Continue serial CBCs and transfuse PRN Avoid NSAIDs Maintain 2 large-bore IV lines Please page GI with any acute hemodynamic changes, or signs of active GI bleeding  Patient was asked to hydrate well along with her prep If EGD shows an ulcer that explains her hematochezia, we may not need to do her colonoscopy  Anesthesia staff will be sedating the patient for the procedure, and if they are agreeable to proceeding after evaluating the patient, we can proceed with EGD and colonoscopy tomorrow  Clear liquid diet today N.p.o. after breakfast tomorrow morning  Thank you for involving me in the care of this patient.      LOS: 1 day   Virgel Manifold, MD  01/31/2018, 2:04 PM

## 2018-01-31 NOTE — Progress Notes (Signed)
Rapid Response Event Note  Overview:pt lying in bed just had a large bloody BM , daughter at bedside pt receiving maintiance fluids      Initial Focused Assessment:pt  BP and vitals WNL pt alert the patient placed on 2lnc for comfort.pt had another stool while at bedside.    Interventions:pt had repeat vital signs WNL MD notified  Plan of Care (if not transferred): Transfuse 1 unit PRBC Place on cardiac monitoring Monitor VS. Event Summary:   at  2321    at          [facility]

## 2018-02-01 ENCOUNTER — Inpatient Hospital Stay: Payer: Medicare Other | Admitting: Anesthesiology

## 2018-02-01 ENCOUNTER — Encounter: Payer: Self-pay | Admitting: Student

## 2018-02-01 ENCOUNTER — Encounter
Admission: EM | Disposition: A | Discharge status: Home Health | Payer: Self-pay | Source: Home / Self Care | Attending: Internal Medicine

## 2018-02-01 DIAGNOSIS — K921 Melena: Secondary | ICD-10-CM | POA: Diagnosis not present

## 2018-02-01 DIAGNOSIS — K3189 Other diseases of stomach and duodenum: Secondary | ICD-10-CM | POA: Diagnosis not present

## 2018-02-01 DIAGNOSIS — I2510 Atherosclerotic heart disease of native coronary artery without angina pectoris: Secondary | ICD-10-CM | POA: Diagnosis not present

## 2018-02-01 DIAGNOSIS — I10 Essential (primary) hypertension: Secondary | ICD-10-CM | POA: Diagnosis not present

## 2018-02-01 DIAGNOSIS — K5730 Diverticulosis of large intestine without perforation or abscess without bleeding: Secondary | ICD-10-CM

## 2018-02-01 DIAGNOSIS — K625 Hemorrhage of anus and rectum: Secondary | ICD-10-CM | POA: Diagnosis not present

## 2018-02-01 DIAGNOSIS — K317 Polyp of stomach and duodenum: Secondary | ICD-10-CM

## 2018-02-01 DIAGNOSIS — Z66 Do not resuscitate: Secondary | ICD-10-CM | POA: Diagnosis not present

## 2018-02-01 DIAGNOSIS — R55 Syncope and collapse: Secondary | ICD-10-CM | POA: Diagnosis not present

## 2018-02-01 DIAGNOSIS — K5731 Diverticulosis of large intestine without perforation or abscess with bleeding: Secondary | ICD-10-CM | POA: Diagnosis not present

## 2018-02-01 DIAGNOSIS — K922 Gastrointestinal hemorrhage, unspecified: Secondary | ICD-10-CM | POA: Diagnosis not present

## 2018-02-01 DIAGNOSIS — K5790 Diverticulosis of intestine, part unspecified, without perforation or abscess without bleeding: Secondary | ICD-10-CM | POA: Diagnosis not present

## 2018-02-01 DIAGNOSIS — D649 Anemia, unspecified: Secondary | ICD-10-CM | POA: Diagnosis not present

## 2018-02-01 DIAGNOSIS — E875 Hyperkalemia: Secondary | ICD-10-CM | POA: Diagnosis not present

## 2018-02-01 DIAGNOSIS — C779 Secondary and unspecified malignant neoplasm of lymph node, unspecified: Secondary | ICD-10-CM | POA: Diagnosis not present

## 2018-02-01 DIAGNOSIS — I5032 Chronic diastolic (congestive) heart failure: Secondary | ICD-10-CM | POA: Diagnosis not present

## 2018-02-01 DIAGNOSIS — I110 Hypertensive heart disease with heart failure: Secondary | ICD-10-CM | POA: Diagnosis not present

## 2018-02-01 HISTORY — PX: COLONOSCOPY: SHX5424

## 2018-02-01 HISTORY — PX: ESOPHAGOGASTRODUODENOSCOPY: SHX5428

## 2018-02-01 LAB — CBC
HEMATOCRIT: 30.3 % — AB (ref 35.0–47.0)
Hemoglobin: 10.5 g/dL — ABNORMAL LOW (ref 12.0–16.0)
MCH: 31 pg (ref 26.0–34.0)
MCHC: 34.5 g/dL (ref 32.0–36.0)
MCV: 90 fL (ref 80.0–100.0)
Platelets: 212 10*3/uL (ref 150–440)
RBC: 3.37 MIL/uL — AB (ref 3.80–5.20)
RDW: 13.9 % (ref 11.5–14.5)
WBC: 9 10*3/uL (ref 3.6–11.0)

## 2018-02-01 LAB — BASIC METABOLIC PANEL
Anion gap: 3 — ABNORMAL LOW (ref 5–15)
BUN: 23 mg/dL (ref 8–23)
CHLORIDE: 108 mmol/L (ref 98–111)
CO2: 29 mmol/L (ref 22–32)
Calcium: 8.4 mg/dL — ABNORMAL LOW (ref 8.9–10.3)
Creatinine, Ser: 0.99 mg/dL (ref 0.44–1.00)
GFR calc Af Amer: 58 mL/min — ABNORMAL LOW (ref >60)
GFR calc non Af Amer: 50 mL/min — ABNORMAL LOW (ref >60)
GLUCOSE: 112 mg/dL — AB (ref 70–99)
POTASSIUM: 5.1 mmol/L (ref 3.5–5.1)
Sodium: 140 mmol/L (ref 135–145)

## 2018-02-01 LAB — TROPONIN I: Troponin I: <0.03 ng/mL (ref <0.03)

## 2018-02-01 LAB — ECHOCARDIOGRAM COMPLETE
Height: 63 in
WEIGHTICAEL: 3301.61 [oz_av]

## 2018-02-01 LAB — GLUCOSE, CAPILLARY
GLUCOSE-CAPILLARY: 108 mg/dL — AB (ref 70–99)
Glucose-Capillary: 102 mg/dL — ABNORMAL HIGH (ref 70–99)
Glucose-Capillary: 104 mg/dL — ABNORMAL HIGH (ref 70–99)
Glucose-Capillary: 97 mg/dL (ref 70–99)
Glucose-Capillary: 115 mg/dL — ABNORMAL HIGH (ref 70–99)

## 2018-02-01 SURGERY — EGD (ESOPHAGOGASTRODUODENOSCOPY)
Anesthesia: General

## 2018-02-01 MED ORDER — PROPOFOL 10 MG/ML IV BOLUS
INTRAVENOUS | Status: DC | PRN
  Administered 2018-02-01: 20 mg via INTRAVENOUS
  Administered 2018-02-01: 60 mg via INTRAVENOUS

## 2018-02-01 MED ORDER — PROPOFOL 500 MG/50ML IV EMUL
INTRAVENOUS | Status: DC | PRN
  Administered 2018-02-01: 100 ug/kg/min via INTRAVENOUS

## 2018-02-01 MED ORDER — GLYCOPYRROLATE 0.2 MG/ML IJ SOLN
INTRAMUSCULAR | Status: DC | PRN
  Administered 2018-02-01: 0.2 mg via INTRAVENOUS

## 2018-02-01 MED ORDER — SODIUM CHLORIDE 0.9 % IV SOLN
INTRAVENOUS | Status: DC | PRN
  Administered 2018-02-01: 14:00:00 via INTRAVENOUS

## 2018-02-01 MED ORDER — LIDOCAINE HCL (CARDIAC) PF 100 MG/5ML IV SOSY
PREFILLED_SYRINGE | INTRAVENOUS | Status: DC | PRN
  Administered 2018-02-01: 40 mg via INTRAVENOUS

## 2018-02-01 MED ORDER — PHENYLEPHRINE HCL 10 MG/ML IJ SOLN
INTRAMUSCULAR | Status: DC | PRN
  Administered 2018-02-01: 100 ug via INTRAVENOUS

## 2018-02-01 NOTE — Transfer of Care (Signed)
Immediate Anesthesia Transfer of Care Note  Patient: Jill Bowers  Procedure(s) Performed: ESOPHAGOGASTRODUODENOSCOPY (EGD) (N/A ) COLONOSCOPY (N/A )  Patient Location: PACU and Endoscopy Unit  Anesthesia Type:General  Level of Consciousness: drowsy and patient cooperative  Airway & Oxygen Therapy: Patient Spontanous Breathing and Patient connected to face mask oxygen  Post-op Assessment: Report given to RN and Post -op Vital signs reviewed and stable  Post vital signs: Reviewed and stable  Last Vitals:  Vitals Value Taken Time  BP    Temp    Pulse 96 02/01/2018  2:50 PM  Resp 20 02/01/2018  2:50 PM  SpO2 95 % 02/01/2018  2:50 PM  Vitals shown include unvalidated device data.  Last Pain:  Vitals:   02/01/18 1247  TempSrc: Tympanic  PainSc: 0-No pain      Patients Stated Pain Goal: 0 (25/75/05 1833)  Complications: No apparent anesthesia complications

## 2018-02-01 NOTE — Anesthesia Post-op Follow-up Note (Signed)
Anesthesia QCDR form completed.        

## 2018-02-01 NOTE — Op Note (Signed)
[facility] Gastroenterology Patient Name: Jill Bowers Procedure Date: 02/01/2018 1:45 PM MRN: [id number] Account #: [id number] Date of Birth: 11-07-1928 Admit Type: Inpatient Age: 84 Room: [facility] ENDO ROOM 2 Gender: Female Note Status: Finalized Procedure:            Upper GI endoscopy Indications:          Hematochezia Providers:            Adyson Vanburen B. Bonna Gains MD, MD Referring MD:         Jearld Fenton (Referring MD) Medicines:            Monitored Anesthesia Care Complications:        No immediate complications. Procedure:            Pre-Anesthesia Assessment:                       - The risks and benefits of the procedure and the                        sedation options and risks were discussed with the                        patient. All questions were answered and informed                        consent was obtained.                       - Patient identification and proposed procedure were                        verified prior to the procedure.                       - ASA Grade Assessment: III - A patient with severe                        systemic disease.                       After obtaining informed consent, the endoscope was                        passed under direct vision. Throughout the procedure,                        the patient's blood pressure, pulse, and oxygen                        saturations were monitored continuously. The Endoscope                        was introduced through the mouth, and advanced to the                        second part of duodenum. The upper GI endoscopy was                        accomplished with ease. The patient tolerated the  procedure well. Findings:      The gastroesophageal junction was normal.      The examined esophagus was normal.      The entire examined stomach was normal.      A single 5 mm mucosal nodule with a localized distribution was found in       the second portion of  the duodenum. This was present in the duodenal       sweep. This may have been a suction artifact as it was unable to be       located again for biopsies despite multiple sweeps of the area.      No old or fresh blood present throughout the exam. No ulcers or       inflammation throughout the exam. Impression:           - Normal gastroesophageal junction.                       - Normal esophagus.                       - Normal stomach.                       - Mucosal nodule found in the duodenum. Possible                        suction artifact as detailed above.                       - No old or fresh blood present throughout the exam. No                        ulcers or inflammation throughout the exam.                       - No specimens collected. Recommendation:       - Continue Serial CBCs and transfuse PRN                       - Perform a colonoscopy today.                       - Ok to discontinue PPI                       - Observe patient's clinical course.                       - Return patient to hospital ward for ongoing care.                       - The findings and recommendations were discussed with                        the patient.                       - The findings and recommendations were discussed with                        the patient's family.                       -  Return to my office in 2 weeks.                       - Return to primary care physician in 2 weeks. Procedure Code(s):    --- Professional ---                       [phone], Esophagogastroduodenoscopy, flexible, transoral;                        diagnostic, including collection of specimen(s) by                        brushing or washing, when performed (separate procedure) Diagnosis Code(s):    --- Professional ---                       K31.89, Other diseases of stomach and duodenum                       K92.1, Melena (includes Hematochezia) CPT copyright 2017 [employer] Association. All  rights reserved. The codes documented in this report are preliminary and upon coder review may  be revised to meet current compliance requirements.  Vonda Antigua, MD Margretta Sidle B. Bonna Gains MD, MD 02/01/2018 2:04:20 PM This report has been signed electronically. Number of Addenda: 0 Note Initiated On: 02/01/2018 1:45 PM      [facility]

## 2018-02-01 NOTE — Progress Notes (Signed)
[facility] [place] NAME: Jill Bowers    MR#:  [id number]  DATE OF BIRTH:  01-Feb-1929  SUBJECTIVE:   Patient experienced atypical chest pain yesterday.  Patient was eval by GI and cardiology yesterday.  Patient has no chest pain today.  Patient has prep for colonoscopy.  No blood remains in bowel movements. She has some lower quadrant abdominal pain.  REVIEW OF SYSTEMS:    Review of Systems  Constitutional: Negative for fever, chills weight loss HENT: Negative for ear pain, nosebleeds, congestion, facial swelling, rhinorrhea, neck pain, neck stiffness and ear discharge.   Respiratory: Negative for cough, shortness of breath, wheezing  Cardiovascular: Negative for chest pain, palpitations and leg swelling.  Gastrointestinal: Positive lower quadrant abdominal pain without hematochezia or melena Genitourinary: Negative for dysuria, urgency, frequency, hematuria Musculoskeletal: Negative for back pain or joint pain Neurological: Negative for dizziness, seizures, syncope, focal weakness,  numbness and headaches.  Hematological: Does not bruise/bleed easily.  Psychiatric/Behavioral: Negative for hallucinations, confusion, dysphoric mood    Tolerating Diet: npo      DRUG ALLERGIES:   Allergies  Allergen Reactions  . Gabapentin Other (See Comments)    Nightmares   . Reglan [Metoclopramide] Other (See Comments)  . Statins Other (See Comments)  . Torsemide Other (See Comments)    VITALS:  Blood pressure 93/74, pulse 69, temperature 98.3 F (36.8 C), temperature source Oral, resp. rate 18, height 5\' 3"  (1.6 m), weight 215 lb (97.5 kg), SpO2 99 %.  PHYSICAL EXAMINATION:  Constitutional: Appears well-developed and well-nourished. No distress.  Obese HENT: Normocephalic. Marland Kitchen Oropharynx is clear and moist.  Eyes: Conjunctivae and EOM are normal. PERRLA, no scleral icterus.  Neck: Normal ROM. Neck supple. No JVD. No tracheal  deviation. CVS: RRR, S1/S2 +, no murmurs, no gallops, no carotid bruit.  Pulmonary: Effort and breath sounds normal, no stridor, rhonchi, wheezes, rales.  Abdominal: Adequate bowel sounds with minimal tenderness lower quadrant right and left without rebound or guarding. musculoskeletal: Normal range of motion. No edema and no tenderness.  Neuro: Alert. CN 2-12 grossly intact. No focal deficits. Skin: Skin is warm and dry. No rash noted. Psychiatric: Normal mood and affect.      LABORATORY PANEL:   CBC Recent Labs  Lab 02/01/18 0243  WBC 9.0  HGB 10.5*  HCT 30.3*  PLT 212   ------------------------------------------------------------------------------------------------------------------  Chemistries  Recent Labs  Lab 01/31/18 0523 02/01/18 0243  NA 145 140  K 5.9* 5.1  CL 112* 108  CO2 26 29  GLUCOSE 120* 112*  BUN 22 23  CREATININE 1.09* 0.99  CALCIUM 8.7* 8.4*  MG 1.9  --   AST 16  --   ALT 6  --   ALKPHOS 54  --   BILITOT 0.5  --    ------------------------------------------------------------------------------------------------------------------  Cardiac Enzymes Recent Labs  Lab 01/31/18 1506 01/31/18 2115 02/01/18 0243  TROPONINI <0.03 <0.03 <0.03   ------------------------------------------------------------------------------------------------------------------  RADIOLOGY:  Ct Head Wo Contrast  Result Date: 01/31/2018 CLINICAL DATA:  84 y/o F; loss of consciousness and altered mental status. History of colon cancer. EXAM: CT HEAD WITHOUT CONTRAST TECHNIQUE: Contiguous axial images were obtained from the base of the skull through the vertex without intravenous contrast. COMPARISON:  08/03/2017 PET-CT. FINDINGS: Brain: No evidence of acute infarction, hemorrhage, hydrocephalus, extra-axial collection or mass lesion/mass effect. Nonspecific foci of white matter hypoattenuation are compatible with chronic microvascular ischemic changes. There are very small  chronic lacunar infarcts within  the right putamen, bilateral caudate heads, and bilateral thalami. Mild diffuse volume loss of the brain. Vascular: Calcific atherosclerosis of carotid siphons. No hyperdense vessel. Skull: Normal. Negative for fracture or focal lesion. Sinuses/Orbits: No acute finding. Other: None. IMPRESSION: 1. No acute intracranial abnormality identified. 2. Mild chronic microvascular ischemic changes and volume loss of the brain for age. Small chronic lacunar infarcts in the basal ganglia. Electronically Signed   By: Kristine Garbe M.D.   On: 01/31/2018 01:30   Ct Abdomen Pelvis W Contrast  Result Date: 01/30/2018 CLINICAL DATA:  84 year old female with history of bright red blood per rectum today. History of colon cancer status post colonic resection. EXAM: CT ABDOMEN AND PELVIS WITH CONTRAST TECHNIQUE: Multidetector CT imaging of the abdomen and pelvis was performed using the standard protocol following bolus administration of intravenous contrast. CONTRAST:  49mL OMNIPAQUE IOHEXOL 300 MG/ML  SOLN COMPARISON:  PET/CT 08/03/2017. FINDINGS: Lower chest: Heart size is mildly enlarged. Markedly elevated right hemidiaphragm. Hepatobiliary: There are multiple well-defined low-attenuation lesions in the liver, largest of which measures up to 3.3 x 2.0 cm adjacent to the gallbladder fossa in segments 5/8 (axial image 20 of series 3), compatible with simple cysts. Other subcentimeter low-attenuation lesions in the liver are too small to definitively characterize, but are also favored to represent small cysts. No suspicious hepatic lesions. No intra or extrahepatic biliary ductal dilatation. Gallbladder is normal in appearance. Pancreas: Multiple low-attenuation lesions are scattered throughout the pancreas, most of which are subcentimeter in size. The largest of these lesions is in the inferior aspect of the head of the pancreas measuring 11 x 20 mm (axial image 43 of series 3). These  appear similar in size, number and distribution to the prior examination. Mild ectasia of the pancreatic duct. No peripancreatic fluid collections or inflammatory changes. Spleen: Unremarkable. Adrenals/Urinary Tract: 1.9 cm low-attenuation lesion in the upper pole of the right kidney is compatible with a simple cyst. Multifocal cortical scarring in the left kidney. No suspicious renal lesions. No hydroureteronephrosis. Urinary bladder is normal in appearance. Bilateral adrenal glands are normal in appearance. Stomach/Bowel: Normal appearance of the stomach. No pathologic dilatation of small bowel or colon. Numerous colonic diverticulae are noted, particularly in the sigmoid colon, without definite surrounding inflammatory changes to suggest an acute diverticulitis at this time. Postoperative changes in the region of the cecum. Status post appendectomy. Vascular/Lymphatic: Aortic atherosclerosis, without evidence of aneurysm or dissection in the abdominal or pelvic vasculature. IVC filter in place with tip well below the level of the renal veins. No lymphadenopathy noted in the abdomen or pelvis. Reproductive: Multiple coarse calcifications are again noted in the uterus, presumably calcified fibroids. Ovaries are unremarkable in appearance. Other: Small left inguinal hernia containing only fat. No significant volume of ascites. No pneumoperitoneum. Musculoskeletal: There are no aggressive appearing lytic or blastic lesions noted in the visualized portions of the skeleton. IMPRESSION: 1. Severe colonic diverticulosis. No surrounding inflammatory changes are appreciated at this time to indicate an acute diverticulitis. 2. Fibroid uterus. 3. Aortic atherosclerosis. 4. Multiple small low-attenuation lesions scattered throughout the pancreas, similar in size, number and pattern of distribution to prior studies dating back to at least 12/01/2015. Given the stability of these lesions and the lack of interval growth, in a  patient greater than 7 years of age, these are presumed to be benign and no future imaging followup is recommended. This recommendation follows ACR consensus guidelines: Management of Incidental Pancreatic Cysts: A White Paper of the ACR Incidental  Findings Committee. [place] 7014;10:301-314. 5. Additional incidental findings, as above. Electronically Signed   By: Vinnie Langton M.D.   On: 01/30/2018 20:39     ASSESSMENT AND PLAN:    84 year old female with a history of colon cancer and prior GI bleed who presented to the ER due to GI rectal bleeding  1.  Bright red blood per rectum, likely diverticular in nature: Patient is status post 1 unit PRBC after large bloody bowel movement Hemoglobin remains stable Plan for EGD and colonoscopy today CT scan shows diverticulosis without diverticulitis. Continue PPI CBC for a.m.  2.  Presyncope after large bloody bowel movement and atypical chest pain: She has been evaluated by cardiology.  Echocardiogram results are pending.  Troponins are negative.  CT head was negative for acute pathology. No further cardiac work-up indicated  3  Hyperkalemia: Insulin and dextrose along with Lasix given  4.  Chronic diastolic heart failure without signs of exacerbation  PT evaluation after procedures   Management plans discussed with the patient and family and they are  in agreement.  CODE STATUS: DNR  TOTAL TIME TAKING CARE OF THIS PATIENT: 26 minutes.     POSSIBLE D/C1- 2 days, DEPENDING ON CLINICAL CONDITION.   Laddie Naeem M.D on 02/01/2018 at 11:00 AM  Between 7am to 6pm - Pager - [phone] After 6pm go to [email or web address] - password [email or web address] [facility]  Office  [phone]  CC: Primary care physician; Jearld Fenton, NP  Note: This dictation was prepared with Dragon dictation along with smaller phrase technology. Any transcriptional errors that result from this process are unintentional.

## 2018-02-01 NOTE — Progress Notes (Signed)
Patient able to drink at least 3/4 of bowel prep. Bowel movements liquid with dark red color. Hemoglobin rechecked. Stable. Complaint of chest pain once. Given morphine. Resolved.

## 2018-02-01 NOTE — Anesthesia Preprocedure Evaluation (Signed)
Anesthesia Evaluation  Patient identified by MRN, date of birth, ID band Patient awake    Reviewed: Allergy & Precautions, H&P , NPO status , reviewed documented beta blocker date and time   Airway Mallampati: III  TM Distance: >3 FB Neck ROM: limited    Dental  (+) Edentulous Upper, Edentulous Lower   Pulmonary    Pulmonary exam normal        Cardiovascular hypertension, + CAD, + Peripheral Vascular Disease and +CHF  Normal cardiovascular exam  01/31/2018 ECHO Study Conclusions  - Left ventricle: The cavity size was normal. Wall thickness was   increased in a pattern of moderate LVH. Systolic function was   vigorous. The estimated ejection fraction was in the range of 65%   to 70%. Doppler parameters are consistent with abnormal left   ventricular relaxation (grade 1 diastolic dysfunction). - Mitral valve: There was mild regurgitation. - Left atrium: The atrium was mildly dilated. - Right ventricle: The cavity size was normal. Right ventricle   appears hypertrophied, particularly near the apex. Systolic   function was normal.   Neuro/Psych    GI/Hepatic GERD  ,  Endo/Other    Renal/GU Renal disease     Musculoskeletal  (+) Arthritis ,   Abdominal   Peds  Hematology   Anesthesia Other Findings Past Medical History: No date: CHF (congestive heart failure) (HCC) No date: Colon cancer ([place]) No date: Coronary artery disease No date: DVT (deep venous thrombosis) (HCC) No date: GERD (gastroesophageal reflux disease) No date: Hypertension No date: Malignant neoplasm of stomach (HCC) No date: PE (pulmonary embolism) No date: Renal disorder No date: Renal insufficiency  Past Surgical History: No date: ABDOMINAL HYSTERECTOMY No date: COLON SURGERY 08/23/2016: COLONOSCOPY WITH PROPOFOL; N/A     Comment:  Procedure: COLONOSCOPY WITH PROPOFOL;  Surgeon: Jonathon Bellows, MD;  Location: [facility] ENDOSCOPY;   Service:               Gastroenterology;  Laterality: N/A; 08/24/2016: COLONOSCOPY WITH PROPOFOL; N/A     Comment:  Procedure: COLONOSCOPY WITH PROPOFOL;  Surgeon: Jonathon Bellows, MD;  Location: [facility] ENDOSCOPY;  Service: Endoscopy;              Laterality: N/A; No date: IVC FILTER PLACEMENT ([facility] HX) No date: JOINT REPLACEMENT; Right     Comment:  knee  BMI    Body Mass Index:  38.09 kg/m      Reproductive/Obstetrics                             Anesthesia Physical Anesthesia Plan  ASA: IV  Anesthesia Plan: General   Post-op Pain Management:    Induction: Intravenous  PONV Risk Score and Plan: 3 and Treatment may vary due to age or medical condition and TIVA  Airway Management Planned: Nasal Cannula and Natural Airway  Additional Equipment:   Intra-op Plan:   Post-operative Plan:   Informed Consent: I have reviewed the patients History and Physical, chart, labs and discussed the procedure including the risks, benefits and alternatives for the proposed anesthesia with the patient or authorized representative who has indicated his/her understanding and acceptance.   Dental Advisory Given  Plan Discussed with: CRNA  Anesthesia Plan Comments:         Anesthesia Quick Evaluation

## 2018-02-01 NOTE — Progress Notes (Signed)
PT Cancellation Note  Patient Details Name: Jill Bowers MRN: [id number] DOB: [birth year]   Cancelled Treatment:    Reason Eval/Treat Not Completed: Patient at procedure or test/unavailable. Order received and chart reviewed. Pt currently out of room. PT evaluation will be performed at later date/time as pt is available.  Lyndel Safe Levy Cedano PT, DPT, GCS  Atreus Hasz 02/01/2018, 1:20 PM

## 2018-02-01 NOTE — Op Note (Signed)
[facility] Gastroenterology Patient Name: Jill Bowers Procedure Date: 02/01/2018 1:44 PM MRN: [id number] Account #: [id number] Date of Birth: [birth year] Admit Type: Inpatient Age: 84 Room: [facility] ENDO ROOM 2 Gender: Female Note Status: Finalized Procedure:            Colonoscopy Indications:          Hematochezia Providers:            Jaleea Alesi B. Bonna Gains MD, MD Referring MD:         Jearld Fenton (Referring MD) Medicines:            Monitored Anesthesia Care Complications:        No immediate complications. Procedure:            Pre-Anesthesia Assessment:                       - Prior to the procedure, a History and Physical was                        performed, and patient medications, allergies and                        sensitivities were reviewed. The patient's tolerance of                        previous anesthesia was reviewed.                       - The risks and benefits of the procedure and the                        sedation options and risks were discussed with the                        patient. All questions were answered and informed                        consent was obtained.                       - Patient identification and proposed procedure were                        verified prior to the procedure by the physician, the                        nurse, the anesthesiologist, the anesthetist and the                        technician. The procedure was verified in the                        pre-procedure area in the procedure room in the                        endoscopy suite.                       - Prophylactic Antibiotics: The patient does not  require prophylactic antibiotics.                       - ASA Grade Assessment: III - A patient with severe                        systemic disease.                       - After reviewing the risks and benefits, the patient                        was deemed in satisfactory  condition to undergo the                        procedure.                       - Monitored anesthesia care was determined to be                        medically necessary for this procedure based on review                        of the patient's medical history, medications, and                        prior anesthesia history.                       - The anesthesia plan was to use monitored anesthesia                        care (MAC).                       After obtaining informed consent, the colonoscope was                        passed under direct vision. Throughout the procedure,                        the patient's blood pressure, pulse, and oxygen                        saturations were monitored continuously. The                        Colonoscope was introduced through the anus and                        advanced to the the cecum, identified by appendiceal                        orifice and ileocecal valve. The colonoscopy was                        performed with ease. The patient tolerated the                        procedure well. The quality of the bowel preparation  was poor. Findings:      The perianal and digital rectal examinations were normal.      Stool balls partially covered with old blood was found in the entire       colon. The stool balls were moved with water jet to ensure no active       bleeding was present underneath the area.      Many small and large-mouthed diverticula were found in the sigmoid colon       and descending colon.      No active bleeding present throughout the exam. No fresh blood present.      Internal hemorrhoids were found during retroflexion. Impression:           - Preparation of the colon was poor.                       - Stool balls partially covered with old Blood in the                        entire examined colon.                       - Diverticulosis in the sigmoid colon and in the                         descending colon.                       - No active bleeding present throughout the exam. No                        fresh blood present.                       - No specimens collected.                       - The stools balls partially covered with blood are                        evidence of GI bleed being from diverticulosis.                       None of them are actively bleeding.                       As expected, the culprit diverticulum that bled was not                        identified , since the bleeding has resolved at this                        time.                       - This colonoscopy is not for CRC screening and was not                        an adequate prep to find and remove polyps. Recommendation:       - Return patient to hospital ward for ongoing care.                       -  Refer to a surgeon due to history of diverticular                        bleeding.                       - High fiber diet.                       - Miralax BID                       Avoid constipation                       - Continue Serial CBCs and transfuse PRN                       - The findings and recommendations were discussed with                        the patient.                       - The findings and recommendations were discussed with                        the patient's family. Procedure Code(s):    --- Professional ---                       [phone], Colonoscopy, flexible; diagnostic, including                        collection of specimen(s) by brushing or washing, when                        performed (separate procedure) Diagnosis Code(s):    --- Professional ---                       K92.2, Gastrointestinal hemorrhage, unspecified                       K92.1, Melena (includes Hematochezia)                       K57.30, Diverticulosis of large intestine without                        perforation or abscess without bleeding CPT copyright 2017 American Medical  Association. All rights reserved. The codes documented in this report are preliminary and upon coder review may  be revised to meet current compliance requirements.  Vonda Antigua, MD Margretta Sidle B. Bonna Gains MD, MD 02/01/2018 2:49:46 PM This report has been signed electronically. Number of Addenda: 0 Note Initiated On: 02/01/2018 1:44 PM Scope Withdrawal Time: 0 hours 14 minutes 56 seconds  Total Procedure Duration: 0 hours 31 minutes 38 seconds  Estimated Blood Loss: Estimated blood loss: none.      [facility]

## 2018-02-01 NOTE — Anesthesia Postprocedure Evaluation (Signed)
Anesthesia Post Note  Patient: Jill Bowers  Procedure(s) Performed: ESOPHAGOGASTRODUODENOSCOPY (EGD) (N/A ) COLONOSCOPY (N/A )  Patient location during evaluation: Endoscopy Anesthesia Type: General Level of consciousness: patient cooperative, awake and alert and oriented Pain management: satisfactory to patient Vital Signs Assessment: post-procedure vital signs reviewed and stable Respiratory status: spontaneous breathing and respiratory function stable Cardiovascular status: blood pressure returned to baseline and stable Postop Assessment: no headache, no backache, patient able to bend at knees, no apparent nausea or vomiting and adequate PO intake Anesthetic complications: no     Last Vitals:  Vitals:   02/01/18 1247 02/01/18 1450  BP: (!) 128/42 (!) (P) 157/85  Pulse: 64 (P) 97  Resp: 20 (P) 20  Temp: (!) 35.9 C (!) (P) 36.1 C  SpO2: 99% (P) 95%    Last Pain:  Vitals:   02/01/18 1450  TempSrc: (P) Tympanic  PainSc:                  [name]

## 2018-02-02 ENCOUNTER — Inpatient Hospital Stay: Payer: Medicare Other

## 2018-02-02 DIAGNOSIS — K921 Melena: Secondary | ICD-10-CM | POA: Diagnosis not present

## 2018-02-02 DIAGNOSIS — R55 Syncope and collapse: Secondary | ICD-10-CM

## 2018-02-02 DIAGNOSIS — D649 Anemia, unspecified: Secondary | ICD-10-CM | POA: Diagnosis not present

## 2018-02-02 DIAGNOSIS — I110 Hypertensive heart disease with heart failure: Secondary | ICD-10-CM | POA: Diagnosis not present

## 2018-02-02 DIAGNOSIS — I10 Essential (primary) hypertension: Secondary | ICD-10-CM | POA: Diagnosis not present

## 2018-02-02 DIAGNOSIS — I6522 Occlusion and stenosis of left carotid artery: Secondary | ICD-10-CM | POA: Diagnosis not present

## 2018-02-02 DIAGNOSIS — R42 Dizziness and giddiness: Secondary | ICD-10-CM | POA: Diagnosis not present

## 2018-02-02 DIAGNOSIS — Z66 Do not resuscitate: Secondary | ICD-10-CM | POA: Diagnosis not present

## 2018-02-02 DIAGNOSIS — K625 Hemorrhage of anus and rectum: Secondary | ICD-10-CM | POA: Diagnosis not present

## 2018-02-02 DIAGNOSIS — K5790 Diverticulosis of intestine, part unspecified, without perforation or abscess without bleeding: Secondary | ICD-10-CM | POA: Diagnosis not present

## 2018-02-02 DIAGNOSIS — R109 Unspecified abdominal pain: Secondary | ICD-10-CM | POA: Diagnosis not present

## 2018-02-02 DIAGNOSIS — C779 Secondary and unspecified malignant neoplasm of lymph node, unspecified: Secondary | ICD-10-CM | POA: Diagnosis not present

## 2018-02-02 DIAGNOSIS — E875 Hyperkalemia: Secondary | ICD-10-CM | POA: Diagnosis not present

## 2018-02-02 DIAGNOSIS — K5731 Diverticulosis of large intestine without perforation or abscess with bleeding: Secondary | ICD-10-CM | POA: Diagnosis not present

## 2018-02-02 DIAGNOSIS — I5032 Chronic diastolic (congestive) heart failure: Secondary | ICD-10-CM | POA: Diagnosis not present

## 2018-02-02 LAB — CBC
HEMATOCRIT: 29.2 % — AB (ref 35.0–47.0)
HEMOGLOBIN: 9.8 g/dL — AB (ref 12.0–16.0)
MCH: 30.4 pg (ref 26.0–34.0)
MCHC: 33.5 g/dL (ref 32.0–36.0)
MCV: 90.9 fL (ref 80.0–100.0)
Platelets: 222 10*3/uL (ref 150–440)
RBC: 3.21 MIL/uL — ABNORMAL LOW (ref 3.80–5.20)
RDW: 13.9 % (ref 11.5–14.5)
WBC: 8.5 10*3/uL (ref 3.6–11.0)

## 2018-02-02 LAB — GLUCOSE, CAPILLARY
GLUCOSE-CAPILLARY: 98 mg/dL (ref 70–99)
Glucose-Capillary: 87 mg/dL (ref 70–99)
Glucose-Capillary: 129 mg/dL — ABNORMAL HIGH (ref 70–99)
Glucose-Capillary: 117 mg/dL — ABNORMAL HIGH (ref 70–99)
Glucose-Capillary: 118 mg/dL — ABNORMAL HIGH (ref 70–99)

## 2018-02-02 MED ORDER — FUROSEMIDE 10 MG/ML IJ SOLN
20.0000 mg | Freq: Once | INTRAMUSCULAR | Status: AC
  Administered 2018-02-02: 20 mg via INTRAVENOUS
  Filled 2018-02-02: qty 2

## 2018-02-02 NOTE — Care Management Important Message (Signed)
Copy of signed IM left with patient and daughter in room.  

## 2018-02-02 NOTE — Procedures (Signed)
ELECTROENCEPHALOGRAM REPORT   Patient: Jill Bowers       Room #: 252A-AA EEG No. ID: [id number] Age: 84 y.o.        Sex: female Referring Physician: Mody Report Date:  02/02/2018        Interpreting Physician: Alexis Goodell  History: Jill Bowers is an 84 y.o. female with recurrent presyncopal symptoms  Medications:  Duragesic, Mag-Ox  Conditions of Recording:  This is a 21 channel routine scalp EEG performed with bipolar and monopolar montages arranged in accordance to the international 10/20 system of electrode placement. One channel was dedicated to EKG recording.  The patient is in the awake state.  Description:  The waking background activity consists of a low voltage, symmetrical, fairly well organized, 9 Hz alpha activity, seen from the parieto-occipital and posterior temporal regions.  Low voltage fast activity, poorly organized, is seen anteriorly and is at times superimposed on more posterior regions.  A mixture of theta and alpha rhythms are seen from the central and temporal regions. The patient does not drowse or sleep. Hyperventilation was not performed.  Intermittent photic stimulation was performed but failed to illicit any change in the tracing.    IMPRESSION: Normal awake electroencephalogram, with activation procedures. There are no focal lateralizing or epileptiform features.   Comment:  An EEG with the patient sleep deprived to elicit drowse and light sleep may be desirable to further elicit a possible seizure disorder.     Alexis Goodell, MD Neurology [phone] 02/02/2018, 3:09 PM

## 2018-02-02 NOTE — Progress Notes (Signed)
Family Meeting Note  Advance Directive:yes  Today a meeting took place with the daughter, POA. The following clinical team members were present during this meeting:MD  The following were discussed:Patient's diagnosis: Presyncope Abdominal pain Diverticular bleed, Patient's progosis: Unable to determine and Goals for treatment: Continue present management  Additional follow-up to be provided: Family would like to continue current management.  Continue DNR status. Patient's family would like further work-up for presyncope  Time spent during discussion:16 minutes  Shacoya Burkhammer, MD

## 2018-02-02 NOTE — Progress Notes (Signed)
   Jill Antigua, MD [address] [place], [place], [place], [address] [address], [place], [place], [place], [address] Phone: [phone]  Fax: [phone]   Subjective: Patient reports mild abdominal discomfort today.  Tolerated oral diet after procedure yesterday.  No further bowel movement since colonoscopy yesterday.   Objective: Exam: Vital signs in last 24 hours: Vitals:   02/01/18 2023 02/02/18 0427 02/02/18 0802 02/02/18 1041  BP: (!) 139/51 (!) 131/54 (!) 141/56 (!) 128/56  Pulse: 72 73 72 69  Resp:   18   Temp: 99.1 F (37.3 C) 99 F (37.2 C) 98.6 F (37 C)   TempSrc: Oral Oral Oral   SpO2: 100% 98% 100% 100%  Weight:  97.8 kg    Height:       Weight change: 0.318 kg  Intake/Output Summary (Last 24 hours) at 02/02/2018 1333 Last data filed at 02/02/2018 1018 Gross per 24 hour  Intake 760 ml  Output 800 ml  Net -40 ml    General: No acute distress, AAO x3 Abd: Soft, NT/ND, No HSM Skin: Warm, no rashes Neck: Supple, Trachea midline   Lab Results: Lab Results  Component Value Date   WBC 8.5 02/02/2018   HGB 9.8 (L) 02/02/2018   HCT 29.2 (L) 02/02/2018   MCV 90.9 02/02/2018   PLT 222 02/02/2018   Micro Results: No results found for this or any previous visit (from the past 240 hour(s)). Studies/Results: Dg Abd Portable 1v  Result Date: 02/02/2018 CLINICAL DATA:  Abdominal discomfort. History of renal insufficiency, colonic malignancy with surgery yesterday. EXAM: PORTABLE ABDOMEN - 1 VIEW COMPARISON:  Abdominal and pelvic CT scan of January 30, 2018 FINDINGS: There is a moderate amount of gas within normal caliber as well as minimally distended loops of small bowel. The gas and stool pattern within the colon and rectum is normal. No free extraluminal gas collections are observed. There is an inferior vena caval filter present to the right of the L4 and L5 vertebral bodies. IMPRESSION: No evidence of bowel obstruction nor definite ileus.  Electronically Signed   By: David  Martinique M.D.   On: 02/02/2018 12:25   Medications:  Scheduled Meds: . fentaNYL  12.5 mcg Transdermal Q72H  . magnesium oxide  400 mg Oral BID  . sodium chloride flush  3 mL Intravenous Q12H   Continuous Infusions: PRN Meds:.acetaminophen **OR** acetaminophen, albuterol, bisacodyl, HYDROcodone-acetaminophen, morphine injection, ondansetron **OR** ondansetron (ZOFRAN) IV, senna-docusate   Assessment: Active Problems:   Atypical chest pain   Diverticulosis of large intestine without diverticulitis   Acute gastrointestinal hemorrhage   Hematochezia   Duodenal nodule    Plan: Hematochezia resolved at this time Source of his hematochezia is diverticulosis as reported in colonoscopy report.  No active bleeding diverticuli seen during the procedure If bleeding reoccurs on this admission, consider RBC scan or CTA depending on rate of bleeding  Abdominal x-ray this morning does not show any ileus or bowel obstruction If patient does not have a bowel movement today, I would recommend MiraLAX daily, and increase to twice daily on discharge due to history of diverticular bleeding Avoid NSAIDs in this elderly patient Continue serial CBCs and transfuse PRN  Consider surgery referral for recurrent diverticular bleeding, last year and then again this year   LOS: 3 days   Jill Antigua, MD 02/02/2018, 1:33 PM

## 2018-02-02 NOTE — Progress Notes (Signed)
PT Cancellation Note  Patient Details Name: Jill Bowers MRN: [id number] DOB: Feb 13, 1929   Cancelled Treatment:    Reason Eval/Treat Not Completed: Medical issues which prohibited therapy PT attempted to see pt multiple times this date.  Each time pt was interested in working with PT and had appropriate vitals going into each attempt.  On 3 separate occassions we attempted to get from supine to sitting with and w/o assist.  Each time she was able to get ~2/3 of the way up and would have drop attacks of going limp, short term "blacking out" and subjective c/o dizziness/vertigo.  This happened both AM and PM, witness by nurse in PM.  Will hold PT exam this date and try back when pt is more able to participate.  Kreg Shropshire, DPT 02/02/2018, 4:28 PM

## 2018-02-02 NOTE — Progress Notes (Signed)
[facility] NAME: Jill Bowers    MR#:  [id number]  DATE OF BIRTH:  1928/11/11  SUBJECTIVE:   Patient was working with PT today when she had 2 presyncopal episodes Family was presents She was feeling light headed No CP She has pain all over This am had abdominal pain No hematocheizia  REVIEW OF SYSTEMS:    Review of Systems  Constitutional: Negative for fever, chills weight loss HENT: Negative for ear pain, nosebleeds, congestion, facial swelling, rhinorrhea, neck pain, neck stiffness and ear discharge.   Respiratory: Negative for cough, shortness of breath, wheezing  Cardiovascular: Negative for chest pain, palpitations and leg swelling.  Gastrointestinal: Positive lower quadrant abdominal pain without hematochezia or melena Genitourinary: Negative for dysuria, urgency, frequency, hematuria Musculoskeletal: Negative for back pain or joint pain Neurological: +lighthead presyncope  Hematological: Does not bruise/bleed easily.  Psychiatric/Behavioral: Negative for hallucinations, confusion, dysphoric mood    Tolerating Diet: yes     DRUG ALLERGIES:   Allergies  Allergen Reactions  . Gabapentin Other (See Comments)    Nightmares   . Reglan [Metoclopramide] Other (See Comments)  . Statins Other (See Comments)  . Torsemide Other (See Comments)    VITALS:  Blood pressure (!) 128/56, pulse 69, temperature 98.6 F (37 C), temperature source Oral, resp. rate 18, height 5\' 3"  (1.6 m), weight 97.8 kg, SpO2 100 %.  PHYSICAL EXAMINATION:  Constitutional: Appears well-developed and well-nourished. No distress.  Obese HENT: Normocephalic. Marland Kitchen Oropharynx is clear and moist.  Eyes: Conjunctivae and EOM are normal. PERRLA, no scleral icterus.  Neck: Normal ROM. Neck supple. No JVD. No tracheal deviation. CVS: RRR, S1/S2 +, no murmurs, no gallops, no carotid bruit.  Pulmonary: Effort and breath sounds normal, no stridor,  rhonchi, wheezes, rales.  Abdominal: Adequate bowel sounds with minimal tenderness lower quadrant right and left without rebound or guarding. musculoskeletal: Normal range of motion. No edema and no tenderness.  Neuro: Alert. CN 2-12 grossly intact. No focal deficits. Skin: Skin is warm and dry. No rash noted. Psychiatric: Normal mood and affect.      LABORATORY PANEL:   CBC Recent Labs  Lab 02/02/18 0540  WBC 8.5  HGB 9.8*  HCT 29.2*  PLT 222   ------------------------------------------------------------------------------------------------------------------  Chemistries  Recent Labs  Lab 01/31/18 0523 02/01/18 0243  NA 145 140  K 5.9* 5.1  CL 112* 108  CO2 26 29  GLUCOSE 120* 112*  BUN 22 23  CREATININE 1.09* 0.99  CALCIUM 8.7* 8.4*  MG 1.9  --   AST 16  --   ALT 6  --   ALKPHOS 54  --   BILITOT 0.5  --    ------------------------------------------------------------------------------------------------------------------  Cardiac Enzymes Recent Labs  Lab 01/31/18 1506 01/31/18 2115 02/01/18 0243  TROPONINI <0.03 <0.03 <0.03   ------------------------------------------------------------------------------------------------------------------  RADIOLOGY:  No results found.   ASSESSMENT AND PLAN:    84 year old female with a history of colon cancer and prior GI bleed who presented to the ER due to GI rectal bleeding  1.  Bright red blood per rectum, which is diverticular in nature: Patient is status post 1 unit PRBC after large bloody bowel movement Hemoglobin remains stable EGD and colonoscopy were essentially in unremarkable.   Follow-up on today's x-ray  2.  Presyncop:  She has been evaluated cardiology prior to her GI procedures.  Echocardiogram was limited with no wall motion abnormalities and normal ejection fraction.  I will order carotid Doppler.  Neurology consultation pending I will also order EEG. Telemetry shows no acute changes.  Troponins  were negative when she had her first presyncopal episode 2 days ago. Check orthostatics Follow CBC and BMP 3  Hyperkalemia: This was treated   4.  Chronic diastolic heart failure without signs of exacerbation  PT evaluation after procedures   Management plans discussed with the patient and family and they are  in agreement.  CODE STATUS: DNR  TOTAL TIME TAKING CARE OF THIS PATIENT: 26 minutes.     POSSIBLE D/C 1- 2 days, DEPENDING ON CLINICAL CONDITION.   Zaine Elsass M.D on 02/02/2018 at 11:29 AM  Between 7am to 6pm - Pager - [phone] After 6pm go to [email or web address] - password [email or web address] [facility]  Office  [phone]  CC: Primary care physician; Jearld Fenton, NP  Note: This dictation was prepared with Dragon dictation along with smaller phrase technology. Any transcriptional errors that result from this process are unintentional.

## 2018-02-02 NOTE — Progress Notes (Signed)
eeg completed ° °

## 2018-02-02 NOTE — Progress Notes (Signed)
Unable to obtain orthostatic vitals at this time d/t safety issues. Patient attempted to sit on side of bed this afternoon with PT and experienced an episode of of "blacking out". This RN was at bedside at the time of episode. See PT note. Will continue to monitor.

## 2018-02-03 ENCOUNTER — Inpatient Hospital Stay: Payer: Medicare Other

## 2018-02-03 ENCOUNTER — Other Ambulatory Visit: Payer: Medicare Other

## 2018-02-03 ENCOUNTER — Ambulatory Visit: Payer: Medicare Other | Admitting: Internal Medicine

## 2018-02-03 DIAGNOSIS — R55 Syncope and collapse: Secondary | ICD-10-CM | POA: Diagnosis not present

## 2018-02-03 DIAGNOSIS — K5730 Diverticulosis of large intestine without perforation or abscess without bleeding: Secondary | ICD-10-CM | POA: Diagnosis not present

## 2018-02-03 DIAGNOSIS — D649 Anemia, unspecified: Secondary | ICD-10-CM | POA: Diagnosis not present

## 2018-02-03 DIAGNOSIS — I10 Essential (primary) hypertension: Secondary | ICD-10-CM | POA: Diagnosis not present

## 2018-02-03 DIAGNOSIS — K5790 Diverticulosis of intestine, part unspecified, without perforation or abscess without bleeding: Secondary | ICD-10-CM | POA: Diagnosis not present

## 2018-02-03 DIAGNOSIS — Z66 Do not resuscitate: Secondary | ICD-10-CM | POA: Diagnosis not present

## 2018-02-03 DIAGNOSIS — K921 Melena: Secondary | ICD-10-CM | POA: Diagnosis not present

## 2018-02-03 DIAGNOSIS — C779 Secondary and unspecified malignant neoplasm of lymph node, unspecified: Secondary | ICD-10-CM | POA: Diagnosis not present

## 2018-02-03 DIAGNOSIS — E875 Hyperkalemia: Secondary | ICD-10-CM | POA: Diagnosis not present

## 2018-02-03 DIAGNOSIS — K5731 Diverticulosis of large intestine without perforation or abscess with bleeding: Secondary | ICD-10-CM | POA: Diagnosis not present

## 2018-02-03 DIAGNOSIS — I5032 Chronic diastolic (congestive) heart failure: Secondary | ICD-10-CM | POA: Diagnosis not present

## 2018-02-03 DIAGNOSIS — M5127 Other intervertebral disc displacement, lumbosacral region: Secondary | ICD-10-CM | POA: Diagnosis not present

## 2018-02-03 DIAGNOSIS — K625 Hemorrhage of anus and rectum: Secondary | ICD-10-CM | POA: Diagnosis not present

## 2018-02-03 DIAGNOSIS — M4807 Spinal stenosis, lumbosacral region: Secondary | ICD-10-CM | POA: Diagnosis not present

## 2018-02-03 DIAGNOSIS — I110 Hypertensive heart disease with heart failure: Secondary | ICD-10-CM | POA: Diagnosis not present

## 2018-02-03 LAB — TYPE AND SCREEN
ABO/RH(D): O POS
ANTIBODY SCREEN: NEGATIVE
UNIT DIVISION: 0
Unit division: 0

## 2018-02-03 LAB — BASIC METABOLIC PANEL
Anion gap: 3 — ABNORMAL LOW (ref 5–15)
BUN: 21 mg/dL (ref 8–23)
CALCIUM: 8.7 mg/dL — AB (ref 8.9–10.3)
CHLORIDE: 103 mmol/L (ref 98–111)
CO2: 32 mmol/L (ref 22–32)
CREATININE: 1.09 mg/dL — AB (ref 0.44–1.00)
GFR, EST AFRICAN AMERICAN: 52 mL/min — AB (ref >60)
GFR, EST NON AFRICAN AMERICAN: 45 mL/min — AB (ref >60)
Glucose, Bld: 119 mg/dL — ABNORMAL HIGH (ref 70–99)
Potassium: 4.9 mmol/L (ref 3.5–5.1)
SODIUM: 139 mmol/L (ref 135–145)

## 2018-02-03 LAB — CBC
HCT: 28.1 % — ABNORMAL LOW (ref 35.0–47.0)
HEMOGLOBIN: 9.4 g/dL — AB (ref 12.0–16.0)
MCH: 30.6 pg (ref 26.0–34.0)
MCHC: 33.5 g/dL (ref 32.0–36.0)
MCV: 91.4 fL (ref 80.0–100.0)
PLATELETS: 217 10*3/uL (ref 150–440)
RBC: 3.07 MIL/uL — ABNORMAL LOW (ref 3.80–5.20)
RDW: 13.5 % (ref 11.5–14.5)
WBC: 8.9 10*3/uL (ref 3.6–11.0)

## 2018-02-03 LAB — BPAM RBC
Blood Product Expiration Date: 201908282359
Blood Product Expiration Date: 201908292359
ISSUE DATE / TIME: 201908060016
UNIT TYPE AND RH: 5100
UNIT TYPE AND RH: 5100

## 2018-02-03 LAB — GLUCOSE, CAPILLARY: GLUCOSE-CAPILLARY: 115 mg/dL — AB (ref 70–99)

## 2018-02-03 LAB — PREPARE RBC (CROSSMATCH)

## 2018-02-03 LAB — HEMOGLOBIN AND HEMATOCRIT, BLOOD
HEMATOCRIT: 29.9 % — AB (ref 35.0–47.0)
Hemoglobin: 10.2 g/dL — ABNORMAL LOW (ref 12.0–16.0)

## 2018-02-03 MED ORDER — SODIUM CHLORIDE 0.9% IV SOLUTION
Freq: Once | INTRAVENOUS | Status: AC
  Administered 2018-02-03: 12:00:00 via INTRAVENOUS

## 2018-02-03 MED ORDER — GADOBENATE DIMEGLUMINE 529 MG/ML IV SOLN
20.0000 mL | Freq: Once | INTRAVENOUS | Status: AC | PRN
  Administered 2018-02-03: 20 mL via INTRAVENOUS

## 2018-02-03 MED ORDER — POLYETHYLENE GLYCOL 3350 17 G PO PACK
17.0000 g | PACK | Freq: Every day | ORAL | Status: DC
  Administered 2018-02-03 – 2018-02-04 (×2): 17 g via ORAL
  Filled 2018-02-03 (×2): qty 1

## 2018-02-03 NOTE — Progress Notes (Signed)
Vonda Antigua, MD [address], [place], [place], [place], [address] [address], [place], [place], [place], [address] Phone: [phone]  Fax: [phone]   Subjective: No further episodes of GI bleeding.  Abdominal bloating and pain improved.  No nausea or vomiting.  Tolerating oral diet   Objective: Exam: Vital signs in last 24 hours: Vitals:   02/03/18 0456 02/03/18 0753 02/03/18 0756 02/03/18 0758  BP: (!) 136/54 (!) 144/47 (!) 138/123 (!) 119/55  Pulse: 75 67 75 86  Resp: 17 20    Temp: 98.5 F (36.9 C) 99.1 F (37.3 C)    TempSrc: Oral Oral    SpO2: 96% 96%    Weight: 96.9 kg     Height:       Weight change: -0.907 kg  Intake/Output Summary (Last 24 hours) at 02/03/2018 1019 Last data filed at 02/03/2018 0744 Gross per 24 hour  Intake 960 ml  Output 2150 ml  Net -1190 ml    General: No acute distress, AAO x3 Abd: Soft, NT/ND, No HSM Skin: Warm, no rashes Neck: Supple, Trachea midline   Lab Results: Lab Results  Component Value Date   WBC 8.9 02/03/2018   HGB 9.4 (L) 02/03/2018   HCT 28.1 (L) 02/03/2018   MCV 91.4 02/03/2018   PLT 217 02/03/2018   Micro Results: No results found for this or any previous visit (from the past 240 hour(s)). Studies/Results: US Carotid Bilateral  Result Date: 02/02/2018 CLINICAL DATA:  Cerebral infarction. History of myocardial infarction. EXAM: BILATERAL CAROTID DUPLEX ULTRASOUND TECHNIQUE: Pearline Cables scale imaging, color Doppler and duplex ultrasound were performed of bilateral carotid and vertebral arteries in the neck. COMPARISON:  None. FINDINGS: Criteria: Quantification of carotid stenosis is based on velocity parameters that correlate the residual internal carotid diameter with NASCET-based stenosis levels, using the diameter of the distal internal carotid lumen as the denominator for stenosis measurement. The following velocity measurements were obtained: RIGHT ICA:  107/22 cm/sec CCA:  665/99 cm/sec SYSTOLIC  ICA/CCA RATIO:  1.1 ECA:  77 cm/sec LEFT ICA:  103/27 cm/sec CCA:  357/01 cm/sec SYSTOLIC ICA/CCA RATIO:  1.0 ECA:  71 cm/sec RIGHT CAROTID ARTERY: Note is made of a high right-sided carotid bifurcation. There is no grayscale evidence significant intimal thickening or atherosclerotic plaque affecting the interrogated portions of the right carotid system. Borderline elevated peak systolic velocity within distal aspect the right internal carotid artery is felt to be factitiously elevated due to sampling at a location of turbulent flow. RIGHT VERTEBRAL ARTERY:  Antegrade Flow LEFT CAROTID ARTERY: Note is made of a high left-sided carotid bifurcation. There is no grayscale evidence of significant intimal thickening or atherosclerotic plaque affecting the interrogated portions of the left carotid system. Borderline elevated peak systolic velocities within the mid and distal aspects of the left internal carotid artery are felt to be factitiously elevated due to sampling at locations of turbulent flow. LEFT VERTEBRAL ARTERY:  Antegrade Flow IMPRESSION: Unremarkable carotid Doppler ultrasound for age. Electronically Signed   By: Sandi Mariscal M.D.   On: 02/02/2018 14:18   Dg Abd Portable 1v  Result Date: 02/02/2018 CLINICAL DATA:  Abdominal discomfort. History of renal insufficiency, colonic malignancy with surgery yesterday. EXAM: PORTABLE ABDOMEN - 1 VIEW COMPARISON:  Abdominal and pelvic CT scan of January 30, 2018 FINDINGS: There is a moderate amount of gas within normal caliber as well as minimally distended loops of small bowel. The gas and stool pattern within the colon and rectum is normal. No  free extraluminal gas collections are observed. There is an inferior vena caval filter present to the right of the L4 and L5 vertebral bodies. IMPRESSION: No evidence of bowel obstruction nor definite ileus. Electronically Signed   By: David  Martinique M.D.   On: 02/02/2018 12:25   Medications:  Scheduled Meds: . sodium  chloride   Intravenous Once  . fentaNYL  12.5 mcg Transdermal Q72H  . magnesium oxide  400 mg Oral BID  . polyethylene glycol  17 g Oral Daily  . sodium chloride flush  3 mL Intravenous Q12H   Continuous Infusions: PRN Meds:.acetaminophen **OR** acetaminophen, albuterol, bisacodyl, HYDROcodone-acetaminophen, morphine injection, ondansetron **OR** ondansetron (ZOFRAN) IV, senna-docusate   Assessment: Active Problems:   Atypical chest pain   Diverticulosis of large intestine without diverticulitis   Acute gastrointestinal hemorrhage   Hematochezia   Duodenal nodule    Plan: GI bleeding from diverticulosis resolved at this time Continue high-fiber diet MiraLAX once or twice daily to maintain 1-2 soft bowel movements a day Avoid NSAIDs in this elderly patient Follow-up in GI clinic on discharge As recommended in previous notes, consider surgery consult/referral due to history of recurrent diverticular bleeding GI service will sign off, please page with any questions or concerns   LOS: 4 days   Vonda Antigua, MD 02/03/2018, 10:19 AM

## 2018-02-03 NOTE — Progress Notes (Signed)
Orthostatic vitals attempted this morning. Lying BP 144/47 heart rate 67. Sitting BP 138/123 and heart rate 75, attempting to stand BP was 119/55 heart rate 86 but patient had to sit back down in the middle of BP check as she stated that she felt like she was going to pass out. Also when moving from lying to sitting patient c/o severe abdominal pain in the LLQ.

## 2018-02-03 NOTE — Clinical Social Work Note (Signed)
CSW met with patient to discuss SNF recommendation from PT.  CSW spoke to patient's daughter [name], [phone] and [phone] and she does not want patient to go to SNF for short term rehab.  Per patient's daughter, they have caregivers at patient's home 7 days a week for a couple hours a day, and patient lives with her daughter and husband.  Patient's daughter was explained the pros and cons of going to SNF verse going home with home health.  Patient's daughter [name] still felt like she would rather have patient go home with home health.  CSW updated case manager CSW to sign off please reconsult if social work needs arise.  Jones Broom. [place], MSW, [place]  02/03/2018 11:04 AM

## 2018-02-03 NOTE — Care Management (Signed)
TC to daughter, [name], left message at [phone] for return call. Mobile number states it is out of service. Another daughter is in the room. She states patient lives with daughter, [name] and receives home health now but she does not know who with. [name] is at a funeral at this time. Will follow up.

## 2018-02-03 NOTE — Clinical Social Work Note (Signed)
CSW received referral for SNF.  Case discussed with case manager and plan is to discharge home with home health.  CSW to sign off please re-consult if social work needs arise.  Romona Murdy R. Jasnoor Trussell, MSW, LCSWA [phone]  

## 2018-02-03 NOTE — Consult Note (Signed)
Reason for Consult:Syncope Referring Physician: Mody  CC: Syncope  HPI: Jill Bowers is an 84 y.o. female with history of colon cancer who presents with syncope.  Patient noted to be syncopal/presyncopal when sitting or standing.  Found blood in stool prior to admission.  There has been a drop in H/H and patient is orthostatic.   Patient also reports she is incontinent of bowel.  Past Medical History:  Diagnosis Date  . CHF (congestive heart failure) ([place])   . Colon cancer ([place])   . Coronary artery disease   . DVT (deep venous thrombosis) ([place])   . GERD (gastroesophageal reflux disease)   . Hypertension   . Malignant neoplasm of stomach ([place])   . PE (pulmonary embolism)   . Renal disorder   . Renal insufficiency     Past Surgical History:  Procedure Laterality Date  . ABDOMINAL HYSTERECTOMY    . COLON SURGERY    . COLONOSCOPY N/A 02/01/2018   Procedure: COLONOSCOPY;  Surgeon: Virgel Manifold, MD;  Location: [facility] ENDOSCOPY;  Service: Endoscopy;  Laterality: N/A;  . COLONOSCOPY WITH PROPOFOL N/A 08/23/2016   Procedure: COLONOSCOPY WITH PROPOFOL;  Surgeon: Jonathon Bellows, MD;  Location: [facility] ENDOSCOPY;  Service: Gastroenterology;  Laterality: N/A;  . COLONOSCOPY WITH PROPOFOL N/A 08/24/2016   Procedure: COLONOSCOPY WITH PROPOFOL;  Surgeon: Jonathon Bellows, MD;  Location: [facility] ENDOSCOPY;  Service: Endoscopy;  Laterality: N/A;  . ESOPHAGOGASTRODUODENOSCOPY N/A 02/01/2018   Procedure: ESOPHAGOGASTRODUODENOSCOPY (EGD);  Surgeon: Virgel Manifold, MD;  Location: [facility] ENDOSCOPY;  Service: Endoscopy;  Laterality: N/A;  . IVC FILTER PLACEMENT ([place] HX)    . JOINT REPLACEMENT Right    knee    Family History  Problem Relation Age of Onset  . Arthritis Mother   . Hypertension Mother   . Colon cancer Sister   . Heart disease Daughter   . Colon cancer Son   . Pancreatic cancer Son     Social History:  reports that she has never smoked. She has never used smokeless tobacco. She reports that  she does not drink alcohol or use drugs.  Allergies  Allergen Reactions  . Gabapentin Other (See Comments)    Nightmares   . Reglan [Metoclopramide] Other (See Comments)  . Statins Other (See Comments)  . Torsemide Other (See Comments)    Medications:  I have reviewed the patient's current medications. Prior to Admission:  Medications Prior to Admission  Medication Sig Dispense Refill Last Dose  . Cholecalciferol (VITAMIN D-3) 1000 UNITS CAPS Take 2 capsules by mouth daily.   unknown at unknown  . fentaNYL (DURAGESIC - DOSED MCG/HR) 12 MCG/HR Place 1 patch (12.5 mcg total) onto the skin every 3 (three) days. 10 patch 0 unknown at unknown  . furosemide (LASIX) 40 MG tablet Take 1 tablet (40 mg) by mouth once daily 30 tablet 3 unknwon at unknown  . Magnesium Oxide 500 MG TABS Take 1 tablet by mouth 2 (two) times daily.   unknown at unknown  . MEGARED OMEGA-3 KRILL OIL 500 MG CAPS Take by mouth.   unknown at unknown  . omeprazole (PRILOSEC) 20 MG capsule Take 20 mg by mouth daily.   unknwoin at unknown  . Polyethylene Glycol 3350 (MIRALAX PO) Take by mouth daily.   unknown at unknown  . spironolactone (ALDACTONE) 25 MG tablet TAKE 1 TABLET (25 MG TOTAL) BY MOUTH 2 (TWO) TIMES DAILY AS NEEDED. 180 tablet 0 unknown at unknown  . vitamin C (ASCORBIC ACID) 500 MG tablet Take 500  mg by mouth daily.   unknown at unknown   Scheduled: . fentaNYL  12.5 mcg Transdermal Q72H  . magnesium oxide  400 mg Oral BID  . polyethylene glycol  17 g Oral Daily  . sodium chloride flush  3 mL Intravenous Q12H    ROS: History obtained from the patient  General ROS: negative for - chills, fatigue, fever, night sweats, weight gain or weight loss Psychological ROS: negative for - behavioral disorder, hallucinations, memory difficulties, mood swings or suicidal ideation Ophthalmic ROS: negative for - blurry vision, double vision, eye pain or loss of vision ENT ROS: negative for - epistaxis, nasal discharge,  oral lesions, sore throat, tinnitus or vertigo Allergy and Immunology ROS: negative for - hives or itchy/watery eyes Hematological and Lymphatic ROS: negative for - bleeding problems, bruising or swollen lymph nodes Endocrine ROS: negative for - galactorrhea, hair pattern changes, polydipsia/polyuria or temperature intolerance Respiratory ROS: negative for - cough, hemoptysis, shortness of breath or wheezing Cardiovascular ROS: negative for - chest pain, dyspnea on exertion, edema or irregular heartbeat Gastrointestinal ROS: negative for - abdominal pain, diarrhea, hematemesis, nausea/vomiting or stool incontinence Genito-Urinary ROS: negative for - dysuria, hematuria, incontinence or urinary frequency/urgency Musculoskeletal ROS: leg pain Neurological ROS: as noted in HPI Dermatological ROS: negative for rash and skin lesion changes  Physical Examination: Blood pressure (!) 119/54, pulse 72, temperature 98.7 F (37.1 C), temperature source Oral, resp. rate 16, height 5\' 3"  (1.6 m), weight 96.9 kg, SpO2 99 %.  HEENT-  Normocephalic, no lesions, without obvious abnormality.  Normal external eye and conjunctiva.  Normal TM's bilaterally.  Normal auditory canals and external ears. Normal external nose, mucus membranes and septum.  Normal pharynx. Cardiovascular- S1, S2 normal, pulses palpable throughout   Lungs- chest clear, no wheezing, rales, normal symmetric air entry Abdomen- soft, non-tender; bowel sounds normal; no masses,  no organomegaly Extremities- minimal edema Lymph-no adenopathy palpable Musculoskeletal-extensive pain on mild palpation of shins bilaterally Skin-warm and dry, no hyperpigmentation, vitiligo, or suspicious lesions  Neurological Examination   Mental Status: Alert, oriented, thought content appropriate.  Speech fluent without evidence of aphasia.  Able to follow 3 step commands without difficulty. Cranial Nerves: II: Discs flat bilaterally; Visual fields grossly  normal, pupils equal, round, reactive to light and accommodation III,IV, VI: ptosis not present, extra-ocular motions intact bilaterally V,VII: smile symmetric, facial light touch sensation normal bilaterally VIII: hearing normal bilaterally IX,X: gag reflex present XI: bilateral shoulder shrug XII: midline tongue extension Motor: Right : Upper extremity   5/5    Left:     Upper extremity   5/5  Lower extremity   3/5     Lower extremity   3/5 Tone and bulk:normal tone throughout; no atrophy noted Sensory: Pinprick and light touch intact throughout, with pain on the shins bilaterally Deep Tendon Reflexes: 2+ in the upper extremities, 1+ at the knees and absent at the ankles Plantars: Right: downgoing   Left: downgoing Cerebellar: Normal finger-to-nose testing bilaterally Gait: not tested due to safety concerns    Laboratory Studies:   Basic Metabolic Panel: Recent Labs  Lab 01/30/18 1834 01/31/18 0523 02/01/18 0243 02/03/18 0451  NA 142 145 140 139  K 5.3* 5.9* 5.1 4.9  CL 104 112* 108 103  CO2 32 26 29 32  GLUCOSE 132* 120* 112* 119*  BUN 22 22 23 21   CREATININE 1.19* 1.09* 0.99 1.09*  CALCIUM 8.8* 8.7* 8.4* 8.7*  MG  --  1.9  --   --  PHOS  --  3.2  --   --     Liver Function Tests: Recent Labs  Lab 01/30/18 1834 01/31/18 0523  AST 15 16  ALT 7 6  ALKPHOS 58 54  BILITOT 0.5 0.5  PROT 6.4* 5.6*  ALBUMIN 3.5 3.1*   No results for input(s): LIPASE, AMYLASE in the last 168 hours. No results for input(s): AMMONIA in the last 168 hours.  CBC: Recent Labs  Lab 01/30/18 1834 01/30/18 2352 01/31/18 0604 02/01/18 0243 02/02/18 0540 02/03/18 0451  WBC 6.7  --   --  9.0 8.5 8.9  HGB 12.0 11.0* 11.0* 10.5* 9.8* 9.4*  HCT 35.3  --   --  30.3* 29.2* 28.1*  MCV 90.9  --   --  90.0 90.9 91.4  PLT 266  --   --  212 222 217    Cardiac Enzymes: Recent Labs  Lab 01/31/18 0523 01/31/18 1506 01/31/18 2115 02/01/18 0243  TROPONINI <0.03 <0.03 <0.03 <0.03     BNP: Invalid input(s): POCBNP  CBG: Recent Labs  Lab 02/02/18 0803 02/02/18 1041 02/02/18 1654 02/02/18 1928 02/03/18 0036  GLUCAP 87 129* 117* 118* 115*    Microbiology: Results for orders placed or performed during the hospital encounter of 07/02/16  Rapid Influenza A&B Antigens ([place] only)     Status: None   Collection Time: 07/02/16 10:31 AM  Result Value Ref Range Status   Influenza A ([facility]) NEGATIVE NEGATIVE Final   Influenza B ([facility]) NEGATIVE NEGATIVE Final    Coagulation Studies: No results for input(s): LABPROT, INR in the last 72 hours.  Urinalysis: No results for input(s): COLORURINE, LABSPEC, PHURINE, GLUCOSEU, HGBUR, BILIRUBINUR, KETONESUR, PROTEINUR, UROBILINOGEN, NITRITE, LEUKOCYTESUR in the last 168 hours.  Invalid input(s): APPERANCEUR  Lipid Panel:     Component Value Date/Time   CHOL 172 06/12/2015 1125   TRIG 155.0 (H) 06/12/2015 1125   HDL 52.60 06/12/2015 1125   CHOLHDL 3 06/12/2015 1125   VLDL 31.0 06/12/2015 1125   LDLCALC 89 06/12/2015 1125    HgbA1C:  Lab Results  Component Value Date   HGBA1C 5.8 06/12/2015    Urine Drug Screen:  No results found for: LABOPIA, COCAINSCRNUR, LABBENZ, AMPHETMU, THCU, LABBARB  Alcohol Level: No results for input(s): ETH in the last 168 hours.  Other results: EKG: sinus rhythm at 68 bpm, premature atrial complexes.  Imaging: US Carotid Bilateral  Result Date: 02/02/2018 CLINICAL DATA:  Cerebral infarction. History of myocardial infarction. EXAM: BILATERAL CAROTID DUPLEX ULTRASOUND TECHNIQUE: Pearline Cables scale imaging, color Doppler and duplex ultrasound were performed of bilateral carotid and vertebral arteries in the neck. COMPARISON:  None. FINDINGS: Criteria: Quantification of carotid stenosis is based on velocity parameters that correlate the residual internal carotid diameter with NASCET-based stenosis levels, using the diameter of the distal internal carotid lumen as the denominator for stenosis  measurement. The following velocity measurements were obtained: RIGHT ICA:  107/22 cm/sec CCA:  016/01 cm/sec SYSTOLIC ICA/CCA RATIO:  1.1 ECA:  77 cm/sec LEFT ICA:  103/27 cm/sec CCA:  093/23 cm/sec SYSTOLIC ICA/CCA RATIO:  1.0 ECA:  71 cm/sec RIGHT CAROTID ARTERY: Note is made of a high right-sided carotid bifurcation. There is no grayscale evidence significant intimal thickening or atherosclerotic plaque affecting the interrogated portions of the right carotid system. Borderline elevated peak systolic velocity within distal aspect the right internal carotid artery is felt to be factitiously elevated due to sampling at a location of turbulent flow. RIGHT VERTEBRAL ARTERY:  Antegrade Flow LEFT CAROTID ARTERY:  Note is made of a high left-sided carotid bifurcation. There is no grayscale evidence of significant intimal thickening or atherosclerotic plaque affecting the interrogated portions of the left carotid system. Borderline elevated peak systolic velocities within the mid and distal aspects of the left internal carotid artery are felt to be factitiously elevated due to sampling at locations of turbulent flow. LEFT VERTEBRAL ARTERY:  Antegrade Flow IMPRESSION: Unremarkable carotid Doppler ultrasound for age. Electronically Signed   By: Sandi Mariscal M.D.   On: 02/02/2018 14:18   Dg Abd Portable 1v  Result Date: 02/02/2018 CLINICAL DATA:  Abdominal discomfort. History of renal insufficiency, colonic malignancy with surgery yesterday. EXAM: PORTABLE ABDOMEN - 1 VIEW COMPARISON:  Abdominal and pelvic CT scan of January 30, 2018 FINDINGS: There is a moderate amount of gas within normal caliber as well as minimally distended loops of small bowel. The gas and stool pattern within the colon and rectum is normal. No free extraluminal gas collections are observed. There is an inferior vena caval filter present to the right of the L4 and L5 vertebral bodies. IMPRESSION: No evidence of bowel obstruction nor definite ileus.  Electronically Signed   By: David  Martinique M.D.   On: 02/02/2018 12:25     Assessment/Plan: 84 year old female presenting with syncope.  Patient with evidence of active blood loss and orthostatic.  Likely causes for syncope.  EEG unremarkable.  Patient also with lower extremity weakness and fecal incontinence.  Would rule out lumbar etiology.  Recommendations: 1.  MRI of the lumbar spine with and without contrast 2.  Agree with addressing orthostasis and anemia   Alexis Goodell, MD Neurology [phone] 02/03/2018, 11:29 AM

## 2018-02-03 NOTE — Progress Notes (Signed)
Physical Therapy Evaluation Patient Details Name: Jill Bowers MRN: [id number] DOB: August 26, 1928 Today's Date: 02/03/2018   History of Present Illness  Jill Bowers  is a 84 y.o. female with a known history of hypertension, CHF, CAD, colon cancer, DVT, PE and renal insufficiency. The patient was sent to ED due to rectal bleeding. She also complains of abdominal discomfort and pain just below her naval. She has had 4 episodes of a large rectal bleeding the ED.  CT of the abdomen show severe diverticulosis. PT evaluation has been attempted during admission but limited due to syncopal events.  Clinical Impression  Pt admitted with above diagnosis. Pt currently with functional limitations due to the deficits listed below (see PT Problem List). Pt has attempted to work with PT on prior attempts but has had syncopal/presyncopal episodes with vertigo. During evaluation today pt requires maxA+1 for all bed mobility and modA+2 to come to standing. Once upright she reports feeling mildly woozy but is able to remain standing and perform marches. Pt fatigues and sits down at edge of bed. After 2 minutes pt stands again and once she starts marching she has a brief syncopal episode of 2-3 seconds. Pt has no recollection of the event when she arouses. Pt layed back in bed and RN notified. Pt returned to her baseline status very quickly after event. Unable/unsafe to attempt ambulation at this time. Pt with full ocular ROM but poor command follow. She is a very poor historian and it is difficult to get accurate information from her. Daughter reports a history of what sounds like BPPV. Pt taken through Dix-Hallpike testing and appears to possibly have a positive test on the right side. This is likely an incidental finding as it does not explain her syncopal events. Nevertheless pt taken through two bouts of canalith repositioning treatment. At this time pt is unsafe to return home. Pt will benefit from PT services to address  deficits in strength, balance, and mobility in order to return to full function at home.     Follow Up Recommendations SNF    Equipment Recommendations  None recommended by PT    Recommendations for Other Services       Precautions / Restrictions Precautions Precautions: Fall Restrictions Weight Bearing Restrictions: No      Mobility  Bed Mobility Overal bed mobility: Needs Assistance Bed Mobility: Supine to Sit     Supine to sit: Max assist;HOB elevated     General bed mobility comments: Pt requires heavy cues and sequencing to sit up at EOB. Once upright she reports feeling mildly "woozy." She is able to remain sitting upright for extended time. Initially requires minA+1 support but able to progress to supervision only for balance  Transfers Overall transfer level: Needs assistance Equipment used: Rolling walker (2 wheeled) Transfers: Sit to/from Stand Sit to Stand: Mod assist;+2 physical assistance         General transfer comment: Pt requires modA+2 to come to standing. Once upright she reports feeling mildly woozy but is able to remain standing and perform marches. Pt fatigues and sits down at edge of bed. After 2 minutes pt stands again and once she starts marching she has a brief syncopal episode of 2-3 seconds. Pt has no recollection of the event when she arouses. Pt layed back in bed and RN notified. Pt returned to her baseline status very quickly after event. Unable/unsafe to attempt ambulation at this time  Ambulation/Gait  Stairs            Wheelchair Mobility    Modified Rankin (Stroke Patients Only)       Balance Overall balance assessment: Needs assistance Sitting-balance support: No upper extremity supported Sitting balance-Leahy Scale: Good     Standing balance support: Bilateral upper extremity supported Standing balance-Leahy Scale: Fair                               Pertinent Vitals/Pain Pain  Assessment: 0-10 Pain Score: 8  Pain Location: Lower abdominal pain Pain Intervention(s): Monitored during session    Home Living Family/patient expects to be discharged to:: Private residence Living Arrangements: Children Available Help at Discharge: Family Type of Home: House Home Access: Other (comment)(Chair lift)     Home Layout: One level Home Equipment: Walker - 4 wheels;Bedside commode      Prior Function Level of Independence: Needs assistance   Gait / Transfers Assistance Needed: Pt ambulates limited distances with a rollator. Per daughter she is very sedentary at baseline and can only walk approximately 42' at a time  ADL's / Homemaking Assistance Needed: Requires assist with all ADLs/IADLs        Hand Dominance   Dominant Hand: Right    Extremity/Trunk Assessment   Upper Extremity Assessment Upper Extremity Assessment: Generalized weakness    Lower Extremity Assessment Lower Extremity Assessment: Generalized weakness       Communication   Communication: No difficulties  Cognition Arousal/Alertness: Awake/alert Behavior During Therapy: WFL for tasks assessed/performed Overall Cognitive Status: Within Functional Limits for tasks assessed                                        General Comments      Exercises     Assessment/Plan    PT Assessment Patient needs continued PT services  PT Problem List Decreased strength;Decreased balance       PT Treatment Interventions DME instruction;Gait training;Functional mobility training;Therapeutic activities;Therapeutic exercise;Balance training;Neuromuscular re-education    PT Goals (Current goals can be found in the Care Plan section)  Acute Rehab PT Goals Patient Stated Goal: Return to prior level of function PT Goal Formulation: With patient/family Time For Goal Achievement: 02/17/18 Potential to Achieve Goals: Fair    Frequency Min 2X/week   Barriers to discharge Decreased  caregiver support Pt unsafe to attempt transfers with daughter due to syncope    Co-evaluation               AM-PAC PT "6 Clicks" Daily Activity  Outcome Measure Difficulty turning over in bed (including adjusting bedclothes, sheets and blankets)?: Unable Difficulty moving from lying on back to sitting on the side of the bed? : Unable Difficulty sitting down on and standing up from a chair with arms (e.g., wheelchair, bedside commode, etc,.)?: Unable Help needed moving to and from a bed to chair (including a wheelchair)?: A Lot Help needed walking in hospital room?: Total Help needed climbing 3-5 steps with a railing? : Total 6 Click Score: 7    End of Session Equipment Utilized During Treatment: Gait belt Activity Tolerance: Treatment limited secondary to medical complications (Comment) Patient left: in bed;with call bell/phone within reach;with bed alarm set;with family/visitor present Nurse Communication: Mobility status PT Visit Diagnosis: Unsteadiness on feet (R26.81);Muscle weakness (generalized) (M62.81)    Time: 3254-9826 PT Time  Calculation (min) (ACUTE ONLY): 45 min   Charges:   PT Evaluation $PT Eval Low Complexity: 1 Low PT Treatments $Therapeutic Activity: 8-22 mins        Lyndel Safe Huprich PT, DPT, GCS   Huprich,Jason 02/03/2018, 9:57 AM

## 2018-02-03 NOTE — Discharge Summary (Signed)
[facility] NAME: Jill Bowers    MR#:  [id number]  DATE OF BIRTH:  03-13-1929  DATE OF ADMISSION:  01/30/2018 ADMITTING PHYSICIAN: Demetrios Loll, MD  DATE OF DISCHARGE: 02/03/2018  PRIMARY CARE PHYSICIAN: Jearld Fenton, NP    ADMISSION DIAGNOSIS:  Diverticulosis [K57.90] Rectal bleeding [K62.5]  DISCHARGE DIAGNOSIS:  Active Problems:   Atypical chest pain   Diverticulosis of large intestine without diverticulitis   Acute gastrointestinal hemorrhage   Hematochezia   Duodenal nodule   SECONDARY DIAGNOSIS:   Past Medical History:  Diagnosis Date  . CHF (congestive heart failure) ([place])   . Colon cancer ([place])   . Coronary artery disease   . DVT (deep venous thrombosis) ([place])   . GERD (gastroesophageal reflux disease)   . Hypertension   . Malignant neoplasm of stomach ([place])   . PE (pulmonary embolism)   . Renal disorder   . Renal insufficiency     HOSPITAL COURSE:  84 year old female with a history of colon cancer and prior GI bleed who presented to the ER due to GI rectal bleeding  1.  Bright red blood per rectum, which is diverticular in nature: Patient is status post 2 units PRBC  EGD and colonoscopy were performed.    2.  Presyncope:  She has been evaluated cardiology prior to her GI procedures.  Echocardiogram was limited with no wall motion abnormalities and normal ejection fraction.   Telemetry shows no acute changes.   EEG was normal.  CT head was negative for acute changes.   Carotid Doppler also was essentially unremarkable.  I suspect presyncopal is due to mild dehydration.   She was given instructions for syncope/presyncope orthostasis.  She will wear compression hose.    3  Hyperkalemia: This was treated   4.  Chronic diastolic heart failure without signs of exacerbation   DISCHARGE CONDITIONS AND DIET:   Stable for discharge on heart healthy diet  CONSULTS OBTAINED:  Treatment Team:  Virgel Manifold, MD Alexis Goodell, MD  DRUG ALLERGIES:   Allergies  Allergen Reactions  . Gabapentin Other (See Comments)    Nightmares   . Reglan [Metoclopramide] Other (See Comments)  . Statins Other (See Comments)  . Torsemide Other (See Comments)    DISCHARGE MEDICATIONS:   Allergies as of 02/03/2018      Reactions   Gabapentin Other (See Comments)   Nightmares   Reglan [metoclopramide] Other (See Comments)   Statins Other (See Comments)   Torsemide Other (See Comments)      Medication List    TAKE these medications   fentaNYL 12 MCG/HR Commonly known as:  DURAGESIC - dosed mcg/hr Place 1 patch (12.5 mcg total) onto the skin every 3 (three) days.   furosemide 40 MG tablet Commonly known as:  LASIX Take 1 tablet (40 mg) by mouth once daily   Magnesium Oxide 500 MG Tabs Take 1 tablet by mouth 2 (two) times daily.   MEGARED OMEGA-3 KRILL OIL 500 MG Caps Take by mouth.   MIRALAX PO Take by mouth daily.   omeprazole 20 MG capsule Commonly known as:  PRILOSEC Take 20 mg by mouth daily.   spironolactone 25 MG tablet Commonly known as:  ALDACTONE TAKE 1 TABLET (25 MG TOTAL) BY MOUTH 2 (TWO) TIMES DAILY AS NEEDED.   vitamin C 500 MG tablet Commonly known as:  ASCORBIC ACID Take 500 mg by mouth daily.   Vitamin D-3 1000 units  Caps Take 2 capsules by mouth daily.         Today   CHIEF COMPLAINT:  No acute issues overnight   VITAL SIGNS:  Blood pressure (!) 119/55, pulse 86, temperature 99.1 F (37.3 C), temperature source Oral, resp. rate 20, height 5\' 3"  (1.6 m), weight 96.9 kg, SpO2 96 %.   REVIEW OF SYSTEMS:  Review of Systems  Constitutional: Positive for malaise/fatigue. Negative for chills and fever.  HENT: Negative.  Negative for ear discharge, ear pain, hearing loss, nosebleeds and sore throat.   Eyes: Negative.  Negative for blurred vision and pain.  Respiratory: Negative.  Negative for cough, hemoptysis, shortness of breath and  wheezing.   Cardiovascular: Negative.  Negative for chest pain, palpitations and leg swelling.  Gastrointestinal: Negative.  Negative for abdominal pain, blood in stool, diarrhea, nausea and vomiting.  Genitourinary: Negative.  Negative for dysuria.  Musculoskeletal: Negative.  Negative for back pain.  Skin: Negative.   Neurological: Negative for dizziness, tremors, speech change, focal weakness, seizures and headaches.  Endo/Heme/Allergies: Negative.  Does not bruise/bleed easily.  Psychiatric/Behavioral: Negative.  Negative for depression, hallucinations and suicidal ideas.     PHYSICAL EXAMINATION:  GENERAL:  84 y.o.-year-old patient lying in the bed with no acute distress.  NECK:  Supple, no jugular venous distention. No thyroid enlargement, no tenderness.  LUNGS: Normal breath sounds bilaterally, no wheezing, rales,rhonchi  No use of accessory muscles of respiration.  CARDIOVASCULAR: S1, S2 normal. No murmurs, rubs, or gallops.  ABDOMEN: Soft, non-tender, non-distended. Bowel sounds present. No organomegaly or mass.  EXTREMITIES: No pedal edema, cyanosis, or clubbing.  PSYCHIATRIC: The patient is alert and oriented x 3.  SKIN: No obvious rash, lesion, or ulcer.   DATA REVIEW:   CBC Recent Labs  Lab 02/03/18 0451  WBC 8.9  HGB 9.4*  HCT 28.1*  PLT 217    Chemistries  Recent Labs  Lab 01/31/18 0523  02/03/18 0451  NA 145   < > 139  K 5.9*   < > 4.9  CL 112*   < > 103  CO2 26   < > 32  GLUCOSE 120*   < > 119*  BUN 22   < > 21  CREATININE 1.09*   < > 1.09*  CALCIUM 8.7*   < > 8.7*  MG 1.9  --   --   AST 16  --   --   ALT 6  --   --   ALKPHOS 54  --   --   BILITOT 0.5  --   --    < > = values in this interval not displayed.    Cardiac Enzymes Recent Labs  Lab 01/31/18 1506 01/31/18 2115 02/01/18 0243  TROPONINI <0.03 <0.03 <0.03    Microbiology Results  @[email or web address] @  RADIOLOGY:  US Carotid Bilateral  Result Date: 02/02/2018 CLINICAL DATA:   Cerebral infarction. History of myocardial infarction. EXAM: BILATERAL CAROTID DUPLEX ULTRASOUND TECHNIQUE: Pearline Cables scale imaging, color Doppler and duplex ultrasound were performed of bilateral carotid and vertebral arteries in the neck. COMPARISON:  None. FINDINGS: Criteria: Quantification of carotid stenosis is based on velocity parameters that correlate the residual internal carotid diameter with NASCET-based stenosis levels, using the diameter of the distal internal carotid lumen as the denominator for stenosis measurement. The following velocity measurements were obtained: RIGHT ICA:  107/22 cm/sec CCA:  505/39 cm/sec SYSTOLIC ICA/CCA RATIO:  1.1 ECA:  77 cm/sec LEFT ICA:  103/27 cm/sec CCA:  767/34 cm/sec SYSTOLIC  ICA/CCA RATIO:  1.0 ECA:  71 cm/sec RIGHT CAROTID ARTERY: Note is made of a high right-sided carotid bifurcation. There is no grayscale evidence significant intimal thickening or atherosclerotic plaque affecting the interrogated portions of the right carotid system. Borderline elevated peak systolic velocity within distal aspect the right internal carotid artery is felt to be factitiously elevated due to sampling at a location of turbulent flow. RIGHT VERTEBRAL ARTERY:  Antegrade Flow LEFT CAROTID ARTERY: Note is made of a high left-sided carotid bifurcation. There is no grayscale evidence of significant intimal thickening or atherosclerotic plaque affecting the interrogated portions of the left carotid system. Borderline elevated peak systolic velocities within the mid and distal aspects of the left internal carotid artery are felt to be factitiously elevated due to sampling at locations of turbulent flow. LEFT VERTEBRAL ARTERY:  Antegrade Flow IMPRESSION: Unremarkable carotid Doppler ultrasound for age. Electronically Signed   By: Sandi Mariscal M.D.   On: 02/02/2018 14:18   Dg Abd Portable 1v  Result Date: 02/02/2018 CLINICAL DATA:  Abdominal discomfort. History of renal insufficiency, colonic  malignancy with surgery yesterday. EXAM: PORTABLE ABDOMEN - 1 VIEW COMPARISON:  Abdominal and pelvic CT scan of January 30, 2018 FINDINGS: There is a moderate amount of gas within normal caliber as well as minimally distended loops of small bowel. The gas and stool pattern within the colon and rectum is normal. No free extraluminal gas collections are observed. There is an inferior vena caval filter present to the right of the L4 and L5 vertebral bodies. IMPRESSION: No evidence of bowel obstruction nor definite ileus. Electronically Signed   By: David  Martinique M.D.   On: 02/02/2018 12:25      Allergies as of 02/03/2018      Reactions   Gabapentin Other (See Comments)   Nightmares   Reglan [metoclopramide] Other (See Comments)   Statins Other (See Comments)   Torsemide Other (See Comments)      Medication List    TAKE these medications   fentaNYL 12 MCG/HR Commonly known as:  DURAGESIC - dosed mcg/hr Place 1 patch (12.5 mcg total) onto the skin every 3 (three) days.   furosemide 40 MG tablet Commonly known as:  LASIX Take 1 tablet (40 mg) by mouth once daily   Magnesium Oxide 500 MG Tabs Take 1 tablet by mouth 2 (two) times daily.   MEGARED OMEGA-3 KRILL OIL 500 MG Caps Take by mouth.   MIRALAX PO Take by mouth daily.   omeprazole 20 MG capsule Commonly known as:  PRILOSEC Take 20 mg by mouth daily.   spironolactone 25 MG tablet Commonly known as:  ALDACTONE TAKE 1 TABLET (25 MG TOTAL) BY MOUTH 2 (TWO) TIMES DAILY AS NEEDED.   vitamin C 500 MG tablet Commonly known as:  ASCORBIC ACID Take 500 mg by mouth daily.   Vitamin D-3 1000 units Caps Take 2 capsules by mouth daily.          Management plans discussed with the patient and family and she is in agreement. Stable for discharge home with [facility] as per request of family they did not want SNF  Patient should follow up with pcp  CODE STATUS:     Code Status Orders  (From admission, onward)         Start      Ordered   01/31/18 0434  Do not attempt resuscitation (DNR)  Continuous    Question Answer Comment  In the event of cardiac or respiratory ARREST  Do not call a "code blue"   In the event of cardiac or respiratory ARREST Do not perform Intubation, CPR, defibrillation or ACLS   In the event of cardiac or respiratory ARREST Use medication by any route, position, wound care, and other measures to relive pain and suffering. May use oxygen, suction and manual treatment of airway obstruction as needed for comfort.      01/31/18 0433        Code Status History    Date Active Date Inactive Code Status Order ID Comments User Context   01/30/2018 2311 01/31/2018 0433 DNR [id number]  Demetrios Loll, MD ED   08/21/2016 2019 08/25/2016 2047 Full Code [id number]  Gladstone Lighter, MD Inpatient   07/02/2015 2232 07/03/2015 1906 DNR [id number]  Loletha Grayer, MD ED   05/29/2015 1751 05/30/2015 1832 DNR [id number]  Epifanio Lesches, MD ED   05/29/2015 1750 05/29/2015 1751 Full Code [id number]  Epifanio Lesches, MD ED      TOTAL TIME TAKING CARE OF THIS PATIENT: 38 minutes.    Note: This dictation was prepared with Dragon dictation along with smaller phrase technology. Any transcriptional errors that result from this process are unintentional.  Makaila Windle M.D on 02/03/2018 at 11:02 AM  Between 7am to 6pm - Pager - [phone] After 6pm go to [email or web address] - password [email or web address] [facility]  Office  [phone]  CC: Primary care physician; Jearld Fenton, NP

## 2018-02-03 NOTE — Care Management Note (Signed)
Case Management Note  Patient Details  Name: Aeisha Minarik MRN: [id number] Date of Birth: [birth year]  Subjective/Objective:   RNCM received call from [name], patients daughter. Discussed transitions of care to home. Offered a list of home health agencies. [facility] to [facility] and RN. Per the daughter patient has a home health aide with [facility]. Daughter declined the need for a CSW. PCP is [facility].                  Action/Plan: [facility] for RN and PT  Expected Discharge Date:  02/03/18               Expected Discharge Plan:  [place]  In-House Referral:     Discharge planning Services  CM Consult  Post Acute Care Choice:  Home Health Choice offered to:  Adult Children  DME Arranged:    DME Agency:     HH Arranged:  RN, PT [place] Agency:  [facility]  Status of Service:  Completed, signed off  If discussed at [place] of Stay Meetings, dates discussed:    Additional Comments:  Jolly Mango, RN 02/03/2018, 3:55 PM

## 2018-02-04 DIAGNOSIS — I10 Essential (primary) hypertension: Secondary | ICD-10-CM | POA: Diagnosis not present

## 2018-02-04 DIAGNOSIS — K5731 Diverticulosis of large intestine without perforation or abscess with bleeding: Secondary | ICD-10-CM | POA: Diagnosis not present

## 2018-02-04 DIAGNOSIS — I110 Hypertensive heart disease with heart failure: Secondary | ICD-10-CM | POA: Diagnosis not present

## 2018-02-04 DIAGNOSIS — E875 Hyperkalemia: Secondary | ICD-10-CM | POA: Diagnosis not present

## 2018-02-04 DIAGNOSIS — I5032 Chronic diastolic (congestive) heart failure: Secondary | ICD-10-CM | POA: Diagnosis not present

## 2018-02-04 DIAGNOSIS — Z66 Do not resuscitate: Secondary | ICD-10-CM | POA: Diagnosis not present

## 2018-02-04 DIAGNOSIS — K5790 Diverticulosis of intestine, part unspecified, without perforation or abscess without bleeding: Secondary | ICD-10-CM | POA: Diagnosis not present

## 2018-02-04 DIAGNOSIS — K625 Hemorrhage of anus and rectum: Secondary | ICD-10-CM | POA: Diagnosis not present

## 2018-02-04 DIAGNOSIS — C779 Secondary and unspecified malignant neoplasm of lymph node, unspecified: Secondary | ICD-10-CM | POA: Diagnosis not present

## 2018-02-04 LAB — TYPE AND SCREEN
ABO/RH(D): O POS
Antibody Screen: NEGATIVE
Unit division: 0

## 2018-02-04 LAB — BPAM RBC
Blood Product Expiration Date: 201908132359
ISSUE DATE / TIME: 201908091128
UNIT TYPE AND RH: 9500

## 2018-02-04 LAB — CBC
HCT: 29.7 % — ABNORMAL LOW (ref 35.0–47.0)
HEMOGLOBIN: 10 g/dL — AB (ref 12.0–16.0)
MCH: 30 pg (ref 26.0–34.0)
MCHC: 33.7 g/dL (ref 32.0–36.0)
MCV: 88.8 fL (ref 80.0–100.0)
Platelets: 202 10*3/uL (ref 150–440)
RBC: 3.34 MIL/uL — ABNORMAL LOW (ref 3.80–5.20)
RDW: 14.8 % — ABNORMAL HIGH (ref 11.5–14.5)
WBC: 9.8 10*3/uL (ref 3.6–11.0)

## 2018-02-04 NOTE — Care Management Note (Signed)
Case Management Note  Patient Details  Name: Jill Bowers MRN: [id number] Date of Birth: 05/15/1929  Subjective/Objective:   Patient to be discharged per MD order. Orders in place for home health services. Previous RNCM work up in for home health with [facility]. Patient and family in agreement to previously discussed plan. No further needs.  Ines Bloomer RN BSN RNCM [phone]    Action/Plan:   Expected Discharge Date:  02/04/18               Expected Discharge Plan:  [place]  In-House Referral:     Discharge planning Services  CM Consult  Post Acute Care Choice:  Home Health Choice offered to:  Adult Children  DME Arranged:    DME Agency:     HH Arranged:  RN, PT [place] Agency:  [facility]  Status of Service:  Completed, signed off  If discussed at [place] of Stay Meetings, dates discussed:    Additional Comments:  Jill Wachs A Jakaila Norment, RN 02/04/2018, 10:59 AM

## 2018-02-04 NOTE — Progress Notes (Signed)
Discharge instructions explained to pt and pts daughters/ verbalized an understanding/ iv and tele removed/ transported off unit via wheelchair.

## 2018-02-04 NOTE — Discharge Summary (Signed)
[facility] NAME: Jill Bowers    MR#:  [id number]  DATE OF BIRTH:  1929/01/25  DATE OF ADMISSION:  01/30/2018 ADMITTING PHYSICIAN: Demetrios Loll, MD  DATE OF DISCHARGE: No discharge date for patient encounter.  PRIMARY CARE PHYSICIAN: Jearld Fenton, NP    ADMISSION DIAGNOSIS:  Diverticulosis [K57.90] Rectal bleeding [K62.5]  DISCHARGE DIAGNOSIS:  Active Problems:   Atypical chest pain   Diverticulosis of large intestine without diverticulitis   Acute gastrointestinal hemorrhage   Hematochezia   Duodenal nodule   SECONDARY DIAGNOSIS:   Past Medical History:  Diagnosis Date  . CHF (congestive heart failure) ([place])   . Colon cancer ([place])   . Coronary artery disease   . DVT (deep venous thrombosis) ([place])   . GERD (gastroesophageal reflux disease)   . Hypertension   . Malignant neoplasm of stomach ([place])   . PE (pulmonary embolism)   . Renal disorder   . Renal insufficiency     HOSPITAL COURSE:  84 year old female with a history of colon cancer and prior GI bleed who presented to the ER due to GI rectal bleeding  1. Bright red blood per rectum, which isdiverticular in nature: Patient is status post 2 units PRBC  EGD and colonoscopy were performed.  2. Presyncope:  She has been evaluated cardiology prior to her GI procedures. Echocardiogram was limited with no wall motion abnormalities and normal ejection fraction.  Telemetry shows no acute changes.  EEG was normal.  CT head was negative for acute changes.   Carotid Doppler also was essentially unremarkable.  I suspect presyncopal is due to mild dehydration.   She was given instructions for syncope/presyncope orthostasis.  She will wear compression hose.    3 Hyperkalemia: This was treated  4. Chronic diastolic heart failure without signs of exacerbation  5.  Chronic severe spinal stenosis Neurology did see patient while in house, MRI of the  lumbar spine done noted for severe spinal stenosis at L5-S1 Neurosurgery appointment made in 1 week status post discharge for reevaluation  DISCHARGE CONDITIONS:   stable  CONSULTS OBTAINED:  Treatment Team:  Alexis Goodell, MD  DRUG ALLERGIES:   Allergies  Allergen Reactions  . Gabapentin Other (See Comments)    Nightmares   . Reglan [Metoclopramide] Other (See Comments)  . Statins Other (See Comments)  . Torsemide Other (See Comments)    DISCHARGE MEDICATIONS:   Allergies as of 02/04/2018      Reactions   Gabapentin Other (See Comments)   Nightmares   Reglan [metoclopramide] Other (See Comments)   Statins Other (See Comments)   Torsemide Other (See Comments)      Medication List    TAKE these medications   fentaNYL 12 MCG/HR Commonly known as:  DURAGESIC - dosed mcg/hr Place 1 patch (12.5 mcg total) onto the skin every 3 (three) days.   furosemide 40 MG tablet Commonly known as:  LASIX Take 1 tablet (40 mg) by mouth once daily   Magnesium Oxide 500 MG Tabs Take 1 tablet by mouth 2 (two) times daily.   MEGARED OMEGA-3 KRILL OIL 500 MG Caps Take by mouth.   MIRALAX PO Take by mouth daily.   omeprazole 20 MG capsule Commonly known as:  PRILOSEC Take 20 mg by mouth daily.   spironolactone 25 MG tablet Commonly known as:  ALDACTONE TAKE 1 TABLET (25 MG TOTAL) BY MOUTH 2 (TWO) TIMES DAILY AS NEEDED.   vitamin C 500  MG tablet Commonly known as:  ASCORBIC ACID Take 500 mg by mouth daily.   Vitamin D-3 1000 units Caps Take 2 capsules by mouth daily.        DISCHARGE INSTRUCTIONS:   If you experience worsening of your admission symptoms, develop shortness of breath, life threatening emergency, suicidal or homicidal thoughts you must seek medical attention immediately by calling 911 or calling your MD immediately  if symptoms less severe.  You Must read complete instructions/literature along with all the possible adverse reactions/side effects for  all the Medicines you take and that have been prescribed to you. Take any new Medicines after you have completely understood and accept all the possible adverse reactions/side effects.   Please note  You were cared for by a hospitalist during your hospital stay. If you have any questions about your discharge medications or the care you received while you were in the hospital after you are discharged, you can call the unit and asked to speak with the hospitalist on call if the hospitalist that took care of you is not available. Once you are discharged, your primary care physician will handle any further medical issues. Please note that NO REFILLS for any discharge medications will be authorized once you are discharged, as it is imperative that you return to your primary care physician (or establish a relationship with a primary care physician if you do not have one) for your aftercare needs so that they can reassess your need for medications and monitor your lab values.    Today   CHIEF COMPLAINT:   Chief Complaint  Patient presents with  . GI Bleeding    HISTORY OF PRESENT ILLNESS:  84 y.o. female with a known history of hypertension, CHF, CAD, colon cancer, DVT, PE and renal insufficiency.  The patient was sent to ED due to rectal bleeding today. She also complains of abdominal discomfort and pain in the middle part.  She has had 4 episodes of a large rectal bleeding the ED.  CT of the abdomen show severe diverticulosis.   VITAL SIGNS:  Blood pressure (!) 129/56, pulse 71, temperature 98.9 F (37.2 C), temperature source Oral, resp. rate 18, height 5\' 3"  (1.6 m), weight 97.5 kg, SpO2 96 %.  I/O:    Intake/Output Summary (Last 24 hours) at 02/04/2018 1024 Last data filed at 02/04/2018 0600 Gross per 24 hour  Intake 638 ml  Output 500 ml  Net 138 ml    PHYSICAL EXAMINATION:  GENERAL:  84 y.o.-year-old patient lying in the bed with no acute distress.  EYES: Pupils equal, round,  reactive to light and accommodation. No scleral icterus. Extraocular muscles intact.  HEENT: Head atraumatic, normocephalic. Oropharynx and nasopharynx clear.  NECK:  Supple, no jugular venous distention. No thyroid enlargement, no tenderness.  LUNGS: Normal breath sounds bilaterally, no wheezing, rales,rhonchi or crepitation. No use of accessory muscles of respiration.  CARDIOVASCULAR: S1, S2 normal. No murmurs, rubs, or gallops.  ABDOMEN: Soft, non-tender, non-distended. Bowel sounds present. No organomegaly or mass.  EXTREMITIES: No pedal edema, cyanosis, or clubbing.  NEUROLOGIC: Cranial nerves II through XII are intact. Muscle strength 5/5 in all extremities. Sensation intact. Gait not checked.  PSYCHIATRIC: The patient is alert and oriented x 3.  SKIN: No obvious rash, lesion, or ulcer.   DATA REVIEW:   CBC Recent Labs  Lab 02/04/18 0408  WBC 9.8  HGB 10.0*  HCT 29.7*  PLT 202    Chemistries  Recent Labs  Lab 01/31/18 0523  02/03/18 0451  NA 145   < > 139  K 5.9*   < > 4.9  CL 112*   < > 103  CO2 26   < > 32  GLUCOSE 120*   < > 119*  BUN 22   < > 21  CREATININE 1.09*   < > 1.09*  CALCIUM 8.7*   < > 8.7*  MG 1.9  --   --   AST 16  --   --   ALT 6  --   --   ALKPHOS 54  --   --   BILITOT 0.5  --   --    < > = values in this interval not displayed.    Cardiac Enzymes Recent Labs  Lab 02/01/18 0243  TROPONINI <0.03    Microbiology Results  Results for orders placed or performed during the hospital encounter of 07/02/16  Rapid Influenza A&B Antigens ([place] only)     Status: None   Collection Time: 07/02/16 10:31 AM  Result Value Ref Range Status   Influenza A ([facility]) NEGATIVE NEGATIVE Final   Influenza B [facility]) NEGATIVE NEGATIVE Final    RADIOLOGY:  Mr Lumbar Spine W Wo Contrast  Result Date: 02/03/2018 CLINICAL DATA:  Lower extremity weakness and bowel incontinence. EXAM: MRI LUMBAR SPINE WITHOUT AND WITH CONTRAST TECHNIQUE: Multiplanar and multiecho  pulse sequences of the lumbar spine were obtained without and with intravenous contrast. CONTRAST:  55mL MULTIHANCE GADOBENATE DIMEGLUMINE 529 MG/ML IV SOLN COMPARISON:  PET CT 08/03/2017, CT abdomen pelvis 01/30/2018 FINDINGS: Segmentation: Normal. The lowest disc space is considered to be L5-S1. Alignment:  Grade 1 anterolisthesis at L4-L5 and L5-S1. Vertebrae: No acute compression fracture, discitis-osteomyelitis of focal marrow lesion. No abnormal marrow contrast enhancement. Conus medullaris and cauda equina: The conus medullaris terminates at the L2 level. The cauda equina and conus medullaris are both normal. Paraspinal and other soft tissues: Iliac veins are occluded below the level of IVC filter. Atrophy of the paraspinous muscles. Disc levels: The T11-L1 levels are imaged only in the sagittal plane, but are normal without stenosis or disc herniation. L1-L2: Normal. L2-L3: Normal. L3-L4: Normal. L4-L5: Grade 1 anterolisthesis with mild facet hypertrophy. Medium-sized diffuse disc bulge. No spinal canal stenosis. Mild bilateral neural foraminal stenosis. L5-S1: Grade 1 anterolisthesis with medium-sized diffuse disc bulge and severe facet hypertrophy. Severe spinal canal stenosis and severe right, moderate left neural foraminal stenosis. IMPRESSION: 1. Severe spinal canal stenosis at L5-S1 with severe right and moderate left neural foraminal stenosis due to combination of disc bulging and anterolisthesis caused by severe facet arthrosis. 2. Mild bilateral L4-5 neural foraminal stenosis and grade 1 anterolisthesis. 3. No osseous metastatic disease of the lumbar spine. Electronically Signed   By: Ulyses Jarred M.D.   On: 02/03/2018 23:47   US Carotid Bilateral  Result Date: 02/02/2018 CLINICAL DATA:  Cerebral infarction. History of myocardial infarction. EXAM: BILATERAL CAROTID DUPLEX ULTRASOUND TECHNIQUE: Pearline Cables scale imaging, color Doppler and duplex ultrasound were performed of bilateral carotid and  vertebral arteries in the neck. COMPARISON:  None. FINDINGS: Criteria: Quantification of carotid stenosis is based on velocity parameters that correlate the residual internal carotid diameter with NASCET-based stenosis levels, using the diameter of the distal internal carotid lumen as the denominator for stenosis measurement. The following velocity measurements were obtained: RIGHT ICA:  107/22 cm/sec CCA:  175/10 cm/sec SYSTOLIC ICA/CCA RATIO:  1.1 ECA:  77 cm/sec LEFT ICA:  103/27 cm/sec CCA:  258/52 cm/sec SYSTOLIC ICA/CCA RATIO:  1.0 ECA:  71 cm/sec RIGHT CAROTID ARTERY: Note is made of a high right-sided carotid bifurcation. There is no grayscale evidence significant intimal thickening or atherosclerotic plaque affecting the interrogated portions of the right carotid system. Borderline elevated peak systolic velocity within distal aspect the right internal carotid artery is felt to be factitiously elevated due to sampling at a location of turbulent flow. RIGHT VERTEBRAL ARTERY:  Antegrade Flow LEFT CAROTID ARTERY: Note is made of a high left-sided carotid bifurcation. There is no grayscale evidence of significant intimal thickening or atherosclerotic plaque affecting the interrogated portions of the left carotid system. Borderline elevated peak systolic velocities within the mid and distal aspects of the left internal carotid artery are felt to be factitiously elevated due to sampling at locations of turbulent flow. LEFT VERTEBRAL ARTERY:  Antegrade Flow IMPRESSION: Unremarkable carotid Doppler ultrasound for age. Electronically Signed   By: Sandi Mariscal M.D.   On: 02/02/2018 14:18    EKG:   Orders placed or performed during the hospital encounter of 01/30/18  . EKG 12-Lead  . EKG 12-Lead  . EKG 12-Lead  . EKG 12-Lead  . EKG  . EKG 12-Lead  . EKG 12-Lead  . EKG 12-Lead  . EKG 12-Lead  . EKG 12-Lead  . EKG 12-Lead  . EKG 12-Lead  . EKG 12-Lead      Management plans discussed with the  patient, family and they are in agreement.  CODE STATUS:     Code Status Orders  (From admission, onward)         Start     Ordered   01/31/18 0434  Do not attempt resuscitation (DNR)  Continuous    Question Answer Comment  In the event of cardiac or respiratory ARREST Do not call a "code blue"   In the event of cardiac or respiratory ARREST Do not perform Intubation, CPR, defibrillation or ACLS   In the event of cardiac or respiratory ARREST Use medication by any route, position, wound care, and other measures to relive pain and suffering. May use oxygen, suction and manual treatment of airway obstruction as needed for comfort.      01/31/18 0433        Code Status History    Date Active Date Inactive Code Status Order ID Comments User Context   01/30/2018 2311 01/31/2018 0433 DNR [id number]  Demetrios Loll, MD ED   08/21/2016 2019 08/25/2016 2047 Full Code [id number]  Gladstone Lighter, MD Inpatient   07/02/2015 2232 07/03/2015 1906 DNR [id number]  Loletha Grayer, MD ED   05/29/2015 1751 05/30/2015 1832 DNR [id number]  Epifanio Lesches, MD ED   05/29/2015 1750 05/29/2015 1751 Full Code [id number]  Epifanio Lesches, MD ED      TOTAL TIME TAKING CARE OF THIS PATIENT: 45 minutes.    Avel Peace Salary M.D on 02/04/2018 at 10:24 AM  Between 7am to 6pm - Pager - [phone]  After 6pm go to [email or web address] - password [email or web address] [facility]  Office  [phone]  CC: Primary care physician; Jearld Fenton, NP   Note: This dictation was prepared with Dragon dictation along with smaller phrase technology. Any transcriptional errors that result from this process are unintentional.

## 2018-02-07 ENCOUNTER — Encounter: Payer: Self-pay | Admitting: Internal Medicine

## 2018-02-07 ENCOUNTER — Ambulatory Visit (INDEPENDENT_AMBULATORY_CARE_PROVIDER_SITE_OTHER): Payer: Medicare Other | Admitting: Internal Medicine

## 2018-02-07 VITALS — BP 128/74 | HR 80 | Temp 98.5°F

## 2018-02-07 DIAGNOSIS — M48061 Spinal stenosis, lumbar region without neurogenic claudication: Secondary | ICD-10-CM

## 2018-02-07 DIAGNOSIS — G8929 Other chronic pain: Secondary | ICD-10-CM | POA: Diagnosis not present

## 2018-02-07 DIAGNOSIS — R55 Syncope and collapse: Secondary | ICD-10-CM

## 2018-02-07 DIAGNOSIS — R109 Unspecified abdominal pain: Secondary | ICD-10-CM

## 2018-02-07 DIAGNOSIS — K5733 Diverticulitis of large intestine without perforation or abscess with bleeding: Secondary | ICD-10-CM | POA: Diagnosis not present

## 2018-02-07 LAB — CBC
HCT: 33.2 % — ABNORMAL LOW (ref 36.0–46.0)
Hemoglobin: 11.1 g/dL — ABNORMAL LOW (ref 12.0–15.0)
MCHC: 33.3 g/dL (ref 30.0–36.0)
MCV: 89.1 fl (ref 78.0–100.0)
PLATELETS: 293 10*3/uL (ref 150.0–400.0)
RBC: 3.72 Mil/uL — AB (ref 3.87–5.11)
RDW: 14.8 % (ref 11.5–15.5)
WBC: 8.6 10*3/uL (ref 4.0–10.5)

## 2018-02-07 NOTE — Patient Instructions (Signed)
Diverticulosis  Diverticulosis is a condition that develops when small pouches (diverticula) form in the wall of the large intestine (colon). The colon is where water is absorbed and stool is formed. The pouches form when the inside layer of the colon pushes through weak spots in the outer layers of the colon. You may have a few pouches or many of them.  What are the causes?  The cause of this condition is not known.  What increases the risk?  The following factors may make you more likely to develop this condition:   Being older than age 60. Your risk for this condition increases with age. Diverticulosis is rare among people younger than age 30. By age 80, many people have it.   Eating a low-fiber diet.   Having frequent constipation.   Being overweight.   Not getting enough exercise.   Smoking.   Taking over-the-counter pain medicines, like aspirin and ibuprofen.   Having a family history of diverticulosis.    What are the signs or symptoms?  In most people, there are no symptoms of this condition. If you do have symptoms, they may include:   Bloating.   Cramps in the abdomen.   Constipation or diarrhea.   Pain in the lower left side of the abdomen.    How is this diagnosed?  This condition is most often diagnosed during an exam for other colon problems. Because diverticulosis usually has no symptoms, it often cannot be diagnosed independently. This condition may be diagnosed by:   Using a flexible scope to examine the colon (colonoscopy).   Taking an X-ray of the colon after dye has been put into the colon (barium enema).   Doing a CT scan.    How is this treated?  You may not need treatment for this condition if you have never developed an infection related to diverticulosis. If you have had an infection before, treatment may include:   Eating a high-fiber diet. This may include eating more fruits, vegetables, and grains.   Taking a fiber supplement.   Taking a live bacteria supplement  (probiotic).   Taking medicine to relax your colon.   Taking antibiotic medicines.    Follow these instructions at home:   Drink 6-8 glasses of water or more each day to prevent constipation.   Try not to strain when you have a bowel movement.   If you have had an infection before:  ? Eat more fiber as directed by your health care provider or your diet and nutrition specialist (dietitian).  ? Take a fiber supplement or probiotic, if your health care provider approves.   Take over-the-counter and prescription medicines only as told by your health care provider.   If you were prescribed an antibiotic, take it as told by your health care provider. Do not stop taking the antibiotic even if you start to feel better.   Keep all follow-up visits as told by your health care provider. This is important.  Contact a health care provider if:   You have pain in your abdomen.   You have bloating.   You have cramps.   You have not had a bowel movement in 3 days.  Get help right away if:   Your pain gets worse.   Your bloating becomes very bad.   You have a fever or chills, and your symptoms suddenly get worse.   You vomit.   You have bowel movements that are bloody or black.   You have   bleeding from your rectum.  Summary   Diverticulosis is a condition that develops when small pouches (diverticula) form in the wall of the large intestine (colon).   You may have a few pouches or many of them.   This condition is most often diagnosed during an exam for other colon problems.   If you have had an infection related to diverticulosis, treatment may include increasing the fiber in your diet, taking supplements, or taking medicines.  This information is not intended to replace advice given to you by your health care provider. Make sure you discuss any questions you have with your health care provider.  Document Released: 03/11/2004 Document Revised: 05/03/2016 Document Reviewed: 05/03/2016  [employer]vier Interactive  Patient Education  2017 [employer]

## 2018-02-07 NOTE — Progress Notes (Signed)
Subjective:    Patient ID: Jill Bowers, female    DOB: [birth year], 84 y.o.   MRN: [id number]  HPI  Pt presents to the clinic today for [facility] Follow Up. She went to the ER 8/5 with c/o abdominal pain and rectal bleeding. CT scan showed severe diverticular disease. She was admitted, received 2 units PRBC. EGD and colonoscopy were performed and showed diverticular bleed. She was syncopal prior to her scheduled procedure. Echo was normal. ECG was normal. EEG was normal. CT head was negative for acute findings. Carotid doppler was unremarkable. They felt her syncope was related to dehydration, which was treated with fluids. She also had a MRI lumbar spine, which showed severe spinal stenosis. She was set up with neurosurgery as an outpatient. She was discharged on 8/10. Since discharge, they have not noticed any more bleeding, but she is very weak, dizziness and having poor safety awareness. She needs extensive help with ADL's which her daughter is having trouble providing due to her on health issues.  They also want to discuss her chronic abdominal pain. We switched her pain Percocet to Fentanyl patch. The Fentanyl patch was ineffective even at 25 mcg.She switched back to the Percocet every 8 hours but she continues to have breakthrough pain. The daughter would like to know how we can better manage her pain.  Review of Systems  Past Medical History:  Diagnosis Date  . CHF (congestive heart failure) ([place])   . Colon cancer ([place])   . Coronary artery disease   . DVT (deep venous thrombosis) ([place])   . GERD (gastroesophageal reflux disease)   . Hypertension   . Malignant neoplasm of stomach ([place])   . PE (pulmonary embolism)   . Renal disorder   . Renal insufficiency     Current Outpatient Medications  Medication Sig Dispense Refill  . Cholecalciferol (VITAMIN D-3) 1000 UNITS CAPS Take 2 capsules by mouth daily.    . fentaNYL (DURAGESIC - DOSED MCG/HR) 12 MCG/HR Place 1 patch (12.5 mcg  total) onto the skin every 3 (three) days. 10 patch 0  . furosemide (LASIX) 40 MG tablet Take 1 tablet (40 mg) by mouth once daily 30 tablet 3  . Magnesium Oxide 500 MG TABS Take 1 tablet by mouth 2 (two) times daily.    Marland Kitchen MEGARED OMEGA-3 KRILL OIL 500 MG CAPS Take by mouth.    Marland Kitchen omeprazole (PRILOSEC) 20 MG capsule Take 20 mg by mouth daily.    . Polyethylene Glycol 3350 (MIRALAX PO) Take by mouth daily.    Marland Kitchen spironolactone (ALDACTONE) 25 MG tablet TAKE 1 TABLET (25 MG TOTAL) BY MOUTH 2 (TWO) TIMES DAILY AS NEEDED. 180 tablet 0  . vitamin C (ASCORBIC ACID) 500 MG tablet Take 500 mg by mouth daily.     No current facility-administered medications for this visit.     Allergies  Allergen Reactions  . Gabapentin Other (See Comments)    Nightmares   . Reglan [Metoclopramide] Other (See Comments)  . Statins Other (See Comments)  . Torsemide Other (See Comments)    Family History  Problem Relation Age of Onset  . Arthritis Mother   . Hypertension Mother   . Colon cancer Sister   . Heart disease Daughter   . Colon cancer Son   . Pancreatic cancer Son     Social History   Socioeconomic History  . Marital status: Widowed    Spouse name: Not on file  . Number of children: Not on file  .  Years of education: Not on file  . Highest education level: Not on file  Occupational History  . Not on file  Social Needs  . Financial resource strain: Not on file  . Food insecurity:    Worry: Not on file    Inability: Not on file  . Transportation needs:    Medical: Not on file    Non-medical: Not on file  Tobacco Use  . Smoking status: Never Smoker  . Smokeless tobacco: Never Used  Substance and Sexual Activity  . Alcohol use: No  . Drug use: No  . Sexual activity: Never  Lifestyle  . Physical activity:    Days per week: Not on file    Minutes per session: Not on file  . Stress: Not on file  Relationships  . Social connections:    Talks on phone: Not on file    Gets together:  Not on file    Attends religious service: Not on file    Active member of club or organization: Not on file    Attends meetings of clubs or organizations: Not on file    Relationship status: Not on file  . Intimate partner violence:    Fear of current or ex partner: Not on file    Emotionally abused: Not on file    Physically abused: Not on file    Forced sexual activity: Not on file  Other Topics Concern  . Not on file  Social History Narrative   Lives at home with daughter, has a walker     Constitutional: Pt reports fatigue. Denies fever, malaise, headache or abrupt weight changes.  Respiratory: Denies difficulty breathing, shortness of breath, cough or sputum production.   Cardiovascular: Denies chest pain, chest tightness, palpitationst.  Gastrointestinal: Pt reports abdominal pain. Denies bloating, constipation, diarrhea or blood in the stool.  GU: Denies urgency, frequency, pain with urination, burning sensation, blood in urine, odor or discharge. Musculoskeletal: Pt reports decrease in ROM, difficulty with gait, joint pain. Denies muscle pain or joint  swelling.  Neurological: Pt reports difficulty with memory, problems with balance and coordination. Denies dizziness, difficulty with speech.    No other specific complaints in a complete review of systems (except as listed in HPI above).     Objective:   Physical Exam  BP 128/74   Pulse 80   Temp 98.5 F (36.9 C) (Oral)   SpO2 95%  Wt Readings from Last 3 Encounters:  02/04/18 214 lb 14.4 oz (97.5 kg)  01/24/18 213 lb (96.6 kg)  10/12/17 212 lb 8 oz (96.4 kg)    General: Appears her stated age, obese, chronically ill appearing, in NAD. Abdomen: Soft and generally tender. Hyperactive bowel sounds. No distention or masses noted. Musculoskeletal: Unable to assess, can not get out of wheelchair onto exam table.  Neurological: Alert. Oriented to person but not place or time.   BMET    Component Value Date/Time    NA 139 02/03/2018 0451   NA 141 09/11/2015 0920   K 4.9 02/03/2018 0451   CL 103 02/03/2018 0451   CO2 32 02/03/2018 0451   GLUCOSE 119 (H) 02/03/2018 0451   BUN 21 02/03/2018 0451   BUN 25 09/11/2015 0920   CREATININE 1.09 (H) 02/03/2018 0451   CALCIUM 8.7 (L) 02/03/2018 0451   GFRNONAA 45 (L) 02/03/2018 0451   GFRAA 52 (L) 02/03/2018 0451    Lipid Panel     Component Value Date/Time   CHOL 172 06/12/2015 1125  TRIG 155.0 (H) 06/12/2015 1125   HDL 52.60 06/12/2015 1125   CHOLHDL 3 06/12/2015 1125   VLDL 31.0 06/12/2015 1125   LDLCALC 89 06/12/2015 1125    CBC    Component Value Date/Time   WBC 9.8 02/04/2018 0408   RBC 3.34 (L) 02/04/2018 0408   HGB 10.0 (L) 02/04/2018 0408   HCT 29.7 (L) 02/04/2018 0408   PLT 202 02/04/2018 0408   MCV 88.8 02/04/2018 0408   MCH 30.0 02/04/2018 0408   MCHC 33.7 02/04/2018 0408   RDW 14.8 (H) 02/04/2018 0408   LYMPHSABS 1.9 10/12/2017 1317   MONOABS 0.5 10/12/2017 1317   EOSABS 0.1 10/12/2017 1317   BASOSABS 0.0 10/12/2017 1317    Hgb A1C Lab Results  Component Value Date   HGBA1C 5.8 06/12/2015            Assessment & Plan:   [facility] Follow Up for Diverticular Bleed, Syncope and Lumbar Spinal Stenosis:  Hospital notes, labs and imaging reviewed Recheck CBC today She will keep scheduled follow up with GI and Neurosurgery Avoid NSAID's Continue Omeprazole  Chronic Abdominal Pain:  Advised her to take Fentanyl patches to sheriff's office for disposal Will restart Percocet 10-325 mg every 8 hours Add Tylenol 650 mg every 8 hours prn for breakthrough  Return precautions discussed Webb Silversmith, NP

## 2018-02-08 ENCOUNTER — Telehealth: Payer: Self-pay | Admitting: Internal Medicine

## 2018-02-08 DIAGNOSIS — I2510 Atherosclerotic heart disease of native coronary artery without angina pectoris: Secondary | ICD-10-CM | POA: Diagnosis not present

## 2018-02-08 DIAGNOSIS — Z86711 Personal history of pulmonary embolism: Secondary | ICD-10-CM | POA: Diagnosis not present

## 2018-02-08 DIAGNOSIS — Z85028 Personal history of other malignant neoplasm of stomach: Secondary | ICD-10-CM | POA: Diagnosis not present

## 2018-02-08 DIAGNOSIS — Z85038 Personal history of other malignant neoplasm of large intestine: Secondary | ICD-10-CM | POA: Diagnosis not present

## 2018-02-08 DIAGNOSIS — I130 Hypertensive heart and chronic kidney disease with heart failure and stage 1 through stage 4 chronic kidney disease, or unspecified chronic kidney disease: Secondary | ICD-10-CM | POA: Diagnosis not present

## 2018-02-08 DIAGNOSIS — I509 Heart failure, unspecified: Secondary | ICD-10-CM | POA: Diagnosis not present

## 2018-02-08 DIAGNOSIS — N189 Chronic kidney disease, unspecified: Secondary | ICD-10-CM | POA: Diagnosis not present

## 2018-02-08 DIAGNOSIS — Z86718 Personal history of other venous thrombosis and embolism: Secondary | ICD-10-CM | POA: Diagnosis not present

## 2018-02-08 DIAGNOSIS — K5731 Diverticulosis of large intestine without perforation or abscess with bleeding: Secondary | ICD-10-CM | POA: Diagnosis not present

## 2018-02-08 NOTE — Telephone Encounter (Signed)
OK for PT as requested 

## 2018-02-08 NOTE — Telephone Encounter (Signed)
Copied from [place] [id number]. Topic: Quick Communication - See Telephone Encounter >> Feb 08, 2018  3:07 PM Berneta Levins wrote: CRM for notification. See Telephone encounter for: 02/08/18.  Stacy with [facility] calling, states she needs verbal orders for PT 2x a week for 4 weeks  Marzetta Board can be reached at [phone]

## 2018-02-09 NOTE — Telephone Encounter (Signed)
Left message on voicemail w/ VO 

## 2018-02-10 ENCOUNTER — Inpatient Hospital Stay: Payer: Medicare Other

## 2018-02-10 ENCOUNTER — Inpatient Hospital Stay: Payer: Medicare Other | Admitting: Internal Medicine

## 2018-02-10 DIAGNOSIS — I130 Hypertensive heart and chronic kidney disease with heart failure and stage 1 through stage 4 chronic kidney disease, or unspecified chronic kidney disease: Secondary | ICD-10-CM | POA: Diagnosis not present

## 2018-02-10 DIAGNOSIS — N189 Chronic kidney disease, unspecified: Secondary | ICD-10-CM | POA: Diagnosis not present

## 2018-02-10 DIAGNOSIS — I2510 Atherosclerotic heart disease of native coronary artery without angina pectoris: Secondary | ICD-10-CM | POA: Diagnosis not present

## 2018-02-10 DIAGNOSIS — K5731 Diverticulosis of large intestine without perforation or abscess with bleeding: Secondary | ICD-10-CM | POA: Diagnosis not present

## 2018-02-10 DIAGNOSIS — Z85028 Personal history of other malignant neoplasm of stomach: Secondary | ICD-10-CM | POA: Diagnosis not present

## 2018-02-10 DIAGNOSIS — I509 Heart failure, unspecified: Secondary | ICD-10-CM | POA: Diagnosis not present

## 2018-02-13 ENCOUNTER — Telehealth: Payer: Self-pay | Admitting: Internal Medicine

## 2018-02-13 ENCOUNTER — Ambulatory Visit: Payer: Medicare Other | Admitting: Gastroenterology

## 2018-02-13 DIAGNOSIS — I509 Heart failure, unspecified: Secondary | ICD-10-CM | POA: Diagnosis not present

## 2018-02-13 DIAGNOSIS — Z85028 Personal history of other malignant neoplasm of stomach: Secondary | ICD-10-CM | POA: Diagnosis not present

## 2018-02-13 DIAGNOSIS — I2510 Atherosclerotic heart disease of native coronary artery without angina pectoris: Secondary | ICD-10-CM | POA: Diagnosis not present

## 2018-02-13 DIAGNOSIS — K5731 Diverticulosis of large intestine without perforation or abscess with bleeding: Secondary | ICD-10-CM | POA: Diagnosis not present

## 2018-02-13 DIAGNOSIS — N189 Chronic kidney disease, unspecified: Secondary | ICD-10-CM | POA: Diagnosis not present

## 2018-02-13 DIAGNOSIS — I130 Hypertensive heart and chronic kidney disease with heart failure and stage 1 through stage 4 chronic kidney disease, or unspecified chronic kidney disease: Secondary | ICD-10-CM | POA: Diagnosis not present

## 2018-02-13 NOTE — Telephone Encounter (Signed)
Copied from [place] [phone]. Topic: Quick Communication - See Telephone Encounter >> Feb 13, 2018  3:58 PM Percell Belt A wrote: CRM for notification. See Telephone encounter for: 02/13/18.  Izora Gala with [facility] [phone] Need verbals for OT  2 week 1 1 week 1

## 2018-02-14 ENCOUNTER — Encounter: Payer: Self-pay | Admitting: Internal Medicine

## 2018-02-14 ENCOUNTER — Ambulatory Visit (INDEPENDENT_AMBULATORY_CARE_PROVIDER_SITE_OTHER): Payer: Medicare Other | Admitting: Internal Medicine

## 2018-02-14 VITALS — BP 132/86 | HR 65 | Temp 98.6°F | Ht 63.0 in | Wt 207.0 lb

## 2018-02-14 DIAGNOSIS — I639 Cerebral infarction, unspecified: Secondary | ICD-10-CM

## 2018-02-14 DIAGNOSIS — K5731 Diverticulosis of large intestine without perforation or abscess with bleeding: Secondary | ICD-10-CM | POA: Diagnosis not present

## 2018-02-14 DIAGNOSIS — N183 Chronic kidney disease, stage 3 unspecified: Secondary | ICD-10-CM | POA: Insufficient documentation

## 2018-02-14 DIAGNOSIS — K219 Gastro-esophageal reflux disease without esophagitis: Secondary | ICD-10-CM | POA: Diagnosis not present

## 2018-02-14 DIAGNOSIS — Z0000 Encounter for general adult medical examination without abnormal findings: Secondary | ICD-10-CM

## 2018-02-14 DIAGNOSIS — C189 Malignant neoplasm of colon, unspecified: Secondary | ICD-10-CM | POA: Diagnosis not present

## 2018-02-14 DIAGNOSIS — C772 Secondary and unspecified malignant neoplasm of intra-abdominal lymph nodes: Secondary | ICD-10-CM

## 2018-02-14 DIAGNOSIS — I10 Essential (primary) hypertension: Secondary | ICD-10-CM

## 2018-02-14 DIAGNOSIS — I2510 Atherosclerotic heart disease of native coronary artery without angina pectoris: Secondary | ICD-10-CM

## 2018-02-14 DIAGNOSIS — I130 Hypertensive heart and chronic kidney disease with heart failure and stage 1 through stage 4 chronic kidney disease, or unspecified chronic kidney disease: Secondary | ICD-10-CM | POA: Diagnosis not present

## 2018-02-14 DIAGNOSIS — I509 Heart failure, unspecified: Secondary | ICD-10-CM | POA: Diagnosis not present

## 2018-02-14 DIAGNOSIS — M150 Primary generalized (osteo)arthritis: Secondary | ICD-10-CM | POA: Diagnosis not present

## 2018-02-14 DIAGNOSIS — Z85028 Personal history of other malignant neoplasm of stomach: Secondary | ICD-10-CM | POA: Diagnosis not present

## 2018-02-14 DIAGNOSIS — I5032 Chronic diastolic (congestive) heart failure: Secondary | ICD-10-CM | POA: Diagnosis not present

## 2018-02-14 DIAGNOSIS — N189 Chronic kidney disease, unspecified: Secondary | ICD-10-CM | POA: Diagnosis not present

## 2018-02-14 DIAGNOSIS — M159 Polyosteoarthritis, unspecified: Secondary | ICD-10-CM

## 2018-02-14 MED ORDER — SERTRALINE HCL 25 MG PO TABS: 25.0000 mg | ORAL_TABLET | Freq: Every day | ORAL | 2 refills | Status: DC

## 2018-02-14 MED ORDER — OXYCODONE-ACETAMINOPHEN 10-325 MG PO TABS: 1.0000 | ORAL_TABLET | Freq: Three times a day (TID) | ORAL | 0 refills | Status: DC | PRN

## 2018-02-14 NOTE — Assessment & Plan Note (Signed)
Continue Omeprazole She will continue to follow with GI

## 2018-02-14 NOTE — Assessment & Plan Note (Signed)
No chest pain No indication for statin at this time She will continue to follow with cardiology

## 2018-02-14 NOTE — Patient Instructions (Signed)
Health Maintenance for Postmenopausal Women Menopause is a normal process in which your reproductive ability comes to an end. This process happens gradually over a span of months to years, usually between the ages of 22 and 9. Menopause is complete when you have missed 12 consecutive menstrual periods. It is important to talk with your health care provider about some of the most common conditions that affect postmenopausal women, such as heart disease, cancer, and bone loss (osteoporosis). Adopting a healthy lifestyle and getting preventive care can help to promote your health and wellness. Those actions can also lower your chances of developing some of these common conditions. What should I know about menopause? During menopause, you may experience a number of symptoms, such as:  Moderate-to-severe hot flashes.  Night sweats.  Decrease in sex drive.  Mood swings.  Headaches.  Tiredness.  Irritability.  Memory problems.  Insomnia.  Choosing to treat or not to treat menopausal changes is an individual decision that you make with your health care provider. What should I know about hormone replacement therapy and supplements? Hormone therapy products are effective for treating symptoms that are associated with menopause, such as hot flashes and night sweats. Hormone replacement carries certain risks, especially as you become older. If you are thinking about using estrogen or estrogen with progestin treatments, discuss the benefits and risks with your health care provider. What should I know about heart disease and stroke? Heart disease, heart attack, and stroke become more likely as you age. This may be due, in part, to the hormonal changes that your body experiences during menopause. These can affect how your body processes dietary fats, triglycerides, and cholesterol. Heart attack and stroke are both medical emergencies. There are many things that you can do to help prevent heart disease  and stroke:  Have your blood pressure checked at least every 1-2 years. High blood pressure causes heart disease and increases the risk of stroke.  If you are 53-22 years old, ask your health care provider if you should take aspirin to prevent a heart attack or a stroke.  Do not use any tobacco products, including cigarettes, chewing tobacco, or electronic cigarettes. If you need help quitting, ask your health care provider.  It is important to eat a healthy diet and maintain a healthy weight. ? Be sure to include plenty of vegetables, fruits, low-fat dairy products, and lean protein. ? Avoid eating foods that are high in solid fats, added sugars, or salt (sodium).  Get regular exercise. This is one of the most important things that you can do for your health. ? Try to exercise for at least 150 minutes each week. The type of exercise that you do should increase your heart rate and make you sweat. This is known as moderate-intensity exercise. ? Try to do strengthening exercises at least twice each week. Do these in addition to the moderate-intensity exercise.  Know your numbers.Ask your health care provider to check your cholesterol and your blood glucose. Continue to have your blood tested as directed by your health care provider.  What should I know about cancer screening? There are several types of cancer. Take the following steps to reduce your risk and to catch any cancer development as early as possible. Breast Cancer  Practice breast self-awareness. ? This means understanding how your breasts normally appear and feel. ? It also means doing regular breast self-exams. Let your health care provider know about any changes, no matter how small.  If you are 40  or older, have a clinician do a breast exam (clinical breast exam or CBE) every year. Depending on your age, family history, and medical history, it may be recommended that you also have a yearly breast X-ray (mammogram).  If you  have a family history of breast cancer, talk with your health care provider about genetic screening.  If you are at high risk for breast cancer, talk with your health care provider about having an MRI and a mammogram every year.  Breast cancer (BRCA) gene test is recommended for women who have family members with BRCA-related cancers. Results of the assessment will determine the need for genetic counseling and BRCA1 and for BRCA2 testing. BRCA-related cancers include these types: ? Breast. This occurs in males or females. ? Ovarian. ? Tubal. This may also be called fallopian tube cancer. ? Cancer of the abdominal or pelvic lining (peritoneal cancer). ? Prostate. ? Pancreatic.  Cervical, Uterine, and Ovarian Cancer Your health care provider may recommend that you be screened regularly for cancer of the pelvic organs. These include your ovaries, uterus, and vagina. This screening involves a pelvic exam, which includes checking for microscopic changes to the surface of your cervix (Pap test).  For women ages 21-65, health care providers may recommend a pelvic exam and a Pap test every three years. For women ages 79-65, they may recommend the Pap test and pelvic exam, combined with testing for human papilloma virus (HPV), every five years. Some types of HPV increase your risk of cervical cancer. Testing for HPV may also be done on women of any age who have unclear Pap test results.  Other health care providers may not recommend any screening for nonpregnant women who are considered low risk for pelvic cancer and have no symptoms. Ask your health care provider if a screening pelvic exam is right for you.  If you have had past treatment for cervical cancer or a condition that could lead to cancer, you need Pap tests and screening for cancer for at least 20 years after your treatment. If Pap tests have been discontinued for you, your risk factors (such as having a new sexual partner) need to be  reassessed to determine if you should start having screenings again. Some women have medical problems that increase the chance of getting cervical cancer. In these cases, your health care provider may recommend that you have screening and Pap tests more often.  If you have a family history of uterine cancer or ovarian cancer, talk with your health care provider about genetic screening.  If you have vaginal bleeding after reaching menopause, tell your health care provider.  There are currently no reliable tests available to screen for ovarian cancer.  Lung Cancer Lung cancer screening is recommended for adults 69-62 years old who are at high risk for lung cancer because of a history of smoking. A yearly low-dose CT scan of the lungs is recommended if you:  Currently smoke.  Have a history of at least 30 pack-years of smoking and you currently smoke or have quit within the past 15 years. A pack-year is smoking an average of one pack of cigarettes per day for one year.  Yearly screening should:  Continue until it has been 15 years since you quit.  Stop if you develop a health problem that would prevent you from having lung cancer treatment.  Colorectal Cancer  This type of cancer can be detected and can often be prevented.  Routine colorectal cancer screening usually begins at  age 42 and continues through age 45.  If you have risk factors for colon cancer, your health care provider may recommend that you be screened at an earlier age.  If you have a family history of colorectal cancer, talk with your health care provider about genetic screening.  Your health care provider may also recommend using home test kits to check for hidden blood in your stool.  A small camera at the end of a tube can be used to examine your colon directly (sigmoidoscopy or colonoscopy). This is done to check for the earliest forms of colorectal cancer.  Direct examination of the colon should be repeated every  5-10 years until age 71. However, if early forms of precancerous polyps or small growths are found or if you have a family history or genetic risk for colorectal cancer, you may need to be screened more often.  Skin Cancer  Check your skin from head to toe regularly.  Monitor any moles. Be sure to tell your health care provider: ? About any new moles or changes in moles, especially if there is a change in a mole's shape or color. ? If you have a mole that is larger than the size of a pencil eraser.  If any of your family members has a history of skin cancer, especially at a young age, talk with your health care provider about genetic screening.  Always use sunscreen. Apply sunscreen liberally and repeatedly throughout the day.  Whenever you are outside, protect yourself by wearing long sleeves, pants, a wide-brimmed hat, and sunglasses.  What should I know about osteoporosis? Osteoporosis is a condition in which bone destruction happens more quickly than new bone creation. After menopause, you may be at an increased risk for osteoporosis. To help prevent osteoporosis or the bone fractures that can happen because of osteoporosis, the following is recommended:  If you are 46-71 years old, get at least 1,000 mg of calcium and at least 600 mg of vitamin D per day.  If you are older than age 55 but younger than age 65, get at least 1,200 mg of calcium and at least 600 mg of vitamin D per day.  If you are older than age 54, get at least 1,200 mg of calcium and at least 800 mg of vitamin D per day.  Smoking and excessive alcohol intake increase the risk of osteoporosis. Eat foods that are rich in calcium and vitamin D, and do weight-bearing exercises several times each week as directed by your health care provider. What should I know about how menopause affects my mental health? Depression may occur at any age, but it is more common as you become older. Common symptoms of depression  include:  Low or sad mood.  Changes in sleep patterns.  Changes in appetite or eating patterns.  Feeling an overall lack of motivation or enjoyment of activities that you previously enjoyed.  Frequent crying spells.  Talk with your health care provider if you think that you are experiencing depression. What should I know about immunizations? It is important that you get and maintain your immunizations. These include:  Tetanus, diphtheria, and pertussis (Tdap) booster vaccine.  Influenza every year before the flu season begins.  Pneumonia vaccine.  Shingles vaccine.  Your health care provider may also recommend other immunizations. This information is not intended to replace advice given to you by your health care provider. Make sure you discuss any questions you have with your health care provider. Document Released: 08/06/2005  Document Revised: 01/02/2016 Document Reviewed: 03/18/2015 Elsevier Interactive Patient Education  2018 [employer]  

## 2018-02-14 NOTE — Assessment & Plan Note (Signed)
Compensated Continue Lasix and Spironolactone She will continue to follow with cardiology

## 2018-02-14 NOTE — Progress Notes (Signed)
HPI:  Pt presents to the clinic today for her Medicare Wellness Exam. She is also due to follow up chronic conditions and for pain medication follow up.  CHF: Compensated. She does report some chronic cough. She is taking Lasix and Spironolactone as prescribed. Echo from 01/2018 reviewed. She follows with Dr. Rockey Situ.  CAD: No recent angina. She is not taking any medications for this at this time.   GERD: Chronic but stable on Omeprazole. She denies breakthrough symptoms. She has a follow up with GI at the end of the month.   HTN: Her BP today is 132/86. She takes Lasix and Spironolactone as prescribed.   CKD 3: Last creatinine 1.07, GFR > 50. She is not on anything for renal protection. She does not follow with nephrology.  History of Colon/Stomach Cancer: In remission. CEA's remain elevated but imaging can not find any active disease. She follows up with oncology on Friday.  OA: Mainly in her back and knees. Managed on Percocet TID. She is limited weight bearing, currently working with PT/OT.  Indication for chronic opioid: Chronic back and knee pain, chronic abdominal pain. Medication and dose: Percocet 10-325 mg tabs, 1 tab PO TID # pills per month: 90 Last UDS date: never Opioid Treatment Agreement signed (Y/N): never [place] reviewed this encounter (include red flags):  02/14/2018  Past Medical History:  Diagnosis Date  . CHF (congestive heart failure) ([place])   . Colon cancer ([place])   . Coronary artery disease   . DVT (deep venous thrombosis) ([place])   . GERD (gastroesophageal reflux disease)   . Hypertension   . Malignant neoplasm of stomach ([place])   . PE (pulmonary embolism)   . Renal disorder   . Renal insufficiency     Current Outpatient Medications  Medication Sig Dispense Refill  . Cholecalciferol (VITAMIN D-3) 1000 UNITS CAPS Take 2 capsules by mouth daily.    . COMBIGAN 0.2-0.5 % ophthalmic solution Place 1 drop into both eyes 2 (two) times daily.  4  . furosemide  (LASIX) 40 MG tablet Take 1 tablet (40 mg) by mouth once daily 30 tablet 3  . Magnesium Oxide 500 MG TABS Take 1 tablet by mouth 2 (two) times daily.    Marland Kitchen MEGARED OMEGA-3 KRILL OIL 500 MG CAPS Take by mouth.    Marland Kitchen omeprazole (PRILOSEC) 20 MG capsule Take 20 mg by mouth daily.    Marland Kitchen oxyCODONE-acetaminophen (PERCOCET) 10-325 MG tablet Take 1 tablet by mouth every 8 (eight) hours as needed for pain.    . Polyethylene Glycol 3350 (MIRALAX PO) Take by mouth daily.    Marland Kitchen spironolactone (ALDACTONE) 25 MG tablet TAKE 1 TABLET (25 MG TOTAL) BY MOUTH 2 (TWO) TIMES DAILY AS NEEDED. 180 tablet 0  . vitamin C (ASCORBIC ACID) 500 MG tablet Take 500 mg by mouth daily.     No current facility-administered medications for this visit.     Allergies  Allergen Reactions  . Gabapentin Other (See Comments)    Nightmares   . Reglan [Metoclopramide] Other (See Comments)  . Statins Other (See Comments)  . Torsemide Other (See Comments)    Family History  Problem Relation Age of Onset  . Arthritis Mother   . Hypertension Mother   . Colon cancer Sister   . Heart disease Daughter   . Colon cancer Son   . Pancreatic cancer Son     Social History   Socioeconomic History  . Marital status: Widowed    Spouse name: Not on  file  . Number of children: Not on file  . Years of education: Not on file  . Highest education level: Not on file  Occupational History  . Not on file  Social Needs  . Financial resource strain: Not on file  . Food insecurity:    Worry: Not on file    Inability: Not on file  . Transportation needs:    Medical: Not on file    Non-medical: Not on file  Tobacco Use  . Smoking status: Never Smoker  . Smokeless tobacco: Never Used  Substance and Sexual Activity  . Alcohol use: No  . Drug use: No  . Sexual activity: Never  Lifestyle  . Physical activity:    Days per week: Not on file    Minutes per session: Not on file  . Stress: Not on file  Relationships  . Social  connections:    Talks on phone: Not on file    Gets together: Not on file    Attends religious service: Not on file    Active member of club or organization: Not on file    Attends meetings of clubs or organizations: Not on file    Relationship status: Not on file  . Intimate partner violence:    Fear of current or ex partner: Not on file    Emotionally abused: Not on file    Physically abused: Not on file    Forced sexual activity: Not on file  Other Topics Concern  . Not on file  Social History Narrative   Lives at home with daughter, has a walker    Hospitiliaztions: 01/2018, diverticulilitis  Health Maintenance:    Flu: 02/2017  Tetanus: > 10 years ago  Pneumovax: unsure  Prevnar: unsure  Zostavax: never  Shingrix: never  Mammogram: No longer screening  Pap Smear: No longer screening  Bone Density: No longer screening  Colon Screening: 01/2018  Eye Doctor: as needed  Dental Exam: as needed   Providers:   PCP: Webb Silversmith, NP-C  Oncologist: Dr. Rogue Bussing  Cardiologist: Dr. Rockey Situ  Gastroenterologist: Dr. Vicente Males     I have personally reviewed and have noted:  1. The patient's medical and social history 2. Their use of alcohol, tobacco or illicit drugs 3. Their current medications and supplements 4. The patient's functional ability including ADL's, fall risks, home safety risks and hearing or visual impairment. 5. Diet and physical activities 6. Evidence for depression or mood disorder  Subjective:   Review of Systems:   Constitutional: Denies fever, malaise, fatigue, headache or abrupt weight changes.  HEENT: Pt reports blurred vision (gluacoma and cataracts). Denies eye pain, eye redness, ear pain, ringing in the ears, wax buildup, runny nose, nasal congestion, bloody nose, or sore throat. Respiratory: Pt reports intermittent cough. Denies difficulty breathing, shortness of breath, or sputum production.   Cardiovascular: Pt reports swelling in legs. Denies  chest pain, chest tightness, palpitations or swelling in the hands.  Gastrointestinal: Pt reports chronic abdominal pain. Denies bloating, constipation, diarrhea or blood in the stool.  GU: Pt reports mixed incontinence. Denies urgency, frequency, pain with urination, burning sensation, blood in urine, odor or discharge. Musculoskeletal: Pt reports chronic back and knee pain. Denies decrease in range of motion, difficulty with gait, muscle pain or joint swelling.  Skin: Denies redness, rashes, lesions or ulcercations.  Neurological: Pt reports problems with balance and coordination. Denies dizziness, difficulty with memory, difficulty with speech.  Psych: Denies anxiety, depression, SI/HI.  No other specific complaints  in a complete review of systems (except as listed in HPI above).  Objective:  PE:   BP 132/86   Pulse 65   Temp 98.6 F (37 C) (Oral)   Ht 5\' 3"  (1.6 m)   Wt 207 lb (93.9 kg)   SpO2 97%   BMI 36.67 kg/m  Wt Readings from Last 3 Encounters:  02/14/18 207 lb (93.9 kg)  02/04/18 214 lb 14.4 oz (97.5 kg)  01/24/18 213 lb (96.6 kg)    General: Appears her stated age, obese, in NAD. Skin: Warm, dry and intact.  HEENT: Head: normal shape and size;  Ears: Tm's gray and intact, normal light reflex; Throat/Mouth: Teeth present, mucosa pink and moist, no exudate, lesions or ulcerations noted.  Neck: Neck supple, trachea midline. No masses, lumps present.  Cardiovascular: Normal rate and rhythm. Split S1,S2 noted.  No murmur, rubs or gallops noted. 1+ non pitting BLE edema. No carotid bruits noted. Pulmonary/Chest: Normal effort and positive vesicular breath sounds. No respiratory distress. No wheezes, rales or ronchi noted.  Abdomen: Soft and very tender in the periumbilical region. Normal bowel sounds. No distention or masses noted.  Musculoskeletal: In wheelchair. Hand grips equal.   Neurological: Alert and oriented.  Psychiatric: Dysthymic.  BMET    Component Value  Date/Time   NA 139 02/03/2018 0451   NA 141 09/11/2015 0920   K 4.9 02/03/2018 0451   CL 103 02/03/2018 0451   CO2 32 02/03/2018 0451   GLUCOSE 119 (H) 02/03/2018 0451   BUN 21 02/03/2018 0451   BUN 25 09/11/2015 0920   CREATININE 1.09 (H) 02/03/2018 0451   CALCIUM 8.7 (L) 02/03/2018 0451   GFRNONAA 45 (L) 02/03/2018 0451   GFRAA 52 (L) 02/03/2018 0451    Lipid Panel     Component Value Date/Time   CHOL 172 06/12/2015 1125   TRIG 155.0 (H) 06/12/2015 1125   HDL 52.60 06/12/2015 1125   CHOLHDL 3 06/12/2015 1125   VLDL 31.0 06/12/2015 1125   LDLCALC 89 06/12/2015 1125    CBC    Component Value Date/Time   WBC 8.6 02/07/2018 1106   RBC 3.72 (L) 02/07/2018 1106   HGB 11.1 (L) 02/07/2018 1106   HCT 33.2 (L) 02/07/2018 1106   PLT 293.0 02/07/2018 1106   MCV 89.1 02/07/2018 1106   MCH 30.0 02/04/2018 0408   MCHC 33.3 02/07/2018 1106   RDW 14.8 02/07/2018 1106   LYMPHSABS 1.9 10/12/2017 1317   MONOABS 0.5 10/12/2017 1317   EOSABS 0.1 10/12/2017 1317   BASOSABS 0.0 10/12/2017 1317    Hgb A1C Lab Results  Component Value Date   HGBA1C 5.8 06/12/2015      Assessment and Plan:   Medicare Annual Wellness Visit:  Diet: She does eat soft meats. She consumes fruits and veggies daily. She does eat fried foods. She drinks mostly water, tea. Physical activity: Sedentary Depression/mood screen: Positive Hearing: Intact to whispered voice Visual acuity: Grossly normal, performs annual eye exam  ADLs: Needs assist, currently getting home health Fall risk: High Home safety: Good Cognitive evaluation: Intact to orientation, naming, recall and repetition EOL planning: No adv directives, DNR/ I agree  Preventative Medicine: Encouraged her to get a flu shot in the fall. She declines tetanus, pneumovax, prevnar, zostovax and shingrix. She declines pap smear, mammogram, bone density. She would consider colonoscopy if needed given history of cancer. Encouraged her to consume a  balanced diet and exercise regimen. Advised her to see an eye doctor and dentist  annually. No indication for labs at this time.   Next appointment: 1 year, Medicare Wellness Exam   Webb Silversmith, NP

## 2018-02-14 NOTE — Assessment & Plan Note (Signed)
Chronic pain from this  Continue Percocet as scheduled Will not do CSA/UDS given her age [place] reviewed

## 2018-02-14 NOTE — Assessment & Plan Note (Signed)
In remission She continues to follow with oncology 

## 2018-02-14 NOTE — Assessment & Plan Note (Signed)
Not on ACEI or ARB Will monitor creatinine

## 2018-02-14 NOTE — Telephone Encounter (Signed)
[place] for verbal orders for OT as requested

## 2018-02-14 NOTE — Assessment & Plan Note (Signed)
Stable on Lasix and Spironolactone Recent CMET reviewed

## 2018-02-15 NOTE — Telephone Encounter (Signed)
VO left on VM.  °

## 2018-02-16 DIAGNOSIS — I2510 Atherosclerotic heart disease of native coronary artery without angina pectoris: Secondary | ICD-10-CM | POA: Diagnosis not present

## 2018-02-16 DIAGNOSIS — Z85028 Personal history of other malignant neoplasm of stomach: Secondary | ICD-10-CM | POA: Diagnosis not present

## 2018-02-16 DIAGNOSIS — I509 Heart failure, unspecified: Secondary | ICD-10-CM | POA: Diagnosis not present

## 2018-02-16 DIAGNOSIS — N189 Chronic kidney disease, unspecified: Secondary | ICD-10-CM | POA: Diagnosis not present

## 2018-02-16 DIAGNOSIS — I130 Hypertensive heart and chronic kidney disease with heart failure and stage 1 through stage 4 chronic kidney disease, or unspecified chronic kidney disease: Secondary | ICD-10-CM | POA: Diagnosis not present

## 2018-02-16 DIAGNOSIS — K5731 Diverticulosis of large intestine without perforation or abscess with bleeding: Secondary | ICD-10-CM | POA: Diagnosis not present

## 2018-02-20 DIAGNOSIS — Z85028 Personal history of other malignant neoplasm of stomach: Secondary | ICD-10-CM | POA: Diagnosis not present

## 2018-02-20 DIAGNOSIS — N189 Chronic kidney disease, unspecified: Secondary | ICD-10-CM | POA: Diagnosis not present

## 2018-02-20 DIAGNOSIS — I130 Hypertensive heart and chronic kidney disease with heart failure and stage 1 through stage 4 chronic kidney disease, or unspecified chronic kidney disease: Secondary | ICD-10-CM | POA: Diagnosis not present

## 2018-02-20 DIAGNOSIS — K5731 Diverticulosis of large intestine without perforation or abscess with bleeding: Secondary | ICD-10-CM | POA: Diagnosis not present

## 2018-02-20 DIAGNOSIS — I509 Heart failure, unspecified: Secondary | ICD-10-CM | POA: Diagnosis not present

## 2018-02-20 DIAGNOSIS — I2510 Atherosclerotic heart disease of native coronary artery without angina pectoris: Secondary | ICD-10-CM | POA: Diagnosis not present

## 2018-02-22 ENCOUNTER — Telehealth: Payer: Self-pay | Admitting: Internal Medicine

## 2018-02-22 DIAGNOSIS — I509 Heart failure, unspecified: Secondary | ICD-10-CM | POA: Diagnosis not present

## 2018-02-22 DIAGNOSIS — K5731 Diverticulosis of large intestine without perforation or abscess with bleeding: Secondary | ICD-10-CM | POA: Diagnosis not present

## 2018-02-22 DIAGNOSIS — I130 Hypertensive heart and chronic kidney disease with heart failure and stage 1 through stage 4 chronic kidney disease, or unspecified chronic kidney disease: Secondary | ICD-10-CM | POA: Diagnosis not present

## 2018-02-22 DIAGNOSIS — I2510 Atherosclerotic heart disease of native coronary artery without angina pectoris: Secondary | ICD-10-CM | POA: Diagnosis not present

## 2018-02-22 DIAGNOSIS — N189 Chronic kidney disease, unspecified: Secondary | ICD-10-CM | POA: Diagnosis not present

## 2018-02-22 DIAGNOSIS — Z85028 Personal history of other malignant neoplasm of stomach: Secondary | ICD-10-CM | POA: Diagnosis not present

## 2018-02-22 NOTE — Telephone Encounter (Signed)
Pt's daughter dropped off [place] form to be filled out for aid and attendance. Placed in [place] tower.

## 2018-02-23 DIAGNOSIS — M48061 Spinal stenosis, lumbar region without neurogenic claudication: Secondary | ICD-10-CM | POA: Diagnosis not present

## 2018-02-23 DIAGNOSIS — I130 Hypertensive heart and chronic kidney disease with heart failure and stage 1 through stage 4 chronic kidney disease, or unspecified chronic kidney disease: Secondary | ICD-10-CM | POA: Diagnosis not present

## 2018-02-23 DIAGNOSIS — K5731 Diverticulosis of large intestine without perforation or abscess with bleeding: Secondary | ICD-10-CM | POA: Diagnosis not present

## 2018-02-23 DIAGNOSIS — Z85028 Personal history of other malignant neoplasm of stomach: Secondary | ICD-10-CM | POA: Diagnosis not present

## 2018-02-23 DIAGNOSIS — I509 Heart failure, unspecified: Secondary | ICD-10-CM | POA: Diagnosis not present

## 2018-02-23 DIAGNOSIS — I2510 Atherosclerotic heart disease of native coronary artery without angina pectoris: Secondary | ICD-10-CM | POA: Diagnosis not present

## 2018-02-23 DIAGNOSIS — N189 Chronic kidney disease, unspecified: Secondary | ICD-10-CM | POA: Diagnosis not present

## 2018-02-24 ENCOUNTER — Other Ambulatory Visit: Payer: Self-pay

## 2018-02-24 ENCOUNTER — Inpatient Hospital Stay (HOSPITAL_BASED_OUTPATIENT_CLINIC_OR_DEPARTMENT_OTHER): Payer: Medicare Other | Admitting: Internal Medicine

## 2018-02-24 ENCOUNTER — Inpatient Hospital Stay: Payer: Medicare Other | Attending: Internal Medicine

## 2018-02-24 ENCOUNTER — Telehealth: Payer: Self-pay

## 2018-02-24 VITALS — BP 136/65 | HR 62 | Temp 97.6°F | Resp 20 | Ht 63.0 in | Wt 205.0 lb

## 2018-02-24 DIAGNOSIS — Z86718 Personal history of other venous thrombosis and embolism: Secondary | ICD-10-CM

## 2018-02-24 DIAGNOSIS — C189 Malignant neoplasm of colon, unspecified: Secondary | ICD-10-CM | POA: Diagnosis not present

## 2018-02-24 DIAGNOSIS — Z86711 Personal history of pulmonary embolism: Secondary | ICD-10-CM

## 2018-02-24 DIAGNOSIS — E041 Nontoxic single thyroid nodule: Secondary | ICD-10-CM | POA: Insufficient documentation

## 2018-02-24 DIAGNOSIS — Z800 Family history of malignant neoplasm of digestive organs: Secondary | ICD-10-CM | POA: Diagnosis not present

## 2018-02-24 DIAGNOSIS — R109 Unspecified abdominal pain: Secondary | ICD-10-CM

## 2018-02-24 DIAGNOSIS — I10 Essential (primary) hypertension: Secondary | ICD-10-CM

## 2018-02-24 DIAGNOSIS — C772 Secondary and unspecified malignant neoplasm of intra-abdominal lymph nodes: Secondary | ICD-10-CM | POA: Diagnosis not present

## 2018-02-24 DIAGNOSIS — K7689 Other specified diseases of liver: Secondary | ICD-10-CM | POA: Diagnosis not present

## 2018-02-24 DIAGNOSIS — R9389 Abnormal findings on diagnostic imaging of other specified body structures: Secondary | ICD-10-CM

## 2018-02-24 DIAGNOSIS — Z0279 Encounter for issue of other medical certificate: Secondary | ICD-10-CM

## 2018-02-24 DIAGNOSIS — G8929 Other chronic pain: Secondary | ICD-10-CM

## 2018-02-24 DIAGNOSIS — G3184 Mild cognitive impairment, so stated: Secondary | ICD-10-CM | POA: Insufficient documentation

## 2018-02-24 LAB — COMPREHENSIVE METABOLIC PANEL
ALT: 9 U/L (ref 0–44)
AST: 16 U/L (ref 15–41)
Albumin: 3.7 g/dL (ref 3.5–5.0)
Alkaline Phosphatase: 72 U/L (ref 38–126)
Anion gap: 9 (ref 5–15)
BILIRUBIN TOTAL: 0.4 mg/dL (ref 0.3–1.2)
BUN: 17 mg/dL (ref 8–23)
CALCIUM: 9.5 mg/dL (ref 8.9–10.3)
CO2: 28 mmol/L (ref 22–32)
CREATININE: 1.11 mg/dL — AB (ref 0.44–1.00)
Chloride: 101 mmol/L (ref 98–111)
GFR calc Af Amer: 51 mL/min — ABNORMAL LOW (ref >60)
GFR, EST NON AFRICAN AMERICAN: 44 mL/min — AB (ref >60)
Glucose, Bld: 126 mg/dL — ABNORMAL HIGH (ref 70–99)
Potassium: 4 mmol/L (ref 3.5–5.1)
Sodium: 138 mmol/L (ref 135–145)
TOTAL PROTEIN: 7.1 g/dL (ref 6.5–8.1)

## 2018-02-24 LAB — CBC WITH DIFFERENTIAL/PLATELET
BASOS ABS: 0 10*3/uL (ref 0–0.1)
Basophils Relative: 1 %
EOS ABS: 0.2 10*3/uL (ref 0–0.7)
Eosinophils Relative: 3 %
HCT: 35.2 % (ref 35.0–47.0)
Hemoglobin: 11.5 g/dL — ABNORMAL LOW (ref 12.0–16.0)
Lymphocytes Relative: 37 %
Lymphs Abs: 1.9 10*3/uL (ref 1.0–3.6)
MCH: 29.2 pg (ref 26.0–34.0)
MCHC: 32.7 g/dL (ref 32.0–36.0)
MCV: 89.2 fL (ref 80.0–100.0)
Monocytes Absolute: 0.4 10*3/uL (ref 0.2–0.9)
Monocytes Relative: 8 %
Neutro Abs: 2.7 10*3/uL (ref 1.4–6.5)
Neutrophils Relative %: 51 %
PLATELETS: 306 10*3/uL (ref 150–440)
RBC: 3.95 MIL/uL (ref 3.80–5.20)
RDW: 15 % — ABNORMAL HIGH (ref 11.5–14.5)
WBC: 5.2 10*3/uL (ref 3.6–11.0)

## 2018-02-24 NOTE — Telephone Encounter (Signed)
Pt daughter (norma) called and stated that she needs forms over to [place] today. Please give a call back. Cb#[phone]

## 2018-02-24 NOTE — Progress Notes (Signed)
[facility] OFFICE PROGRESS NOTE  Patient Care Team: Jearld Fenton, NP as PCP - General (Internal Medicine)  Cancer Staging No matching staging information was found for the patient.   Oncology History   # July 2016- COLON CANCER; mod diff adeno; STAGE III (pT4aN1a; MSI-STABLE) [s/p surgery;Dr. Arbutus Ped Adel];No adjuvant chemo sec to co-morbdities DEC 2016- CT-C/ A/P [[facility]]- subcentimeter hepatic cysts ; irregular soft tissue density in the peritoneal fat within pelvis ? Scarring  [less likely recurrence];    # EGD/colonoscopy- AUG 2019- NEG s/p 2u PRBC.   # left thyroid nodule- No Bx  # endometrial thickening. Biopsy not done because of cervical stenosis [Dr.Schermerhorn].  # July R LE DVT/Bil PE s/p IVC filter; Hx of GIB; AKI; Hx of Severe Anemia; hx CHF   ----------------------------------------------------------------------   DIAGNOSIS: COLON CANCER  STAGE:  III       ;GOALS: curative  CURRENT/MOST RECENT THERAPY : surveillance      Colon cancer metastasized to intra-abdominal lymph node (HCC)      INTERVAL HISTORY:  Jill Bowers 84 y.o.  female pleasant patient above history of colon cancer stage III high risk-2016; no adjuvant therapy secondary to multiple comorbidities with elevated CEA is here for follow-up.  The interim patient was admitted to hospital for anemia-had upper and lower GI endoscopy-for any malignancies.  Patient continues to have chronic abdominal pain.  Denies any constipation.   Review of Systems  Constitutional: Positive for malaise/fatigue. Negative for chills, diaphoresis, fever and weight loss.  HENT: Negative for nosebleeds and sore throat.   Eyes: Negative for double vision.  Respiratory: Negative for cough, hemoptysis, sputum production, shortness of breath and wheezing.   Cardiovascular: Negative for chest pain, palpitations, orthopnea and leg swelling.  Gastrointestinal: Positive for abdominal pain (Chronic  incisional abdominal pain.). Negative for blood in stool, constipation, diarrhea, heartburn, melena, nausea and vomiting.  Genitourinary: Negative for dysuria, frequency and urgency.  Musculoskeletal: Positive for back pain and joint pain.  Skin: Negative.  Negative for itching and rash.  Neurological: Negative for dizziness, tingling, focal weakness, weakness and headaches.  Endo/Heme/Allergies: Does not bruise/bleed easily.  Psychiatric/Behavioral: Negative for depression. The patient is not nervous/anxious and does not have insomnia.       PAST MEDICAL HISTORY :  Past Medical History:  Diagnosis Date  . CHF (congestive heart failure) ([place])   . Colon cancer ([place])   . Coronary artery disease   . DVT (deep venous thrombosis) ([place])   . GERD (gastroesophageal reflux disease)   . Hypertension   . Malignant neoplasm of stomach ([place])   . PE (pulmonary embolism)   . Renal disorder   . Renal insufficiency     PAST SURGICAL HISTORY :   Past Surgical History:  Procedure Laterality Date  . ABDOMINAL HYSTERECTOMY    . COLON SURGERY    . COLONOSCOPY N/A 02/01/2018   Procedure: COLONOSCOPY;  Surgeon: Virgel Manifold, MD;  Location: [facility] ENDOSCOPY;  Service: Endoscopy;  Laterality: N/A;  . COLONOSCOPY WITH PROPOFOL N/A 08/23/2016   Procedure: COLONOSCOPY WITH PROPOFOL;  Surgeon: Jonathon Bellows, MD;  Location: [facility] ENDOSCOPY;  Service: Gastroenterology;  Laterality: N/A;  . COLONOSCOPY WITH PROPOFOL N/A 08/24/2016   Procedure: COLONOSCOPY WITH PROPOFOL;  Surgeon: Jonathon Bellows, MD;  Location: [facility] ENDOSCOPY;  Service: Endoscopy;  Laterality: N/A;  . ESOPHAGOGASTRODUODENOSCOPY N/A 02/01/2018   Procedure: ESOPHAGOGASTRODUODENOSCOPY (EGD);  Surgeon: Virgel Manifold, MD;  Location[facility] ENDOSCOPY;  Service: Endoscopy;  Laterality: N/A;  .  IVC FILTER PLACEMENT ([facility] HX)    . JOINT REPLACEMENT Right    knee    FAMILY HISTORY :   Family History  Problem Relation Age of Onset  . Arthritis Mother    . Hypertension Mother   . Colon cancer Sister   . Heart disease Daughter   . Colon cancer Son   . Pancreatic cancer Son     SOCIAL HISTORY:   Social History   Tobacco Use  . Smoking status: Never Smoker  . Smokeless tobacco: Never Used  Substance Use Topics  . Alcohol use: No  . Drug use: No    ALLERGIES:  is allergic to gabapentin; reglan [metoclopramide]; statins; and torsemide.  MEDICATIONS:  Current Outpatient Medications  Medication Sig Dispense Refill  . Cholecalciferol (VITAMIN D-3) 1000 UNITS CAPS Take 2 capsules by mouth daily.    . COMBIGAN 0.2-0.5 % ophthalmic solution Place 1 drop into both eyes 2 (two) times daily.  4  . furosemide (LASIX) 40 MG tablet Take 1 tablet (40 mg) by mouth once daily 30 tablet 3  . Magnesium Oxide 500 MG TABS Take 1 tablet by mouth 2 (two) times daily.    Marland Kitchen MEGARED OMEGA-3 KRILL OIL 500 MG CAPS Take by mouth.    Marland Kitchen omeprazole (PRILOSEC) 20 MG capsule Take 20 mg by mouth daily.    Marland Kitchen oxyCODONE-acetaminophen (PERCOCET) 10-325 MG tablet Take 1 tablet by mouth every 8 (eight) hours as needed for pain. 30 tablet 0  . Polyethylene Glycol 3350 (MIRALAX PO) Take by mouth daily.    Marland Kitchen spironolactone (ALDACTONE) 25 MG tablet TAKE 1 TABLET (25 MG TOTAL) BY MOUTH 2 (TWO) TIMES DAILY AS NEEDED. 180 tablet 0  . vitamin C (ASCORBIC ACID) 500 MG tablet Take 500 mg by mouth daily.     No current facility-administered medications for this visit.     PHYSICAL EXAMINATION: ECOG PERFORMANCE STATUS: 3 - Symptomatic, >50% confined to bed  BP 136/65 (Patient Position: Sitting)   Pulse 62   Temp 97.6 F (36.4 C) (Tympanic)   Resp 20   Ht '5\' 3"'  (1.6 m)   Wt 205 lb (93 kg)   BMI 36.31 kg/m   Filed Weights   02/24/18 1448  Weight: 205 lb (93 kg)    Physical Exam  Constitutional: She is oriented to person, place, and time and well-developed, well-nourished, and in no distress.  Frail-appearing.  Resting in wheelchair.  Accompanied by daughter.   HENT:  Head: Normocephalic and atraumatic.  Mouth/Throat: Oropharynx is clear and moist. No oropharyngeal exudate.  Eyes: Pupils are equal, round, and reactive to light.  Neck: Normal range of motion. Neck supple.  Cardiovascular: Normal rate and regular rhythm.  Pulmonary/Chest: No respiratory distress. She has no wheezes.  Abdominal: Soft. Bowel sounds are normal. She exhibits no distension and no mass. There is no tenderness. There is no rebound and no guarding.  Musculoskeletal: Normal range of motion. She exhibits no edema or tenderness.  Neurological: She is alert and oriented to person, place, and time.  Skin: Skin is warm.  Psychiatric: Affect normal.       LABORATORY DATA:  I have reviewed the data as listed    Component Value Date/Time   NA 138 02/24/2018 1420   NA 141 09/11/2015 0920   K 4.0 02/24/2018 1420   CL 101 02/24/2018 1420   CO2 28 02/24/2018 1420   GLUCOSE 126 (H) 02/24/2018 1420   BUN 17 02/24/2018 1420   BUN 25  09/11/2015 0920   CREATININE 1.11 (H) 02/24/2018 1420   CALCIUM 9.5 02/24/2018 1420   PROT 7.1 02/24/2018 1420   ALBUMIN 3.7 02/24/2018 1420   AST 16 02/24/2018 1420   ALT 9 02/24/2018 1420   ALKPHOS 72 02/24/2018 1420   BILITOT 0.4 02/24/2018 1420   GFRNONAA 44 (L) 02/24/2018 1420   GFRAA 51 (L) 02/24/2018 1420    No results found for: SPEP, UPEP  Lab Results  Component Value Date   WBC 5.2 02/24/2018   NEUTROABS 2.7 02/24/2018   HGB 11.5 (L) 02/24/2018   HCT 35.2 02/24/2018   MCV 89.2 02/24/2018   PLT 306 02/24/2018      Chemistry      Component Value Date/Time   NA 138 02/24/2018 1420   NA 141 09/11/2015 0920   K 4.0 02/24/2018 1420   CL 101 02/24/2018 1420   CO2 28 02/24/2018 1420   BUN 17 02/24/2018 1420   BUN 25 09/11/2015 0920   CREATININE 1.11 (H) 02/24/2018 1420      Component Value Date/Time   CALCIUM 9.5 02/24/2018 1420   ALKPHOS 72 02/24/2018 1420   AST 16 02/24/2018 1420   ALT 9 02/24/2018 1420    BILITOT 0.4 02/24/2018 1420       RADIOGRAPHIC STUDIES: I have personally reviewed the radiological images as listed and agreed with the findings in the report. No results found.   ASSESSMENT & PLAN:  Colon cancer metastasized to intra-abdominal lymph node ([place]) # Colon cancer-stage III [2016-pT4N1a] with multiple liver cysts on the CT scan]. No adjuvant therapy [ given multiple comorbidities for performance status].   # PET FEB 2019- NEG; August 2019-colonoscopy/EGD-negative; however CEA rising-December 2018-60.  # no obvious clinical signs/Symptoms of progressive disease [abdominal pain chronic-see discussion below].  Stable.  # Abdominal pain- unclear etiology. Incisional question neuropathic.poor tolerance to gabapentin/amitriptyline; on oxycodone 3 pills a day.  Stable.  #Endometrial thickening- unable to do biopsy; secondary to cervical stenosis [Dr.Schermerhorn] stable.  # Left thyroid nodule-stable on PET scan.  Follow up in 6 months/lab-CEA; will call [phone]/daughter- re: PET scan in 6 months.   Addendum: Left a message for the patient's daughter to call us back to discuss elevated tumor marker-up to 129/question repeat imaging.   Orders Placed This Encounter  Procedures  . CBC with Differential/Platelet    Standing Status:   Future    Standing Expiration Date:   02/25/2019  . Comprehensive metabolic panel    Standing Status:   Future    Standing Expiration Date:   02/25/2019  . CEA    Standing Status:   Future    Standing Expiration Date:   02/25/2019   All questions were answered. The patient knows to call the clinic with any problems, questions or concerns.      Cammie Sickle, MD 02/28/2018 3:58 PM

## 2018-02-24 NOTE — Telephone Encounter (Signed)
Placed in front office with other form for [place] that had been faxed... Pt's daughter is aware

## 2018-02-24 NOTE — Assessment & Plan Note (Addendum)
#   Colon cancer-stage III [2016-pT4N1a] with multiple liver cysts on the CT scan]. No adjuvant therapy [ given multiple comorbidities for performance status].   # PET FEB 2019- NEG; August 2019-colonoscopy/EGD-negative; however CEA rising-December 2018-60.  # no obvious clinical signs/Symptoms of progressive disease [abdominal pain chronic-see discussion below].  Stable.  # Abdominal pain- unclear etiology. Incisional question neuropathic.poor tolerance to gabapentin/amitriptyline; on oxycodone 3 pills a day.  Stable.  #Endometrial thickening- unable to do biopsy; secondary to cervical stenosis [Dr.Schermerhorn] stable.  # Left thyroid nodule-stable on PET scan.  Follow up in 6 months/lab-CEA; will call [phone]/daughter- re: PET scan in 6 months.   Addendum: Left a message for the patient's daughter to call us back to discuss elevated tumor marker-up to 129/question repeat imaging.

## 2018-02-24 NOTE — Telephone Encounter (Signed)
Copied from [place] [id number]. Topic: Inquiry >> Feb 24, 2018  3:18 PM Oliver Pila B wrote: Reason for CRM: [facility] OT called b/c the pt's daughter cancelled the last OT visit b/c they felt it wasn't needed, contact [phone] if needed

## 2018-02-24 NOTE — Telephone Encounter (Signed)
Nothing further needed 

## 2018-02-24 NOTE — Telephone Encounter (Signed)
Done, will give to MYD as soon I as get to office this afternoon

## 2018-02-25 LAB — CEA: CEA1: 129 ng/mL — AB (ref 0.0–4.7)

## 2018-02-26 LAB — CALCITONIN

## 2018-02-27 DIAGNOSIS — I2510 Atherosclerotic heart disease of native coronary artery without angina pectoris: Secondary | ICD-10-CM | POA: Diagnosis not present

## 2018-02-27 DIAGNOSIS — Z85028 Personal history of other malignant neoplasm of stomach: Secondary | ICD-10-CM | POA: Diagnosis not present

## 2018-02-27 DIAGNOSIS — N189 Chronic kidney disease, unspecified: Secondary | ICD-10-CM | POA: Diagnosis not present

## 2018-02-27 DIAGNOSIS — I130 Hypertensive heart and chronic kidney disease with heart failure and stage 1 through stage 4 chronic kidney disease, or unspecified chronic kidney disease: Secondary | ICD-10-CM | POA: Diagnosis not present

## 2018-02-27 DIAGNOSIS — I509 Heart failure, unspecified: Secondary | ICD-10-CM | POA: Diagnosis not present

## 2018-02-27 DIAGNOSIS — K5731 Diverticulosis of large intestine without perforation or abscess with bleeding: Secondary | ICD-10-CM | POA: Diagnosis not present

## 2018-02-28 ENCOUNTER — Telehealth: Payer: Self-pay | Admitting: Internal Medicine

## 2018-02-28 NOTE — Telephone Encounter (Signed)
Patient's daughter notified of these results and verbalized understanding.   

## 2018-02-28 NOTE — Telephone Encounter (Signed)
Please inform patient's daughter that the CEA continues to be elevated; most recently elevated at 129.

## 2018-03-01 DIAGNOSIS — I509 Heart failure, unspecified: Secondary | ICD-10-CM | POA: Diagnosis not present

## 2018-03-01 DIAGNOSIS — I2510 Atherosclerotic heart disease of native coronary artery without angina pectoris: Secondary | ICD-10-CM | POA: Diagnosis not present

## 2018-03-01 DIAGNOSIS — N189 Chronic kidney disease, unspecified: Secondary | ICD-10-CM | POA: Diagnosis not present

## 2018-03-01 DIAGNOSIS — Z85028 Personal history of other malignant neoplasm of stomach: Secondary | ICD-10-CM | POA: Diagnosis not present

## 2018-03-01 DIAGNOSIS — I130 Hypertensive heart and chronic kidney disease with heart failure and stage 1 through stage 4 chronic kidney disease, or unspecified chronic kidney disease: Secondary | ICD-10-CM | POA: Diagnosis not present

## 2018-03-01 DIAGNOSIS — K5731 Diverticulosis of large intestine without perforation or abscess with bleeding: Secondary | ICD-10-CM | POA: Diagnosis not present

## 2018-03-10 ENCOUNTER — Other Ambulatory Visit: Payer: Self-pay | Admitting: Cardiovascular Disease

## 2018-03-10 DIAGNOSIS — R9389 Abnormal findings on diagnostic imaging of other specified body structures: Secondary | ICD-10-CM | POA: Insufficient documentation

## 2018-03-13 ENCOUNTER — Ambulatory Visit (INDEPENDENT_AMBULATORY_CARE_PROVIDER_SITE_OTHER): Payer: Medicare Other | Admitting: Gastroenterology

## 2018-03-13 ENCOUNTER — Encounter: Payer: Self-pay | Admitting: Gastroenterology

## 2018-03-13 VITALS — BP 140/69 | HR 76 | Ht 63.0 in | Wt 206.6 lb

## 2018-03-13 DIAGNOSIS — K5731 Diverticulosis of large intestine without perforation or abscess with bleeding: Secondary | ICD-10-CM | POA: Diagnosis not present

## 2018-03-13 DIAGNOSIS — K3189 Other diseases of stomach and duodenum: Secondary | ICD-10-CM | POA: Diagnosis not present

## 2018-03-13 NOTE — Progress Notes (Signed)
Vonda Antigua, MD [address]  [place]  [place], [place] [address]  Main: [phone]  Fax: [phone]   Primary Care Physician: Jearld Fenton, NP  Primary Gastroenterologist:  Dr. Vonda Antigua  Chief Complaint  Patient presents with  . New Patient (Initial Visit)    f/u from ED on 01/31/2018 for diverticulosis, rectal bleed    HPI: Jill Bowers is a 84 y.o. female here for post hospitalization follow up.  Patient admitted in August 2019 for hematochezia, and underwent EGD and colonoscopy.  Colonoscopy showed diverticuli in the sigmoid colon and descending colon, with no active bleeding.  However, stool was also partially covered with old blood and then colon were seen, suggestive of diverticular bleeding.  EGD showed single 5 mm mucosal nodule in the second portion of the duodenum.  This was thought to be suction artifact, and was unable to be located again for biopsies despite multiple sweeps of the area.  Otherwise EGD was normal.  Patient has been taking MiraLAX daily since hospital discharge and reports 1-2 soft bowel movements a day without straining.  Patient has had one other episode of diverticular bleeding in 2018.  Patient reports chronic history of abdominal discomfort, diffusely, not related to meals.  Previous history: History of colon cancer 2016, underwent surgery for this in [place] at the time.As per Dr. Verlin Grills February 2018 consult note, lymph nodes were positive and she was offered chemo but patient decided not to undergo chemo at that time.   Patient sees Dr. Rogue Bussing of oncology for history of colon cancer.  He is following the patient with repeat PET scans and lab work.  Current Outpatient Medications  Medication Sig Dispense Refill  . acetaminophen (TYLENOL) 650 MG CR tablet Take 650 mg by mouth every 8 (eight) hours as needed for pain.    . Cholecalciferol (VITAMIN D-3) 1000 UNITS CAPS Take 2 capsules by mouth daily.    .  COMBIGAN 0.2-0.5 % ophthalmic solution Place 1 drop into both eyes 2 (two) times daily.  4  . furosemide (LASIX) 40 MG tablet TAKE 1 TABLET BY MOUTH EVERY DAY 90 tablet 0  . Magnesium Oxide 500 MG TABS Take 1 tablet by mouth 2 (two) times daily.    Marland Kitchen MEGARED OMEGA-3 KRILL OIL 500 MG CAPS Take by mouth.    Marland Kitchen omeprazole (PRILOSEC) 20 MG capsule Take 20 mg by mouth daily.    Marland Kitchen oxyCODONE-acetaminophen (PERCOCET) 10-325 MG tablet Take 1 tablet by mouth every 8 (eight) hours as needed for pain. 30 tablet 0  . Polyethylene Glycol 3350 (MIRALAX PO) Take by mouth daily.    Marland Kitchen spironolactone (ALDACTONE) 25 MG tablet TAKE 1 TABLET (25 MG TOTAL) BY MOUTH 2 (TWO) TIMES DAILY AS NEEDED. 180 tablet 0  . vitamin C (ASCORBIC ACID) 500 MG tablet Take 500 mg by mouth daily.     No current facility-administered medications for this visit.     Allergies as of 03/13/2018 - Review Complete 03/13/2018  Allergen Reaction Noted  . Gabapentin Other (See Comments) 09/16/2016  . Reglan [metoclopramide] Other (See Comments) 05/16/2015  . Statins Other (See Comments) 05/16/2015  . Torsemide Other (See Comments) 05/16/2015    ROS:  General: Negative for anorexia, weight loss, fever, chills, fatigue, weakness. ENT: Negative for hoarseness, difficulty swallowing , nasal congestion. CV: Negative for chest pain, angina, palpitations, dyspnea on exertion, peripheral edema.  Respiratory: Negative for dyspnea at rest, dyspnea on exertion, cough, sputum, wheezing.  GI: See history  of present illness. GU:  Negative for dysuria, hematuria, urinary incontinence, urinary frequency, nocturnal urination.  Endo: Negative for unusual weight change.    Physical Examination:   BP 140/69   Pulse 76   Ht 5\' 3"  (1.6 m)   Wt 206 lb 9.6 oz (93.7 kg)   BMI 36.60 kg/m   General: Well-nourished, well-developed in no acute distress.  Eyes: No icterus. Conjunctivae pink. Mouth: Oropharyngeal mucosa moist and pink , no lesions erythema  or exudate. Neck: Supple, Trachea midline Abdomen: Bowel sounds are normal, nontender, nondistended, no hepatosplenomegaly or masses, no abdominal bruits or hernia , no rebound or guarding.   Extremities: No lower extremity edema. No clubbing or deformities. Neuro: Alert and oriented x 3.  Grossly intact. Skin: Warm and dry, no jaundice.   Psych: Alert and cooperative, normal mood and affect.   Labs: CMP     Component Value Date/Time   NA 138 02/24/2018 1420   NA 141 09/11/2015 0920   K 4.0 02/24/2018 1420   CL 101 02/24/2018 1420   CO2 28 02/24/2018 1420   GLUCOSE 126 (H) 02/24/2018 1420   BUN 17 02/24/2018 1420   BUN 25 09/11/2015 0920   CREATININE 1.11 (H) 02/24/2018 1420   CALCIUM 9.5 02/24/2018 1420   PROT 7.1 02/24/2018 1420   ALBUMIN 3.7 02/24/2018 1420   AST 16 02/24/2018 1420   ALT 9 02/24/2018 1420   ALKPHOS 72 02/24/2018 1420   BILITOT 0.4 02/24/2018 1420   GFRNONAA 44 (L) 02/24/2018 1420   GFRAA 51 (L) 02/24/2018 1420   Lab Results  Component Value Date   WBC 5.2 02/24/2018   HGB 11.5 (L) 02/24/2018   HCT 35.2 02/24/2018   MCV 89.2 02/24/2018   PLT 306 02/24/2018    Imaging Studies: No results found.  Assessment and Plan:   Jill Bowers is a 84 y.o. y/o female here for posthospitalization follow-up for hematochezia due to diverticular bleeding  Continue high-fiber diet MiraLAX daily Goal of 1-2 soft bowel movements a day, which patient is at goal with MiraLAX daily at this time  Continue follow-up with Dr. Rogue Bussing for history of colon cancer EGD finding of duodenal nodule, which could be suction artifact versus benign lipoma discussed in detail Options of repeat EGD with biopsies, versus no further endoscopy discussed in detail. Patient family would like to proceed with EGD for repeat evaluation  At the time of EGD can also obtain stomach biopsies to rule out H. pylori given patient's chronic abdominal discomfort However, no weight loss,  hematochezia, melena, nausea or vomiting, are reassuring  I have discussed alternative options, risks & benefits,  which include, but are not limited to, bleeding, infection, perforation,respiratory complication & drug reaction.  The patient agrees with this plan & written consent will be obtained.     Dr Vonda Antigua

## 2018-03-14 ENCOUNTER — Other Ambulatory Visit: Payer: Self-pay | Admitting: Internal Medicine

## 2018-03-14 MED ORDER — OXYCODONE-ACETAMINOPHEN 10-325 MG PO TABS: 1.0000 | ORAL_TABLET | Freq: Three times a day (TID) | ORAL | 0 refills | Status: DC | PRN

## 2018-03-14 NOTE — Telephone Encounter (Signed)
Copied from [place] [id number]. Topic: General - Other >> Mar 14, 2018  8:40 AM [name] wrote: Reason for CRM:pt daughter norma is calling and the pharm told her to call her mother  doctor. Pt needs new rx oxycodone. [facility]

## 2018-03-14 NOTE — Telephone Encounter (Signed)
Name of Medication: oxycodone apap 10-325 mg Name of Pharmacy: [facility] or Written Date and Quantity: # 30 on 02/14/18 Last Office Visit and Type: 02/14/18 annual Next Office Visit and Type: none scheduled Last Controlled Substance Agreement Date: 08/09/17 Last UDS:08/09/17

## 2018-03-15 ENCOUNTER — Other Ambulatory Visit: Payer: Self-pay

## 2018-03-15 DIAGNOSIS — K3189 Other diseases of stomach and duodenum: Secondary | ICD-10-CM

## 2018-03-26 ENCOUNTER — Other Ambulatory Visit: Payer: Self-pay | Admitting: Cardiovascular Disease

## 2018-03-29 ENCOUNTER — Other Ambulatory Visit: Payer: Self-pay

## 2018-03-29 ENCOUNTER — Ambulatory Visit
Admission: RE | Admit: 2018-03-29 | Discharge: 2018-03-29 | Disposition: A | Discharge status: Home / Self Care | Payer: Medicare Other | Source: Ambulatory Visit | Attending: Gastroenterology | Admitting: Gastroenterology

## 2018-03-29 ENCOUNTER — Ambulatory Visit: Payer: Medicare Other | Admitting: Anesthesiology

## 2018-03-29 ENCOUNTER — Encounter
Admission: RE | Disposition: A | Discharge status: Home / Self Care | Payer: Self-pay | Source: Ambulatory Visit | Attending: Gastroenterology

## 2018-03-29 DIAGNOSIS — Z86718 Personal history of other venous thrombosis and embolism: Secondary | ICD-10-CM | POA: Insufficient documentation

## 2018-03-29 DIAGNOSIS — K219 Gastro-esophageal reflux disease without esophagitis: Secondary | ICD-10-CM | POA: Insufficient documentation

## 2018-03-29 DIAGNOSIS — K317 Polyp of stomach and duodenum: Secondary | ICD-10-CM | POA: Diagnosis not present

## 2018-03-29 DIAGNOSIS — R1084 Generalized abdominal pain: Secondary | ICD-10-CM

## 2018-03-29 DIAGNOSIS — I2510 Atherosclerotic heart disease of native coronary artery without angina pectoris: Secondary | ICD-10-CM | POA: Diagnosis not present

## 2018-03-29 DIAGNOSIS — Z85028 Personal history of other malignant neoplasm of stomach: Secondary | ICD-10-CM | POA: Insufficient documentation

## 2018-03-29 DIAGNOSIS — I110 Hypertensive heart disease with heart failure: Secondary | ICD-10-CM | POA: Diagnosis not present

## 2018-03-29 DIAGNOSIS — I130 Hypertensive heart and chronic kidney disease with heart failure and stage 1 through stage 4 chronic kidney disease, or unspecified chronic kidney disease: Secondary | ICD-10-CM | POA: Diagnosis not present

## 2018-03-29 DIAGNOSIS — I509 Heart failure, unspecified: Secondary | ICD-10-CM | POA: Insufficient documentation

## 2018-03-29 DIAGNOSIS — I252 Old myocardial infarction: Secondary | ICD-10-CM | POA: Insufficient documentation

## 2018-03-29 DIAGNOSIS — Z86711 Personal history of pulmonary embolism: Secondary | ICD-10-CM | POA: Diagnosis not present

## 2018-03-29 DIAGNOSIS — N183 Chronic kidney disease, stage 3 (moderate): Secondary | ICD-10-CM | POA: Diagnosis not present

## 2018-03-29 DIAGNOSIS — K3189 Other diseases of stomach and duodenum: Secondary | ICD-10-CM | POA: Diagnosis not present

## 2018-03-29 HISTORY — DX: Adverse effect of unspecified anesthetic, initial encounter: T41.45XA

## 2018-03-29 HISTORY — DX: Other complications of anesthesia, initial encounter: T88.59XA

## 2018-03-29 HISTORY — PX: ESOPHAGOGASTRODUODENOSCOPY (EGD) WITH PROPOFOL: SHX5813

## 2018-03-29 SURGERY — ESOPHAGOGASTRODUODENOSCOPY (EGD) WITH PROPOFOL
Anesthesia: General

## 2018-03-29 MED ORDER — SODIUM CHLORIDE 0.9 % IV SOLN
INTRAVENOUS | Status: DC
  Administered 2018-03-29: 09:00:00 via INTRAVENOUS

## 2018-03-29 MED ORDER — FENTANYL CITRATE (PF) 100 MCG/2ML IJ SOLN
INTRAMUSCULAR | Status: DC | PRN
  Administered 2018-03-29: 50 ug via INTRAVENOUS

## 2018-03-29 MED ORDER — PROPOFOL 500 MG/50ML IV EMUL
INTRAVENOUS | Status: DC | PRN
  Administered 2018-03-29: 100 ug/kg/min via INTRAVENOUS

## 2018-03-29 MED ORDER — PROPOFOL 10 MG/ML IV BOLUS
INTRAVENOUS | Status: AC
  Filled 2018-03-29: qty 20

## 2018-03-29 MED ORDER — PROPOFOL 10 MG/ML IV BOLUS
INTRAVENOUS | Status: DC | PRN
  Administered 2018-03-29: 60 mg via INTRAVENOUS

## 2018-03-29 MED ORDER — FENTANYL CITRATE (PF) 100 MCG/2ML IJ SOLN
INTRAMUSCULAR | Status: AC
  Filled 2018-03-29: qty 2

## 2018-03-29 MED ORDER — LIDOCAINE 2% (20 MG/ML) 5 ML SYRINGE
INTRAMUSCULAR | Status: DC | PRN
  Administered 2018-03-29: 30 mg via INTRAVENOUS

## 2018-03-29 NOTE — Anesthesia Preprocedure Evaluation (Signed)
Anesthesia Evaluation  Patient identified by MRN, date of birth, ID band Patient awake    Reviewed: Allergy & Precautions, NPO status , Patient's Chart, lab work & pertinent test results  History of Anesthesia Complications (+) PROLONGED EMERGENCE and history of anesthetic complications  Airway Mallampati: III  TM Distance: >3 FB Neck ROM: Full    Dental  (+) Edentulous Upper, Edentulous Lower   Pulmonary neg pulmonary ROS, neg sleep apnea, neg COPD,    breath sounds clear to auscultation- rhonchi (-) wheezing      Cardiovascular Exercise Tolerance: Poor hypertension, + CAD, + Past MI (medical management) and +CHF (diastolic)  (-) Cardiac Stents and (-) CABG  Rhythm:Regular Rate:Normal - Systolic murmurs and - Diastolic murmurs Echo 08/26/49: - Left ventricle: The cavity size was normal. Wall thickness was   increased in a pattern of moderate LVH. Systolic function was   vigorous. The estimated ejection fraction was in the range of 65%   to 70%. Doppler parameters are consistent with abnormal left   ventricular relaxation (grade 1 diastolic dysfunction). - Mitral valve: There was mild regurgitation. - Left atrium: The atrium was mildly dilated. - Right ventricle: The cavity size was normal. Right ventricle   appears hypertrophied, particularly near the apex. Systolic   function was normal.    Neuro/Psych negative neurological ROS  negative psych ROS   GI/Hepatic Neg liver ROS, GERD  ,  Endo/Other  negative endocrine ROSneg diabetes  Renal/GU Renal InsufficiencyRenal disease     Musculoskeletal  (+) Arthritis ,   Abdominal (+) + obese,   Peds  Hematology negative hematology ROS (+)   Anesthesia Other Findings Past Medical History: No date: CHF (congestive heart failure) (HCC) No date: Colon cancer ([place]) No date: Complication of anesthesia     Comment:  difficulty waking up afcter colon surgery No date:  Coronary artery disease No date: DVT (deep venous thrombosis) (HCC) No date: GERD (gastroesophageal reflux disease) No date: Hypertension No date: Malignant neoplasm of stomach (HCC) No date: PE (pulmonary embolism) No date: Renal disorder No date: Renal insufficiency   Reproductive/Obstetrics                             Anesthesia Physical Anesthesia Plan  ASA: III  Anesthesia Plan: General   Post-op Pain Management:    Induction: Intravenous  PONV Risk Score and Plan: 2 and Propofol infusion  Airway Management Planned: Natural Airway  Additional Equipment:   Intra-op Plan:   Post-operative Plan:   Informed Consent: I have reviewed the patients History and Physical, chart, labs and discussed the procedure including the risks, benefits and alternatives for the proposed anesthesia with the patient or authorized representative who has indicated his/her understanding and acceptance.   Dental advisory given  Plan Discussed with: CRNA and Anesthesiologist  Anesthesia Plan Comments:         Anesthesia Quick Evaluation

## 2018-03-29 NOTE — Op Note (Signed)
[facility] Patient Name: Jill Bowers Procedure Date: 03/29/2018 9:22 AM MRN: [id number] Account #: [id number] Date of Birth: [birth year] Admit Type: Outpatient Age: 84 Room: [facility] ENDO ROOM 3 Gender: Female Note Status: Finalized Procedure:            Upper GI endoscopy Indications:          Generalized abdominal pain, Follow up for duodenal                        lesion seen on last EGD. Providers:            Lennette Bihari. Bonna Gains MD, MD Referring MD:         Jearld Fenton (Referring MD) Medicines:            Monitored Anesthesia Care Complications:        No immediate complications. Procedure:            Pre-Anesthesia Assessment:                       - The risks and benefits of the procedure and the                        sedation options and risks were discussed with the                        patient. All questions were answered and informed                        consent was obtained.                       - Patient identification and proposed procedure were                        verified prior to the procedure.                       - ASA Grade Assessment: III - A patient with severe                        systemic disease.                       After obtaining informed consent, the endoscope was                        passed under direct vision. Throughout the procedure,                        the patient's blood pressure, pulse, and oxygen                        saturations were monitored continuously. The Endoscope                        was introduced through the mouth, and advanced to the                        third part of duodenum. The upper GI endoscopy was  accomplished with ease. The patient tolerated the                        procedure well. Findings:      The examined esophagus was normal.      The Z-line was regular and was found 35 cm from the incisors.      Patchy mild mucosal changes  characterized by a decreased vascular       pattern were found in the entire examined stomach. Biopsies were taken       with a cold forceps for histology. Biopsies were obtained in the gastric       body, at the incisura and in the gastric antrum with cold forceps for       histology.      The duodenal bulb was normal.      A single 5 mm sessile polyp with no bleeding was found in the second       portion of the duodenum. Biopsies were taken with a cold forceps for       histology. This was noted across from the ampulla as visible on the       images. Due to its position, it could not be completely visualized (but       was seen on last EGD - see previous EGD report), but was able to be       biopsied. Impression:           - Normal esophagus.                       - Z-line regular, 35 cm from the incisors.                       - Decreased vascular pattern mucosa in the stomach.                        Biopsied.                       - Normal duodenal bulb.                       - A single duodenal polyp. Biopsied.                       - Biopsies were obtained in the gastric body, at the                        incisura and in the gastric antrum. Recommendation:       - Await pathology results.                       - If the duodenal lesion/polyp shows adenoma, patient                        will need to be referred for removal of the polyp.                       - Resume previous diet.                       - Continue present medications.                       -  The findings and recommendations were discussed with                        the patient.                       - The findings and recommendations were discussed with                        the patient's family.                       - Return to primary care physician as previously                        scheduled. Procedure Code(s):    --- Professional ---                       [phone], Esophagogastroduodenoscopy, flexible,  transoral;                        with biopsy, single or multiple Diagnosis Code(s):    --- Professional ---                       K31.89, Other diseases of stomach and duodenum                       K31.7, Polyp of stomach and duodenum                       R10.84, Generalized abdominal pain CPT copyright 2017 American Medical Association. All rights reserved. The codes documented in this report are preliminary and upon coder review may  be revised to meet current compliance requirements.  Vonda Antigua, MD Margretta Sidle B. Bonna Gains MD, MD 03/29/2018 10:13:48 AM This report has been signed electronically. Number of Addenda: 0 Note Initiated On: 03/29/2018 9:22 AM Estimated Blood Loss: Estimated blood loss: none.      [facility]

## 2018-03-29 NOTE — Anesthesia Postprocedure Evaluation (Signed)
Anesthesia Post Note  Patient: Shalin Omalley  Procedure(s) Performed: ESOPHAGOGASTRODUODENOSCOPY (EGD) WITH PROPOFOL (N/A )  Patient location during evaluation: Endoscopy Anesthesia Type: General Level of consciousness: awake and alert and oriented Pain management: pain level controlled Vital Signs Assessment: post-procedure vital signs reviewed and stable Respiratory status: spontaneous breathing, nonlabored ventilation and respiratory function stable Cardiovascular status: blood pressure returned to baseline and stable Postop Assessment: no signs of nausea or vomiting Anesthetic complications: no     Last Vitals:  Vitals:   03/29/18 1017 03/29/18 1027  BP: (!) 106/56 125/68  Pulse:    Resp:    Temp:    SpO2:      Last Pain:  Vitals:   03/29/18 1037  TempSrc:   PainSc: 0-No pain                 Leeon Makar

## 2018-03-29 NOTE — H&P (Signed)
Jill Antigua, MD [address] [place], [place], [place], [address] [address], [place], [place], [place], [address] Phone: [phone]  Fax: [phone]  Primary Care Physician:  Jearld Fenton, NP   Pre-Procedure History & Physical: HPI:  Jill Bowers is a 84 y.o. female is here for an EGD.   Past Medical History:  Diagnosis Date  . CHF (congestive heart failure) ([place])   . Colon cancer ([place])   . Complication of anesthesia    difficulty waking up afcter colon surgery  . Coronary artery disease   . DVT (deep venous thrombosis) ([place])   . GERD (gastroesophageal reflux disease)   . Hypertension   . Malignant neoplasm of stomach ([place])   . PE (pulmonary embolism)   . Renal disorder   . Renal insufficiency     Past Surgical History:  Procedure Laterality Date  . ABDOMINAL HYSTERECTOMY    . COLON SURGERY    . COLONOSCOPY N/A 02/01/2018   Procedure: COLONOSCOPY;  Surgeon: Virgel Manifold, MD;  Location: [facility] ENDOSCOPY;  Service: Endoscopy;  Laterality: N/A;  . COLONOSCOPY WITH PROPOFOL N/A 08/23/2016   Procedure: COLONOSCOPY WITH PROPOFOL;  Surgeon: Jonathon Bellows, MD;  Location: [facility] ENDOSCOPY;  Service: Gastroenterology;  Laterality: N/A;  . COLONOSCOPY WITH PROPOFOL N/A 08/24/2016   Procedure: COLONOSCOPY WITH PROPOFOL;  Surgeon: Jonathon Bellows, MD;  Location: [facility] ENDOSCOPY;  Service: Endoscopy;  Laterality: N/A;  . ESOPHAGOGASTRODUODENOSCOPY N/A 02/01/2018   Procedure: ESOPHAGOGASTRODUODENOSCOPY (EGD);  Surgeon: Virgel Manifold, MD;  Location: [facility] ENDOSCOPY;  Service: Endoscopy;  Laterality: N/A;  . IVC FILTER PLACEMENT ([place] HX)    . JOINT REPLACEMENT Right 2000   knee    Prior to Admission medications   Medication Sig Start Date End Date Taking? Authorizing Provider  acetaminophen (TYLENOL) 650 MG CR tablet Take 650 mg by mouth every 8 (eight) hours as needed for pain.   Yes [provider]  Cholecalciferol (VITAMIN D-3) 1000 UNITS CAPS Take 2  capsules by mouth daily.   Yes [provider]  furosemide (LASIX) 40 MG tablet TAKE 1 TABLET BY MOUTH EVERY DAY 03/10/18  Yes Gollan, Kathlene November, MD  Magnesium Oxide 500 MG TABS Take 1 tablet by mouth 2 (two) times daily.   Yes [provider]  MEGARED OMEGA-3 KRILL OIL 500 MG CAPS Take by mouth.   Yes [provider]  omeprazole (PRILOSEC) 20 MG capsule Take 20 mg by mouth daily.   Yes [provider]  oxyCODONE-acetaminophen (PERCOCET) 10-325 MG tablet Take 1 tablet by mouth every 8 (eight) hours as needed for pain. 03/14/18  Yes Baity, Coralie Keens, NP  Polyethylene Glycol 3350 (MIRALAX PO) Take by mouth daily.   Yes [provider]  spironolactone (ALDACTONE) 25 MG tablet TAKE 1 TABLET (25 MG TOTAL) BY MOUTH 2 (TWO) TIMES DAILY AS NEEDED. 03/27/18  Yes Gollan, Kathlene November, MD  vitamin C (ASCORBIC ACID) 500 MG tablet Take 500 mg by mouth daily.   Yes [provider]  COMBIGAN 0.2-0.5 % ophthalmic solution Place 1 drop into both eyes 2 (two) times daily. 11/30/17   [provider]    Allergies as of 03/15/2018 - Review Complete 03/13/2018  Allergen Reaction Noted  . Gabapentin Other (See Comments) 09/16/2016  . Reglan [metoclopramide] Other (See Comments) 05/16/2015  . Statins Other (See Comments) 05/16/2015  . Torsemide Other (See Comments) 05/16/2015    Family History  Problem Relation Age of Onset  . Arthritis Mother   . Hypertension  Mother   . Colon cancer Sister   . Heart disease Daughter   . Colon cancer Son   . Pancreatic cancer Son     Social History   Socioeconomic History  . Marital status: Widowed    Spouse name: Not on file  . Number of children: Not on file  . Years of education: Not on file  . Highest education level: Not on file  Occupational History  . Not on file  Social Needs  . Financial resource strain: Not on file  . Food insecurity:    Worry: Not on file    Inability: Not on file  . Transportation  needs:    Medical: Not on file    Non-medical: Not on file  Tobacco Use  . Smoking status: Never Smoker  . Smokeless tobacco: Never Used  Substance and Sexual Activity  . Alcohol use: No  . Drug use: No  . Sexual activity: Never  Lifestyle  . Physical activity:    Days per week: Not on file    Minutes per session: Not on file  . Stress: Not on file  Relationships  . Social connections:    Talks on phone: Not on file    Gets together: Not on file    Attends religious service: Not on file    Active member of club or organization: Not on file    Attends meetings of clubs or organizations: Not on file    Relationship status: Not on file  . Intimate partner violence:    Fear of current or ex partner: Not on file    Emotionally abused: Not on file    Physically abused: Not on file    Forced sexual activity: Not on file  Other Topics Concern  . Not on file  Social History Narrative   Lives at home with daughter, has a walker    Review of Systems: See HPI, otherwise negative ROS  Physical Exam: BP (!) 146/58   Pulse 73   Temp (!) 97 F (36.1 C) (Tympanic)   Resp 18   Ht 5\' 3"  (1.6 m)   Wt 93.4 kg   SpO2 96%   BMI 36.49 kg/m  General:   Alert,  pleasant and cooperative in NAD Head:  Normocephalic and atraumatic. Neck:  Supple; no masses or thyromegaly. Lungs:  Clear throughout to auscultation, normal respiratory effort.    Heart:  +S1, +S2, Regular rate and rhythm, No edema. Abdomen:  Soft, nontender and nondistended. Normal bowel sounds, without guarding, and without rebound.   Neurologic:  Alert and  oriented x4;  grossly normal neurologically.  Impression/Plan: Jill Bowers is here for an EGD for abdominal discomfort.   Risks, benefits, limitations, and alternatives regarding the procedure have been reviewed with the patient.  Questions have been answered.  All parties agreeable.   Virgel Manifold, MD  03/29/2018, 9:29 AM

## 2018-03-29 NOTE — Anesthesia Post-op Follow-up Note (Signed)
Anesthesia QCDR form completed.        

## 2018-03-29 NOTE — Transfer of Care (Signed)
Immediate Anesthesia Transfer of Care Note  Patient: Jill Bowers  Procedure(s) Performed: ESOPHAGOGASTRODUODENOSCOPY (EGD) WITH PROPOFOL (N/A )  Patient Location: PACU and Endoscopy Unit  Anesthesia Type:General  Level of Consciousness: awake  Airway & Oxygen Therapy: Patient Spontanous Breathing and Patient connected to nasal cannula oxygen  Post-op Assessment: Report given to RN and Post -op Vital signs reviewed and stable  Post vital signs: Reviewed and stable  Last Vitals:  Vitals Value Taken Time  BP    Temp    Pulse 66 03/29/2018 10:09 AM  Resp 21 03/29/2018 10:09 AM  SpO2 96 % 03/29/2018 10:09 AM  Vitals shown include unvalidated device data.  Last Pain:  Vitals:   03/29/18 1007  TempSrc: (P) Tympanic  PainSc:          Complications: No apparent anesthesia complications

## 2018-03-30 ENCOUNTER — Encounter: Payer: Self-pay | Admitting: Gastroenterology

## 2018-04-03 LAB — SURGICAL PATHOLOGY

## 2018-04-06 ENCOUNTER — Ambulatory Visit: Payer: Medicare Other

## 2018-04-12 ENCOUNTER — Telehealth: Payer: Self-pay

## 2018-04-12 NOTE — Telephone Encounter (Signed)
Pt's daughter notified of results.  

## 2018-04-12 NOTE — Telephone Encounter (Signed)
-----   Message from Virgel Manifold, MD sent at 04/07/2018  3:18 PM EDT ----- Jill Bowers please let patient know, her biopsies were benign. Follow up as needed.

## 2018-04-20 ENCOUNTER — Ambulatory Visit (INDEPENDENT_AMBULATORY_CARE_PROVIDER_SITE_OTHER): Payer: Medicare Other

## 2018-04-20 DIAGNOSIS — Z23 Encounter for immunization: Secondary | ICD-10-CM | POA: Diagnosis not present

## 2018-06-01 ENCOUNTER — Other Ambulatory Visit: Payer: Self-pay

## 2018-06-01 MED ORDER — OXYCODONE-ACETAMINOPHEN 10-325 MG PO TABS: 1.0000 | ORAL_TABLET | Freq: Three times a day (TID) | ORAL | 0 refills | Status: DC | PRN

## 2018-06-01 NOTE — Telephone Encounter (Signed)
Name of Medication: oxycodone apap 10-325 mg Name of Pharmacy: [facility] or Written Date and Quantity: # 51 on 03/14/18 Last Office Visit and Type: 02/14/18 annual  Next Office Visit and Type: none scheduled Last Controlled Substance Agreement Date:08/09/17  Last UDS:08/09/17

## 2018-06-06 ENCOUNTER — Other Ambulatory Visit: Payer: Self-pay | Admitting: Cardiovascular Disease

## 2018-08-23 ENCOUNTER — Other Ambulatory Visit: Payer: Self-pay

## 2018-08-23 MED ORDER — OXYCODONE-ACETAMINOPHEN 10-325 MG PO TABS: 1.0000 | ORAL_TABLET | Freq: Three times a day (TID) | ORAL | 0 refills | Status: DC | PRN

## 2018-08-23 NOTE — Telephone Encounter (Signed)
Last filled 06/01/2018... please advise

## 2018-08-25 ENCOUNTER — Inpatient Hospital Stay: Payer: Medicare Other | Admitting: Internal Medicine

## 2018-08-25 ENCOUNTER — Inpatient Hospital Stay: Payer: Medicare Other

## 2018-09-05 ENCOUNTER — Inpatient Hospital Stay (HOSPITAL_BASED_OUTPATIENT_CLINIC_OR_DEPARTMENT_OTHER): Payer: Medicare Other | Admitting: Internal Medicine

## 2018-09-05 ENCOUNTER — Ambulatory Visit
Admission: RE | Admit: 2018-09-05 | Discharge: 2018-09-05 | Disposition: A | Discharge status: Home / Self Care | Payer: Medicare Other | Source: Ambulatory Visit | Attending: Internal Medicine | Admitting: Internal Medicine

## 2018-09-05 ENCOUNTER — Inpatient Hospital Stay: Payer: Medicare Other | Attending: Internal Medicine

## 2018-09-05 ENCOUNTER — Encounter: Payer: Self-pay | Admitting: Internal Medicine

## 2018-09-05 ENCOUNTER — Other Ambulatory Visit: Payer: Self-pay

## 2018-09-05 ENCOUNTER — Telehealth: Payer: Self-pay | Admitting: Internal Medicine

## 2018-09-05 VITALS — BP 155/84 | HR 69 | Temp 98.1°F | Resp 15

## 2018-09-05 DIAGNOSIS — R21 Rash and other nonspecific skin eruption: Secondary | ICD-10-CM

## 2018-09-05 DIAGNOSIS — K5900 Constipation, unspecified: Secondary | ICD-10-CM | POA: Diagnosis not present

## 2018-09-05 DIAGNOSIS — C772 Secondary and unspecified malignant neoplasm of intra-abdominal lymph nodes: Secondary | ICD-10-CM | POA: Diagnosis present

## 2018-09-05 DIAGNOSIS — M7989 Other specified soft tissue disorders: Secondary | ICD-10-CM | POA: Insufficient documentation

## 2018-09-05 DIAGNOSIS — R109 Unspecified abdominal pain: Secondary | ICD-10-CM | POA: Diagnosis not present

## 2018-09-05 DIAGNOSIS — G8929 Other chronic pain: Secondary | ICD-10-CM | POA: Diagnosis not present

## 2018-09-05 DIAGNOSIS — Z800 Family history of malignant neoplasm of digestive organs: Secondary | ICD-10-CM | POA: Diagnosis not present

## 2018-09-05 DIAGNOSIS — C189 Malignant neoplasm of colon, unspecified: Secondary | ICD-10-CM

## 2018-09-05 DIAGNOSIS — I82401 Acute embolism and thrombosis of unspecified deep veins of right lower extremity: Secondary | ICD-10-CM | POA: Insufficient documentation

## 2018-09-05 DIAGNOSIS — R9389 Abnormal findings on diagnostic imaging of other specified body structures: Secondary | ICD-10-CM | POA: Insufficient documentation

## 2018-09-05 DIAGNOSIS — Z7901 Long term (current) use of anticoagulants: Secondary | ICD-10-CM | POA: Insufficient documentation

## 2018-09-05 LAB — CBC WITH DIFFERENTIAL/PLATELET
Abs Immature Granulocytes: 0.01 10*3/uL (ref 0.00–0.07)
Basophils Absolute: 0 10*3/uL (ref 0.0–0.1)
Basophils Relative: 0 %
EOS ABS: 0.1 10*3/uL (ref 0.0–0.5)
EOS PCT: 3 %
HEMATOCRIT: 38.8 % (ref 36.0–46.0)
HEMOGLOBIN: 12.2 g/dL (ref 12.0–15.0)
Immature Granulocytes: 0 %
LYMPHS ABS: 1.8 10*3/uL (ref 0.7–4.0)
LYMPHS PCT: 37 %
MCH: 28.1 pg (ref 26.0–34.0)
MCHC: 31.4 g/dL (ref 30.0–36.0)
MCV: 89.4 fL (ref 80.0–100.0)
MONO ABS: 0.5 10*3/uL (ref 0.1–1.0)
MONOS PCT: 10 %
NRBC: 0 % (ref 0.0–0.2)
Neutro Abs: 2.4 10*3/uL (ref 1.7–7.7)
Neutrophils Relative %: 50 %
Platelets: 235 10*3/uL (ref 150–400)
RBC: 4.34 MIL/uL (ref 3.87–5.11)
RDW: 14.4 % (ref 11.5–15.5)
WBC: 4.9 10*3/uL (ref 4.0–10.5)

## 2018-09-05 LAB — COMPREHENSIVE METABOLIC PANEL
ALBUMIN: 4.2 g/dL (ref 3.5–5.0)
ALT: 7 U/L (ref 0–44)
AST: 16 U/L (ref 15–41)
Alkaline Phosphatase: 73 U/L (ref 38–126)
Anion gap: 7 (ref 5–15)
BUN: 16 mg/dL (ref 8–23)
CALCIUM: 9.2 mg/dL (ref 8.9–10.3)
CHLORIDE: 104 mmol/L (ref 98–111)
CO2: 29 mmol/L (ref 22–32)
CREATININE: 1.1 mg/dL — AB (ref 0.44–1.00)
GFR calc non Af Amer: 45 mL/min — ABNORMAL LOW (ref >60)
GFR, EST AFRICAN AMERICAN: 52 mL/min — AB (ref >60)
Glucose, Bld: 151 mg/dL — ABNORMAL HIGH (ref 70–99)
Potassium: 3.6 mmol/L (ref 3.5–5.1)
SODIUM: 140 mmol/L (ref 135–145)
Total Bilirubin: 0.5 mg/dL (ref 0.3–1.2)
Total Protein: 7.3 g/dL (ref 6.5–8.1)

## 2018-09-05 MED ORDER — APIXABAN 5 MG PO TABS: 5.0000 mg | ORAL_TABLET | Freq: Two times a day (BID) | ORAL | 5 refills | Status: AC

## 2018-09-05 NOTE — Telephone Encounter (Signed)
Spoke to Melia and she is aware that Eliquis is standard treatment for DVT and [place] can be done in a few months to recheck. Per Baity VO...daughter expressed understanding

## 2018-09-05 NOTE — Assessment & Plan Note (Addendum)
#   Colon cancer-stage III [2016-pT4N1a] with multiple liver cysts on the CT scan]. No adjuvant therapy [ given multiple comorbidities for performance status].   #CT scan-August 2019- for recurrence.  August 2019-colonoscopy/EGD-negative; however CEA rising-August 2019 130.  CEA from today is pending.  Given the worsening abdominal pain-I suspect patient will require a repeat CT scan to rule out recurrence especially given the new DVT see below.  # Abdominal pain- unclear etiology. Incisional question neuropathic.poor tolerance to gabapentin/amitriptyline; on oxycodone 3 pills a day.  Worse.  See above  #Endometrial thickening- unable to do biopsy; secondary to cervical stenosis [Dr.Schermerhorn] no vaginal bleeding stable.  # skin rash-left elbow fold.;  Recommend antifungal.  #Right lower extremity swelling/pain-stat lower extremity venous Dopplers.  # DISPOSITION: # follow up in 2 weeks-cbc/bmp-dr.B  Addendum: Stat right lower extremity venous Doppler showed extensive DVT involving about the thigh and below the thigh.  Start the patient on Eliquis 5 mg twice daily; voucher was given.  Discussed the mechanism of action/patient precautions in detail.  Await CEA-if rising/elevated will recommend CT scan abdomen pelvis/mostly for prognostic reasons.  # 40 minutes face-to-face with the patient discussing the above plan of care; more than 50% of time spent on prognosis/ natural history; counseling and coordination.  Addendum #2- Spoke to pt's daughter, Freda Munro regarding elevated CEA-recommend a PET scan for further evaluation. PET scan ASAP/follow-up 1 to 2 days later/ no labs.

## 2018-09-05 NOTE — Progress Notes (Signed)
[facility] OFFICE PROGRESS NOTE  Patient Care Team: Jearld Fenton, NP as PCP - General (Internal Medicine)  Cancer Staging No matching staging information was found for the patient.   Oncology History   # July 2016- COLON CANCER; mod diff adeno; STAGE III (pT4aN1a; MSI-STABLE) [s/p surgery;Dr. Arbutus Ped Turner];No adjuvant chemo sec to co-morbdities DEC 2016- CT-C/ A/P [[facility]]- subcentimeter hepatic cysts ; irregular soft tissue density in the peritoneal fat within pelvis ? Scarring  [less likely recurrence];    # EGD/colonoscopy- AUG 2019- NEG s/p 2u PRBC.    # 10th  MARCH 2020- R LE DVT- start eliquis   # left thyroid nodule- No Bx  # endometrial thickening. Biopsy not done because of cervical stenosis [Dr.Schermerhorn].  # July R LE DVT/Bil PE s/p IVC filter; Hx of GIB; AKI; Hx of Severe Anemia; hx CHF   ----------------------------------------------------------------------   DIAGNOSIS: COLON CANCER  STAGE:  III       ;GOALS: curative  CURRENT/MOST RECENT THERAPY : surveillance      Colon cancer metastasized to intra-abdominal lymph node (HCC)      INTERVAL HISTORY:  Jill Bowers 85 y.o.  female pleasant patient above history of colon cancer stage III high risk-2016; no adjuvant therapy secondary to multiple comorbidities with elevated CEA is here for follow-up.  Patient complains of right leg swelling and also pain.  She also complains of chronic abdominal pain which she states is getting worse.  Chronic mild constipation.  No blood in stools or black or stools.  Overall she feels poorly.  Review of Systems  Constitutional: Positive for malaise/fatigue. Negative for chills, diaphoresis, fever and weight loss.  HENT: Negative for nosebleeds and sore throat.   Eyes: Negative for double vision.  Respiratory: Negative for cough, hemoptysis, sputum production, shortness of breath and wheezing.   Cardiovascular: Negative for chest pain,  palpitations, orthopnea and leg swelling.  Gastrointestinal: Positive for abdominal pain (Chronic incisional abdominal pain.). Negative for blood in stool, constipation, diarrhea, heartburn, melena, nausea and vomiting.  Genitourinary: Negative for dysuria, frequency and urgency.  Musculoskeletal: Positive for back pain and joint pain.  Skin: Negative.  Negative for itching and rash.  Neurological: Negative for dizziness, tingling, focal weakness, weakness and headaches.  Endo/Heme/Allergies: Does not bruise/bleed easily.  Psychiatric/Behavioral: Negative for depression. The patient is not nervous/anxious and does not have insomnia.       PAST MEDICAL HISTORY :  Past Medical History:  Diagnosis Date  . CHF (congestive heart failure) ([place])   . Colon cancer ([place])   . Complication of anesthesia    difficulty waking up afcter colon surgery  . Coronary artery disease   . DVT (deep venous thrombosis) ([place])   . GERD (gastroesophageal reflux disease)   . Hypertension   . Malignant neoplasm of stomach ([place])   . PE (pulmonary embolism)   . Renal disorder   . Renal insufficiency     PAST SURGICAL HISTORY :   Past Surgical History:  Procedure Laterality Date  . ABDOMINAL HYSTERECTOMY    . COLON SURGERY    . COLONOSCOPY N/A 02/01/2018   Procedure: COLONOSCOPY;  Surgeon: Virgel Manifold, MD;  Location: [facility] ENDOSCOPY;  Service: Endoscopy;  Laterality: N/A;  . COLONOSCOPY WITH PROPOFOL N/A 08/23/2016   Procedure: COLONOSCOPY WITH PROPOFOL;  Surgeon: Jonathon Bellows, MD;  Location: [facility] ENDOSCOPY;  Service: Gastroenterology;  Laterality: N/A;  . COLONOSCOPY WITH PROPOFOL N/A 08/24/2016   Procedure: COLONOSCOPY WITH PROPOFOL;  Surgeon: Jonathon Bellows,  MD;  Location: [facility] ENDOSCOPY;  Service: Endoscopy;  Laterality: N/A;  . ESOPHAGOGASTRODUODENOSCOPY N/A 02/01/2018   Procedure: ESOPHAGOGASTRODUODENOSCOPY (EGD);  Surgeon: Virgel Manifold, MD;  Location: [facility] ENDOSCOPY;  Service: Endoscopy;   Laterality: N/A;  . ESOPHAGOGASTRODUODENOSCOPY (EGD) WITH PROPOFOL N/A 03/29/2018   Procedure: ESOPHAGOGASTRODUODENOSCOPY (EGD) WITH PROPOFOL;  Surgeon: Virgel Manifold, MD;  Location: [facility] ENDOSCOPY;  Service: Endoscopy;  Laterality: N/A;  . IVC FILTER PLACEMENT ([place] HX)    . JOINT REPLACEMENT Right 2000   knee    FAMILY HISTORY :   Family History  Problem Relation Age of Onset  . Arthritis Mother   . Hypertension Mother   . Colon cancer Sister   . Heart disease Daughter   . Colon cancer Son   . Pancreatic cancer Son     SOCIAL HISTORY:   Social History   Tobacco Use  . Smoking status: Never Smoker  . Smokeless tobacco: Never Used  Substance Use Topics  . Alcohol use: No  . Drug use: No    ALLERGIES:  is allergic to gabapentin; reglan [metoclopramide]; statins; and torsemide.  MEDICATIONS:  Current Outpatient Medications  Medication Sig Dispense Refill  . acetaminophen (TYLENOL) 650 MG CR tablet Take 650 mg by mouth every 8 (eight) hours as needed for pain.    . Cholecalciferol (VITAMIN D-3) 1000 UNITS CAPS Take 2 capsules by mouth daily.    . COMBIGAN 0.2-0.5 % ophthalmic solution Place 1 drop into both eyes 2 (two) times daily.  4  . furosemide (LASIX) 40 MG tablet TAKE 1 TABLET BY MOUTH EVERY DAY 30 tablet 0  . Magnesium Oxide 500 MG TABS Take 1 tablet by mouth 2 (two) times daily.    Marland Kitchen MEGARED OMEGA-3 KRILL OIL 500 MG CAPS Take by mouth.    Marland Kitchen omeprazole (PRILOSEC) 20 MG capsule Take 20 mg by mouth daily.    Marland Kitchen oxyCODONE-acetaminophen (PERCOCET) 10-325 MG tablet Take 1 tablet by mouth every 8 (eight) hours as needed for pain. 90 tablet 0  . Polyethylene Glycol 3350 (MIRALAX PO) Take by mouth daily.    Marland Kitchen spironolactone (ALDACTONE) 25 MG tablet TAKE 1 TABLET (25 MG TOTAL) BY MOUTH 2 (TWO) TIMES DAILY AS NEEDED. 60 tablet 0  . vitamin C (ASCORBIC ACID) 500 MG tablet Take 500 mg by mouth daily.    Marland Kitchen apixaban (ELIQUIS) 5 MG TABS tablet Take 1 tablet (5 mg total) by  mouth 2 (two) times daily. 60 tablet 5   No current facility-administered medications for this visit.     PHYSICAL EXAMINATION: ECOG PERFORMANCE STATUS: 3 - Symptomatic, >50% confined to bed  BP (!) 155/84 (BP Location: Left Arm, Patient Position: Sitting, Cuff Size: Normal)   Pulse 69   Temp 98.1 F (36.7 C) (Tympanic)   Resp 15   There were no vitals filed for this visit.  Physical Exam  Constitutional: She is oriented to person, place, and time and well-developed, well-nourished, and in no distress.  Frail-appearing.  Resting in wheelchair.  Accompanied by daughter.  HENT:  Head: Normocephalic and atraumatic.  Mouth/Throat: Oropharynx is clear and moist. No oropharyngeal exudate.  Eyes: Pupils are equal, round, and reactive to light.  Neck: Normal range of motion. Neck supple.  Cardiovascular: Normal rate and regular rhythm.  Pulmonary/Chest: No respiratory distress. She has no wheezes.  Abdominal: Soft. Bowel sounds are normal. She exhibits no distension and no mass. There is no abdominal tenderness. There is no rebound and no guarding.  Musculoskeletal: Normal range  of motion.        General: No tenderness or edema.     Comments: Right lower extremity swollen compared to the left.  Tenderness to touch.  Neurological: She is alert and oriented to person, place, and time.  Skin: Skin is warm.  Psychiatric: Affect normal.       LABORATORY DATA:  I have reviewed the data as listed    Component Value Date/Time   NA 140 09/05/2018 1226   NA 141 09/11/2015 0920   K 3.6 09/05/2018 1226   CL 104 09/05/2018 1226   CO2 29 09/05/2018 1226   GLUCOSE 151 (H) 09/05/2018 1226   BUN 16 09/05/2018 1226   BUN 25 09/11/2015 0920   CREATININE 1.10 (H) 09/05/2018 1226   CALCIUM 9.2 09/05/2018 1226   PROT 7.3 09/05/2018 1226   ALBUMIN 4.2 09/05/2018 1226   AST 16 09/05/2018 1226   ALT 7 09/05/2018 1226   ALKPHOS 73 09/05/2018 1226   BILITOT 0.5 09/05/2018 1226   GFRNONAA 45  (L) 09/05/2018 1226   GFRAA 52 (L) 09/05/2018 1226    No results found for: SPEP, UPEP  Lab Results  Component Value Date   WBC 4.9 09/05/2018   NEUTROABS 2.4 09/05/2018   HGB 12.2 09/05/2018   HCT 38.8 09/05/2018   MCV 89.4 09/05/2018   PLT 235 09/05/2018      Chemistry      Component Value Date/Time   NA 140 09/05/2018 1226   NA 141 09/11/2015 0920   K 3.6 09/05/2018 1226   CL 104 09/05/2018 1226   CO2 29 09/05/2018 1226   BUN 16 09/05/2018 1226   BUN 25 09/11/2015 0920   CREATININE 1.10 (H) 09/05/2018 1226      Component Value Date/Time   CALCIUM 9.2 09/05/2018 1226   ALKPHOS 73 09/05/2018 1226   AST 16 09/05/2018 1226   ALT 7 09/05/2018 1226   BILITOT 0.5 09/05/2018 1226       RADIOGRAPHIC STUDIES: I have personally reviewed the radiological images as listed and agreed with the findings in the report. US Venous Img Lower Unilateral Right  Result Date: 09/05/2018 CLINICAL DATA:  85 year old female with right leg swelling and pain EXAM: RIGHT LOWER EXTREMITY VENOUS DOPPLER ULTRASOUND TECHNIQUE: Gray-scale sonography with graded compression, as well as color Doppler and duplex ultrasound were performed to evaluate the lower extremity deep venous systems from the level of the common femoral vein and including the common femoral, femoral, profunda femoral, popliteal and calf veins including the posterior tibial, peroneal and gastrocnemius veins when visible. The superficial great saphenous vein was also interrogated. Spectral Doppler was utilized to evaluate flow at rest and with distal augmentation maneuvers in the common femoral, femoral and popliteal veins. COMPARISON:  None. FINDINGS: Contralateral Common Femoral Vein: Respiratory phasicity is normal and symmetric with the symptomatic side. No evidence of thrombus. Normal compressibility. Noncompressible right lower extremity deep veins involving the common femoral vein, profunda, saphenofemoral junction, femoral vein,  and the popliteal vein. Partial flow maintained, with heterogeneous echogenicity of the visualized thrombus. Calf Veins: Limited visualization of the peroneal vein. Posterior tibial vein patent with compressible vein and flow maintained. Superficial Great Saphenous Vein: No evidence of thrombus. Normal compressibility and flow on color Doppler imaging. Other Findings:  None. IMPRESSION: Sonographic survey of the right lower extremity positive for nonocclusive DVT involving the femoral vein, saphenofemoral junction, profunda, femoral vein and popliteal vein. Electronically Signed   By: Corrie Mckusick D.O.   On:  09/05/2018 14:51     ASSESSMENT & PLAN:  Colon cancer metastasized to intra-abdominal lymph node ([place]) # Colon cancer-stage III [2016-pT4N1a] with multiple liver cysts on the CT scan]. No adjuvant therapy [ given multiple comorbidities for performance status].   #CT scan-August 2019- for recurrence.  August 2019-colonoscopy/EGD-negative; however CEA rising-August 2019 130.  CEA from today is pending.  Given the worsening abdominal pain-I suspect patient will require a repeat CT scan to rule out recurrence especially given the new DVT see below.  # Abdominal pain- unclear etiology. Incisional question neuropathic.poor tolerance to gabapentin/amitriptyline; on oxycodone 3 pills a day.  Worse.  See above  #Endometrial thickening- unable to do biopsy; secondary to cervical stenosis [Dr.Schermerhorn] no vaginal bleeding stable.  # skin rash-left elbow fold.;  Recommend antifungal.  #Right lower extremity swelling/pain-stat lower extremity venous Dopplers.  # DISPOSITION: # follow up in 2 weeks-cbc/bmp-dr.B  Addendum: Stat right lower extremity venous Doppler showed extensive DVT involving about the thigh and below the thigh.  Start the patient on Eliquis 5 mg twice daily; voucher was given.  Discussed the mechanism of action/patient precautions in detail.  Await CEA-if rising/elevated will  recommend CT scan abdomen pelvis/mostly for prognostic reasons.  # 40 minutes face-to-face with the patient discussing the above plan of care; more than 50% of time spent on prognosis/ natural history; counseling and coordination.     Orders Placed This Encounter  Procedures  . US Venous Img Lower Unilateral Right    Standing Status:   Future    Number of Occurrences:   1    Standing Expiration Date:   11/05/2019    Order Specific Question:   Reason for Exam (SYMPTOM  OR DIAGNOSIS REQUIRED)    Answer:   right LEG swelling/pain    Order Specific Question:   Preferred imaging location?    Answer:   [facility]    Order Specific Question:   Call Results- Best Contact Number?    Answer:   [phone]...Marland Kitchenhold pt   All questions were answered. The patient knows to call the clinic with any problems, questions or concerns.      Cammie Sickle, MD 09/05/2018 9:06 PM

## 2018-09-05 NOTE — Telephone Encounter (Signed)
Called daughter [name], phone got disconnected

## 2018-09-05 NOTE — Telephone Encounter (Signed)
Pt had appt at [facility] and pt had [place] of right leg and they found a blood clot. Pt's daughter want to know what does pt need to do because of the blood clot. Please call daughter

## 2018-09-06 LAB — CEA: CEA1: 480 ng/mL — AB (ref 0.0–4.7)

## 2018-09-12 ENCOUNTER — Telehealth: Payer: Self-pay | Admitting: Internal Medicine

## 2018-09-12 DIAGNOSIS — C189 Malignant neoplasm of colon, unspecified: Secondary | ICD-10-CM

## 2018-09-12 DIAGNOSIS — C772 Secondary and unspecified malignant neoplasm of intra-abdominal lymph nodes: Principal | ICD-10-CM

## 2018-09-12 NOTE — Telephone Encounter (Signed)
Spoke to pt's daughter, Freda Munro regarding elevated CEA-recommend a PET scan for further evaluation.[if not CT scan.]  PET scan ASAP/follow-up 1 to 2 days later/ no labs.   Thanks GB

## 2018-09-21 ENCOUNTER — Other Ambulatory Visit: Payer: Self-pay

## 2018-09-21 ENCOUNTER — Ambulatory Visit
Admission: RE | Admit: 2018-09-21 | Discharge: 2018-09-21 | Disposition: A | Discharge status: Home / Self Care | Payer: Medicare Other | Source: Ambulatory Visit | Attending: Internal Medicine | Admitting: Internal Medicine

## 2018-09-21 DIAGNOSIS — I2510 Atherosclerotic heart disease of native coronary artery without angina pectoris: Secondary | ICD-10-CM | POA: Insufficient documentation

## 2018-09-21 DIAGNOSIS — C189 Malignant neoplasm of colon, unspecified: Secondary | ICD-10-CM | POA: Insufficient documentation

## 2018-09-21 DIAGNOSIS — C772 Secondary and unspecified malignant neoplasm of intra-abdominal lymph nodes: Secondary | ICD-10-CM | POA: Diagnosis not present

## 2018-09-21 LAB — GLUCOSE, CAPILLARY: GLUCOSE-CAPILLARY: 97 mg/dL (ref 70–99)

## 2018-09-21 MED ORDER — FLUDEOXYGLUCOSE F - 18 (FDG) INJECTION
11.2000 | Freq: Once | INTRAVENOUS | Status: AC | PRN
  Administered 2018-09-21: 11.2 via INTRAVENOUS

## 2018-09-22 ENCOUNTER — Other Ambulatory Visit: Payer: Self-pay

## 2018-09-22 ENCOUNTER — Encounter: Payer: Self-pay | Admitting: Internal Medicine

## 2018-09-22 ENCOUNTER — Telehealth: Payer: Self-pay | Admitting: Internal Medicine

## 2018-09-22 ENCOUNTER — Inpatient Hospital Stay (HOSPITAL_BASED_OUTPATIENT_CLINIC_OR_DEPARTMENT_OTHER): Payer: Medicare Other | Admitting: Internal Medicine

## 2018-09-22 VITALS — BP 133/70 | HR 79 | Temp 97.1°F | Resp 16

## 2018-09-22 DIAGNOSIS — K5900 Constipation, unspecified: Secondary | ICD-10-CM

## 2018-09-22 DIAGNOSIS — R970 Elevated carcinoembryonic antigen [CEA]: Secondary | ICD-10-CM | POA: Diagnosis not present

## 2018-09-22 DIAGNOSIS — C772 Secondary and unspecified malignant neoplasm of intra-abdominal lymph nodes: Secondary | ICD-10-CM | POA: Diagnosis not present

## 2018-09-22 DIAGNOSIS — Z800 Family history of malignant neoplasm of digestive organs: Secondary | ICD-10-CM

## 2018-09-22 DIAGNOSIS — I82401 Acute embolism and thrombosis of unspecified deep veins of right lower extremity: Secondary | ICD-10-CM

## 2018-09-22 DIAGNOSIS — C189 Malignant neoplasm of colon, unspecified: Secondary | ICD-10-CM

## 2018-09-22 DIAGNOSIS — G8929 Other chronic pain: Secondary | ICD-10-CM

## 2018-09-22 DIAGNOSIS — R9389 Abnormal findings on diagnostic imaging of other specified body structures: Secondary | ICD-10-CM

## 2018-09-22 DIAGNOSIS — Z7901 Long term (current) use of anticoagulants: Secondary | ICD-10-CM

## 2018-09-22 DIAGNOSIS — R21 Rash and other nonspecific skin eruption: Secondary | ICD-10-CM

## 2018-09-22 DIAGNOSIS — R109 Unspecified abdominal pain: Secondary | ICD-10-CM

## 2018-09-22 NOTE — Telephone Encounter (Signed)
Left a message for patient's daughter regarding my discussion with the GI doctor Dr. Bonna Gains.  Plan is to have a colonoscopy in approximately 1 month; and she could come off her Eliquis/blood thinner 2 days prior to the procedure; can go back on the blood thinner the day after.

## 2018-09-22 NOTE — Assessment & Plan Note (Addendum)
#   Colon cancer-stage III [2016-pT4N1a] with multiple liver cysts on the CT scan]. No adjuvant therapy [ given multiple comorbidities for performance status].  Significantly rising CEA-March 2020-480.  #PET scan March 2020-shows area of uptake in the sigmoid area.  Otherwise no distant metastatic disease or local recurrence noted.  Discussed the discrepant results of significantly elevated CEA/sigmoid uptake with the daughter in detail.  However would recommend formal evaluation colonoscopy in the next month or so [given the recent DVT see below].  #Right lower extremity DVT above the knee-continue Eliquis.  Patient could come off Eliquis in approximately a month/end of April for a colonoscopy.  Eliquis should be stopped 2 days prior to her procedure and could be restarted the day after procedure.  # Abdominal pain- unclear etiology. Incisional question neuropathic.poor tolerance to gabapentin/amitriptyline; on oxycodone 3 pills a day.  Stable.  #Endometrial thickening- unable to do biopsy; secondary to cervical stenosis [Dr.Schermerhorn] stable.  # skin rash-left elbow fold-recommend antifungal.  # DISPOSITION: # Follow up in 2 months/labs- cbc/cmp/cea-Dr.B  Also discussed with Dr. Bonna Gains; her office will call to set up colonoscopy/EGD.  In approximately 1 month  # I reviewed the blood work- with the patient in detail; also reviewed the imaging independently [as summarized above]; and with the patient in detail.   Marland Kitchen

## 2018-09-22 NOTE — Progress Notes (Signed)
[facility] OFFICE PROGRESS NOTE  Patient Care Team: Jearld Fenton, NP as PCP - General (Internal Medicine)  Cancer Staging No matching staging information was found for the patient.   Oncology History   # July 2016- COLON CANCER; mod diff adeno; STAGE III (pT4aN1a; MSI-STABLE) [s/p surgery;Dr. Arbutus [facility] [place]];No adjuvant chemo sec to co-morbdities DEC 2016- CT-C/ A/P [[facility]]- subcentimeter hepatic cysts ; irregular soft tissue density in the peritoneal fat within pelvis ? Scarring  [less likely recurrence];    # EGD/colonoscopy- AUG 2019- NEG s/p 2u PRBC.    # 10th  MARCH 2020- R LE DVT- start eliquis   # left thyroid nodule- No Bx  # endometrial thickening. Biopsy not done because of cervical stenosis [Dr.Schermerhorn].  # July R LE DVT/Bil PE s/p IVC filter; Hx of GIB; AKI; Hx of Severe Anemia; hx CHF   ----------------------------------------------------------------------   DIAGNOSIS: COLON CANCER  STAGE:  III       ;GOALS: curative  CURRENT/MOST RECENT THERAPY : surveillance      Colon cancer metastasized to intra-abdominal lymph node (HCC)      INTERVAL HISTORY:  Jill Bowers 85 y.o.  female pleasant patient above history of colon cancer stage III high risk-2016; no adjuvant therapy secondary to multiple comorbidities with elevated CEA is here for follow-up/review this of the PET scan.  The interim approximately 2 weeks ago patient was diagnosed with right lower extremity DVT extensive.  Currently on Eliquis.  Since been Eliquis her leg swelling pain is improved.  Patient continues to have chronic abdominal pain which is not any better.  Chronic mild constipation.   Review of Systems  Constitutional: Positive for malaise/fatigue. Negative for chills, diaphoresis, fever and weight loss.  HENT: Negative for nosebleeds and sore throat.   Eyes: Negative for double vision.  Respiratory: Negative for cough, hemoptysis, sputum production,  shortness of breath and wheezing.   Cardiovascular: Positive for leg swelling. Negative for chest pain, palpitations and orthopnea.  Gastrointestinal: Positive for abdominal pain (Chronic incisional abdominal pain.). Negative for blood in stool, constipation, diarrhea, heartburn, melena, nausea and vomiting.  Genitourinary: Negative for dysuria, frequency and urgency.  Musculoskeletal: Positive for back pain and joint pain.  Skin: Negative.  Negative for itching and rash.  Neurological: Negative for dizziness, tingling, focal weakness, weakness and headaches.  Endo/Heme/Allergies: Does not bruise/bleed easily.  Psychiatric/Behavioral: Negative for depression. The patient is not nervous/anxious and does not have insomnia.       PAST MEDICAL HISTORY :  Past Medical History:  Diagnosis Date  . CHF (congestive heart failure) ([place])   . Colon cancer ([place])   . Complication of anesthesia    difficulty waking up afcter colon surgery  . Coronary artery disease   . DVT (deep venous thrombosis) ([place])   . GERD (gastroesophageal reflux disease)   . Hypertension   . Malignant neoplasm of stomach ([place])   . PE (pulmonary embolism)   . Renal disorder   . Renal insufficiency     PAST SURGICAL HISTORY :   Past Surgical History:  Procedure Laterality Date  . ABDOMINAL HYSTERECTOMY    . COLON SURGERY    . COLONOSCOPY N/A 02/01/2018   Procedure: COLONOSCOPY;  Surgeon: Virgel Manifold, MD;  Location: [facility] ENDOSCOPY;  Service: Endoscopy;  Laterality: N/A;  . COLONOSCOPY WITH PROPOFOL N/A 08/23/2016   Procedure: COLONOSCOPY WITH PROPOFOL;  Surgeon: Jonathon Bellows, MD;  Location: [facility] ENDOSCOPY;  Service: Gastroenterology;  Laterality: N/A;  . COLONOSCOPY WITH  PROPOFOL N/A 08/24/2016   Procedure: COLONOSCOPY WITH PROPOFOL;  Surgeon: Jonathon Bellows, MD;  Location: [facility] ENDOSCOPY;  Service: Endoscopy;  Laterality: N/A;  . ESOPHAGOGASTRODUODENOSCOPY N/A 02/01/2018   Procedure: ESOPHAGOGASTRODUODENOSCOPY (EGD);   Surgeon: Virgel Manifold, MD;  Location: [facility] ENDOSCOPY;  Service: Endoscopy;  Laterality: N/A;  . ESOPHAGOGASTRODUODENOSCOPY (EGD) WITH PROPOFOL N/A 03/29/2018   Procedure: ESOPHAGOGASTRODUODENOSCOPY (EGD) WITH PROPOFOL;  Surgeon: Virgel Manifold, MD;  Location: [facility] ENDOSCOPY;  Service: Endoscopy;  Laterality: N/A;  . IVC FILTER PLACEMENT ([employer] HX)    . JOINT REPLACEMENT Right 2000   knee    FAMILY HISTORY :   Family History  Problem Relation Age of Onset  . Arthritis Mother   . Hypertension Mother   . Colon cancer Sister   . Heart disease Daughter   . Colon cancer Son   . Pancreatic cancer Son     SOCIAL HISTORY:   Social History   Tobacco Use  . Smoking status: Never Smoker  . Smokeless tobacco: Never Used  Substance Use Topics  . Alcohol use: No  . Drug use: No    ALLERGIES:  is allergic to gabapentin; reglan [metoclopramide]; statins; and torsemide.  MEDICATIONS:  Current Outpatient Medications  Medication Sig Dispense Refill  . acetaminophen (TYLENOL) 650 MG CR tablet Take 650 mg by mouth every 8 (eight) hours as needed for pain.    Marland Kitchen apixaban (ELIQUIS) 5 MG TABS tablet Take 1 tablet (5 mg total) by mouth 2 (two) times daily. 60 tablet 5  . Cholecalciferol (VITAMIN D-3) 1000 UNITS CAPS Take 2 capsules by mouth daily.    . COMBIGAN 0.2-0.5 % ophthalmic solution Place 1 drop into both eyes 2 (two) times daily.  4  . furosemide (LASIX) 40 MG tablet TAKE 1 TABLET BY MOUTH EVERY DAY 30 tablet 0  . Magnesium Oxide 500 MG TABS Take 1 tablet by mouth 2 (two) times daily.    Marland Kitchen MEGARED OMEGA-3 KRILL OIL 500 MG CAPS Take by mouth.    Marland Kitchen omeprazole (PRILOSEC) 20 MG capsule Take 20 mg by mouth daily.    Marland Kitchen oxyCODONE-acetaminophen (PERCOCET) 10-325 MG tablet Take 1 tablet by mouth every 8 (eight) hours as needed for pain. 90 tablet 0  . Polyethylene Glycol 3350 (MIRALAX PO) Take by mouth daily.    Marland Kitchen spironolactone (ALDACTONE) 25 MG tablet TAKE 1 TABLET (25 MG TOTAL) BY  MOUTH 2 (TWO) TIMES DAILY AS NEEDED. 60 tablet 0  . vitamin C (ASCORBIC ACID) 500 MG tablet Take 500 mg by mouth daily.     No current facility-administered medications for this visit.     PHYSICAL EXAMINATION: ECOG PERFORMANCE STATUS: 3 - Symptomatic, >50% confined to bed  BP 133/70 (BP Location: Left Arm, Patient Position: Sitting, Cuff Size: Normal)   Pulse 79   Temp (!) 97.1 F (36.2 C) (Tympanic)   Resp 16   There were no vitals filed for this visit.  Physical Exam  Constitutional: She is oriented to person, place, and time and well-developed, well-nourished, and in no distress.  Frail-appearing.  Resting in wheelchair.  Accompanied by daughter.  HENT:  Head: Normocephalic and atraumatic.  Mouth/Throat: Oropharynx is clear and moist. No oropharyngeal exudate.  Eyes: Pupils are equal, round, and reactive to light.  Neck: Normal range of motion. Neck supple.  Cardiovascular: Normal rate and regular rhythm.  Pulmonary/Chest: No respiratory distress. She has no wheezes.  Abdominal: Soft. Bowel sounds are normal. She exhibits no distension and no mass. There is no  abdominal tenderness. There is no rebound and no guarding.  Musculoskeletal: Normal range of motion.        General: No tenderness or edema.     Comments: Right lower extremity swollen compared to the left.  Tenderness to touch.  Neurological: She is alert and oriented to person, place, and time.  Skin: Skin is warm.  Psychiatric: Affect normal.       LABORATORY DATA:  I have reviewed the data as listed    Component Value Date/Time   NA 140 09/05/2018 1226   NA 141 09/11/2015 0920   K 3.6 09/05/2018 1226   CL 104 09/05/2018 1226   CO2 29 09/05/2018 1226   GLUCOSE 151 (H) 09/05/2018 1226   BUN 16 09/05/2018 1226   BUN 25 09/11/2015 0920   CREATININE 1.10 (H) 09/05/2018 1226   CALCIUM 9.2 09/05/2018 1226   PROT 7.3 09/05/2018 1226   ALBUMIN 4.2 09/05/2018 1226   AST 16 09/05/2018 1226   ALT 7 09/05/2018  1226   ALKPHOS 73 09/05/2018 1226   BILITOT 0.5 09/05/2018 1226   GFRNONAA 45 (L) 09/05/2018 1226   GFRAA 52 (L) 09/05/2018 1226    No results found for: SPEP, UPEP  Lab Results  Component Value Date   WBC 4.9 09/05/2018   NEUTROABS 2.4 09/05/2018   HGB 12.2 09/05/2018   HCT 38.8 09/05/2018   MCV 89.4 09/05/2018   PLT 235 09/05/2018      Chemistry      Component Value Date/Time   NA 140 09/05/2018 1226   NA 141 09/11/2015 0920   K 3.6 09/05/2018 1226   CL 104 09/05/2018 1226   CO2 29 09/05/2018 1226   BUN 16 09/05/2018 1226   BUN 25 09/11/2015 0920   CREATININE 1.10 (H) 09/05/2018 1226      Component Value Date/Time   CALCIUM 9.2 09/05/2018 1226   ALKPHOS 73 09/05/2018 1226   AST 16 09/05/2018 1226   ALT 7 09/05/2018 1226   BILITOT 0.5 09/05/2018 1226       RADIOGRAPHIC STUDIES: I have personally reviewed the radiological images as listed and agreed with the findings in the report. Nm Pet Image Restag (ps) Skull Base To Thigh  Result Date: 09/21/2018 CLINICAL DATA:  Subsequent treatment strategy for colon cancer metastasis to intra-abdominal lymph nodes. Rising CEA. EXAM: NUCLEAR MEDICINE PET SKULL BASE TO THIGH TECHNIQUE: 11.2 mCi F-18 FDG was injected intravenously. Full-ring PET imaging was performed from the skull base to thigh after the radiotracer. CT data was obtained and used for attenuation correction and anatomic localization. Fasting blood glucose: 97 mg/dl COMPARISON:  08/03/2017 PET.  01/30/2018 abdominopelvic CT. FINDINGS: Mediastinal blood pool activity: SUV max 3.0 NECK: Left-sided thyroid nodule measures 1.4 cm and a S.U.V. max of 6.9. Compare 1.3 cm and a S.U.V. max of 6.0. No cervical nodal hypermetabolism. Incidental CT findings: No cervical adenopathy. Nonspecific right-sided thyroid nodules. CHEST: No pulmonary parenchymal or thoracic nodal hypermetabolism. Incidental CT findings: Moderate right hemidiaphragm elevation. Mild cardiomegaly with  lipomatous hypertrophy of the interatrial septum. Aortic and coronary artery atherosclerosis. Pulmonary artery enlargement, outflow tract 3.7 cm. ABDOMEN/PELVIS: Within the mid sigmoid colon, hypermetabolism corresponds to an area of posterior wall thickening. This measures a S.U.V. max of 14.4 on image 160/4. No abdominopelvic nodal hypermetabolism. Incidental CT findings: Partial right hemicolectomy. Hepatic and pancreatic cysts have been detailed previously. Normal adrenal glands. Upper pole right renal cyst. Abdominal aortic atherosclerosis. Appropriately positioned IVC filter. Calcified uterine fibroids. Central uterine  hypoattenuation is chronic and likely indicative of endometrial fluid due to cervical stenosis. Extensive colonic diverticulosis. SKELETON: No abnormal marrow activity. Incidental CT findings: none IMPRESSION: 1. Status post partial right hemicolectomy. No findings of hypermetabolic metastatic disease. 2. Mid sigmoid area of hypermetabolism and wall thickening, suspicious for metachronous colonic primary. Unless the patient has recently undergone optical colonoscopy, this should be considered. 3. Coronary artery atherosclerosis. Aortic Atherosclerosis (ICD10-I70.0). 4. Pulmonary artery enlargement suggests pulmonary arterial hypertension. 5. Similar to slight enlargement of a hypermetabolic left thyroid nodule, which remains indeterminate but is of questionable clinical significance given patient age and comorbidities. Electronically Signed   By: Abigail Miyamoto M.D.   On: 09/21/2018 14:25     ASSESSMENT & PLAN:  Colon cancer metastasized to intra-abdominal lymph node ([place]) # Colon cancer-stage III [2016-pT4N1a] with multiple liver cysts on the CT scan]. No adjuvant therapy [ given multiple comorbidities for performance status].  Significantly rising CEA-March 2020-480.  #PET scan March 2020-shows area of uptake in the sigmoid area.  Otherwise no distant metastatic disease or local  recurrence noted.  Discussed the discrepant results of significantly elevated CEA/sigmoid uptake with the daughter in detail.  However would recommend formal evaluation colonoscopy in the next month or so [given the recent DVT see below].  #Right lower extremity DVT above the knee-continue Eliquis.  Patient could come off Eliquis in approximately a month/end of April for a colonoscopy.  Eliquis should be stopped 2 days prior to her procedure and could be restarted the day after procedure.  # Abdominal pain- unclear etiology. Incisional question neuropathic.poor tolerance to gabapentin/amitriptyline; on oxycodone 3 pills a day.  Stable.  #Endometrial thickening- unable to do biopsy; secondary to cervical stenosis [Dr.Schermerhorn] stable.  # skin rash-left elbow fold-recommend antifungal.  # DISPOSITION: # Follow up in 2 months/labs- cbc/cmp/cea-Dr.B  Also discussed with Dr. Bonna Gains; her office will call to set up colonoscopy/EGD.  In approximately 1 month  # I reviewed the blood work- with the patient in detail; also reviewed the imaging independently [as summarized above]; and with the patient in detail.   .        No orders of the defined types were placed in this encounter.  All questions were answered. The patient knows to call the clinic with any problems, questions or concerns.      Cammie Sickle, MD 09/22/2018 12:22 PM

## 2018-09-25 ENCOUNTER — Ambulatory Visit (INDEPENDENT_AMBULATORY_CARE_PROVIDER_SITE_OTHER): Payer: Medicare Other | Admitting: Gastroenterology

## 2018-09-25 ENCOUNTER — Telehealth: Payer: Self-pay | Admitting: Gastroenterology

## 2018-09-25 ENCOUNTER — Other Ambulatory Visit: Payer: Self-pay

## 2018-09-25 ENCOUNTER — Encounter: Payer: Self-pay | Admitting: Gastroenterology

## 2018-09-25 DIAGNOSIS — R948 Abnormal results of function studies of other organs and systems: Secondary | ICD-10-CM | POA: Diagnosis not present

## 2018-09-25 NOTE — Telephone Encounter (Signed)
Spoke with Pts daughter to schedule televisit with Dr. Bonna Gains pt is scheduled for 09/25/18 at 10:50 am

## 2018-09-25 NOTE — Progress Notes (Signed)
Jill Antigua, MD [address]  [place]  [place],  [address]  Main: [phone]  Fax: [phone]   Primary Care Physician: Jearld Fenton, NP  Virtual Visit via Telephone Note  I connected with patient on 09/25/18 at 10:50 AM EDT by telephone and verified that I am speaking with the correct person using two identifiers.   I discussed the limitations, risks, security and privacy concerns of performing an evaluation and management service by telephone and the availability of in person appointments. I also discussed with the patient that there may be a patient responsible charge related to this service. The patient expressed understanding and agreed to proceed.  Location of Patient: Patient's daughter's home, this is where the patient lives Location of Provider: Home Persons involved: Patient, patient's daughter Jill Bowers, and provider    History of Present Illness: CC: Elevated cancer marker  HPI: Jill Bowers is a 85 y.o. female previously seen for rectal bleeding due to diverticulosis in September 2019, with a history of colon cancer in July 2016, stage III, status post surgery.  Patient being followed by oncology, Dr. Rogue Bussing, who is requesting colonoscopy due to positive PET scan in the colon and increasing CEA level.  Patient has chronic history of abdominal discomfort which has not changed or worsened.  No dysphagia, no nausea or vomiting, no blood in stool, no melena, no changes in appetite, no weight loss.  Previous history: admitted in August 2019 for hematochezia, and underwent EGD and colonoscopy.  Colonoscopy showed diverticuli in the sigmoid colon and descending colon, with no active bleeding.  However, stool was also partially covered with old blood, suggestive of resolved diverticular bleeding.  EGD showed single 5 mm mucosal nodule in the second portion of the duodenum.  This was thought to be suction artifact, and was unable to be located again for  biopsies despite multiple sweeps of the area.  Otherwise EGD was normal.    EGD was repeated in October 2019 and a duodenal polyp was found and biopsied.  This was noted across from the ampulla.  Biopsies showed duodenal mucosa with nonspecific chronic inflammation and Brunner's gland hyperplasia.  Negative for dysplasia and malignancy.  Gastric biopsies showed no pathologic changes, no H. pylori.  Patient has had one other episode of diverticular bleeding in 2018.   Previous history: History of colon cancer 2016, underwent surgery for this in [place] at the time.As per Dr. Verlin Grills February 2018 consult note, lymph nodes were positive and she was offered chemo but patient decided not to undergo chemo at that time.    Current Outpatient Medications  Medication Sig Dispense Refill   acetaminophen (TYLENOL) 650 MG CR tablet Take 650 mg by mouth every 8 (eight) hours as needed for pain.     apixaban (ELIQUIS) 5 MG TABS tablet Take 1 tablet (5 mg total) by mouth 2 (two) times daily. 60 tablet 5   Cholecalciferol (VITAMIN D-3) 1000 UNITS CAPS Take 2 capsules by mouth daily.     COMBIGAN 0.2-0.5 % ophthalmic solution Place 1 drop into both eyes 2 (two) times daily.  4   furosemide (LASIX) 40 MG tablet TAKE 1 TABLET BY MOUTH EVERY DAY 30 tablet 0   Magnesium Oxide 500 MG TABS Take 1 tablet by mouth 2 (two) times daily.     MEGARED OMEGA-3 KRILL OIL 500 MG CAPS Take by mouth.     omeprazole (PRILOSEC) 20 MG capsule Take 20 mg by mouth daily.  oxyCODONE-acetaminophen (PERCOCET) 10-325 MG tablet Take 1 tablet by mouth every 8 (eight) hours as needed for pain. 90 tablet 0   Polyethylene Glycol 3350 (MIRALAX PO) Take by mouth daily.     spironolactone (ALDACTONE) 25 MG tablet TAKE 1 TABLET (25 MG TOTAL) BY MOUTH 2 (TWO) TIMES DAILY AS NEEDED. 60 tablet 0   vitamin C (ASCORBIC ACID) 500 MG tablet Take 500 mg by mouth daily.     No current facility-administered medications for this  visit.     Allergies as of 09/25/2018 - Review Complete 09/25/2018  Allergen Reaction Noted   Gabapentin Other (See Comments) 09/16/2016   Reglan [metoclopramide] Other (See Comments) 05/16/2015   Statins Other (See Comments) 05/16/2015   Torsemide Other (See Comments) 05/16/2015    Review of Systems:    All systems reviewed and negative except where noted in HPI.   Observations/Objective:  Labs: CMP     Component Value Date/Time   NA 140 09/05/2018 1226   NA 141 09/11/2015 0920   K 3.6 09/05/2018 1226   CL 104 09/05/2018 1226   CO2 29 09/05/2018 1226   GLUCOSE 151 (H) 09/05/2018 1226   BUN 16 09/05/2018 1226   BUN 25 09/11/2015 0920   CREATININE 1.10 (H) 09/05/2018 1226   CALCIUM 9.2 09/05/2018 1226   PROT 7.3 09/05/2018 1226   ALBUMIN 4.2 09/05/2018 1226   AST 16 09/05/2018 1226   ALT 7 09/05/2018 1226   ALKPHOS 73 09/05/2018 1226   BILITOT 0.5 09/05/2018 1226   GFRNONAA 45 (L) 09/05/2018 1226   GFRAA 52 (L) 09/05/2018 1226   Lab Results  Component Value Date   WBC 4.9 09/05/2018   HGB 12.2 09/05/2018   HCT 38.8 09/05/2018   MCV 89.4 09/05/2018   PLT 235 09/05/2018    Imaging Studies: Nm Pet Image Restag (ps) Skull Base To Thigh  Result Date: 09/21/2018 CLINICAL DATA:  Subsequent treatment strategy for colon cancer metastasis to intra-abdominal lymph nodes. Rising CEA. EXAM: NUCLEAR MEDICINE PET SKULL BASE TO THIGH TECHNIQUE: 11.2 mCi F-18 FDG was injected intravenously. Full-ring PET imaging was performed from the skull base to thigh after the radiotracer. CT data was obtained and used for attenuation correction and anatomic localization. Fasting blood glucose: 97 mg/dl COMPARISON:  08/03/2017 PET.  01/30/2018 abdominopelvic CT. FINDINGS: Mediastinal blood pool activity: SUV max 3.0 NECK: Left-sided thyroid nodule measures 1.4 cm and a S.U.V. max of 6.9. Compare 1.3 cm and a S.U.V. max of 6.0. No cervical nodal hypermetabolism. Incidental CT findings: No  cervical adenopathy. Nonspecific right-sided thyroid nodules. CHEST: No pulmonary parenchymal or thoracic nodal hypermetabolism. Incidental CT findings: Moderate right hemidiaphragm elevation. Mild cardiomegaly with lipomatous hypertrophy of the interatrial septum. Aortic and coronary artery atherosclerosis. Pulmonary artery enlargement, outflow tract 3.7 cm. ABDOMEN/PELVIS: Within the mid sigmoid colon, hypermetabolism corresponds to an area of posterior wall thickening. This measures a S.U.V. max of 14.4 on image 160/4. No abdominopelvic nodal hypermetabolism. Incidental CT findings: Partial right hemicolectomy. Hepatic and pancreatic cysts have been detailed previously. Normal adrenal glands. Upper pole right renal cyst. Abdominal aortic atherosclerosis. Appropriately positioned IVC filter. Calcified uterine fibroids. Central uterine hypoattenuation is chronic and likely indicative of endometrial fluid due to cervical stenosis. Extensive colonic diverticulosis. SKELETON: No abnormal marrow activity. Incidental CT findings: none IMPRESSION: 1. Status post partial right hemicolectomy. No findings of hypermetabolic metastatic disease. 2. Mid sigmoid area of hypermetabolism and wall thickening, suspicious for metachronous colonic primary. Unless the patient has recently undergone optical colonoscopy,  this should be considered. 3. Coronary artery atherosclerosis. Aortic Atherosclerosis (ICD10-I70.0). 4. Pulmonary artery enlargement suggests pulmonary arterial hypertension. 5. Similar to slight enlargement of a hypermetabolic left thyroid nodule, which remains indeterminate but is of questionable clinical significance given patient age and comorbidities. Electronically Signed   By: Abigail Miyamoto M.D.   On: 09/21/2018 14:25   US Venous Img Lower Unilateral Right  Result Date: 09/05/2018 CLINICAL DATA:  85 year old female with right leg swelling and pain EXAM: RIGHT LOWER EXTREMITY VENOUS DOPPLER ULTRASOUND TECHNIQUE:  Gray-scale sonography with graded compression, as well as color Doppler and duplex ultrasound were performed to evaluate the lower extremity deep venous systems from the level of the common femoral vein and including the common femoral, femoral, profunda femoral, popliteal and calf veins including the posterior tibial, peroneal and gastrocnemius veins when visible. The superficial great saphenous vein was also interrogated. Spectral Doppler was utilized to evaluate flow at rest and with distal augmentation maneuvers in the common femoral, femoral and popliteal veins. COMPARISON:  None. FINDINGS: Contralateral Common Femoral Vein: Respiratory phasicity is normal and symmetric with the symptomatic side. No evidence of thrombus. Normal compressibility. Noncompressible right lower extremity deep veins involving the common femoral vein, profunda, saphenofemoral junction, femoral vein, and the popliteal vein. Partial flow maintained, with heterogeneous echogenicity of the visualized thrombus. Calf Veins: Limited visualization of the peroneal vein. Posterior tibial vein patent with compressible vein and flow maintained. Superficial Great Saphenous Vein: No evidence of thrombus. Normal compressibility and flow on color Doppler imaging. Other Findings:  None. IMPRESSION: Sonographic survey of the right lower extremity positive for nonocclusive DVT involving the femoral vein, saphenofemoral junction, profunda, femoral vein and popliteal vein. Electronically Signed   By: Corrie Mckusick D.O.   On: 09/05/2018 14:51    Assessment and Plan:   Jill Bowers is a 85 y.o. y/o female with history of colon cancer in 2016 status post surgery, being followed by oncology, with elevated CEA level, with recent PET scan showing hypermetabolic him and wall thickening in the mid sigmoid area  Assessment and Plan: Due to the elevated CEA level, Dr. Rogue Bussing is requesting further evaluation with colonoscopy.  This is reasonable, since the  PET scan is positive in the area to rule out any underlying lesions.  If the colonoscopy is negative, upper endoscopy can be done on the same day to rule out any gastric lesions as well.  However, patient was recently diagnosed with right lower extremity DVT, bilateral PE and is status post IVC filter placement, Dr. Rogue Bussing recommends continuing the Eliquis uninterrupted for at least 4 weeks and then proceeding with colonoscopy after holding the Eliquis for 2 days.  The patient will have to be placed on the urgent endoscopy schedule in 4 to 6 weeks, due to the limited procedure schedule at this time due to the coronavirus pandemic.  We discussed this with the patient and her daughter Jill Bowers in detail and they are agreeable with the above plan.  Follow Up Instructions: Colonoscopy and/or upper endoscopy in 4 to 6 weeks   I discussed the assessment and treatment plan with the patient. The patient was provided an opportunity to ask questions and all were answered. The patient agreed with the plan and demonstrated an understanding of the instructions.   The patient was advised to call back or seek an in-person evaluation if the symptoms worsen or if the condition fails to improve as anticipated.  I provided 6 minutes of non-face-to-face time during this encounter.  Virgel Manifold, MD  Speech recognition software was used to dictate this note.

## 2018-09-25 NOTE — Telephone Encounter (Signed)
-----   Message from Virgel Manifold, MD sent at 09/22/2018 11:53 AM EDT ----- Tati, please schedule Tele visit with me for 3/30 or 3/31. Tati please message me back to let me know once you have scheduled her.   Sharyn Lull or Jackelyn Poling, This patient needs a Colonoscopy in 4-6 weeks per her Oncologist request. No earlier than 4 weeks please. You can work on this after her Tele visit with me. She will need to go on the urgent list for that week once we schedule it. You will also need to send the eliquis clearance request to her oncologist Dr. Aletha Halim office once we schedule her.

## 2018-09-27 ENCOUNTER — Telehealth: Payer: Self-pay

## 2018-09-27 NOTE — Telephone Encounter (Signed)
Pharm please address eliquis thanks 

## 2018-09-27 NOTE — Telephone Encounter (Signed)
   [facility] Pre-operative Risk Assessment    Request for surgical clearance:  1. What type of surgery is being performed? Urgent Colonoscopy  2. When is this surgery scheduled? 4-6 weeks, 10/20/18 being 4 weeks  3. What type of clearance is required (medical clearance vs. Pharmacy clearance to hold med vs. Both)? Pharmacy  4. Are there any medications that need to be held prior to surgery and how long? Eliquis 1m  5. Practice name and name of physician performing surgery? [facility], Dr. VVonda Antigua MD  6. What is your office phone number [phone]  7.   What is your office fax numbe [phone] 8.   Anesthesia type (None, local, MAC, general) ? GENERAL   Jill Lee4/06/2018, 5:01 PM  _________________________________________________________________   (provider comments below)

## 2018-09-28 NOTE — Telephone Encounter (Signed)
   Primary Cardiologist: Ida Rogue, MD  Chart reviewed as part of pre-operative protocol coverage.   Surgeon requests guidance on holding eliquis. Per our pharmacy staff:  "Pt started Eliquis on 09/05/18 due to right lower extremity DVT, also with history of prior DVT, bilateral PE s/p IVC filter. Pt at increased risk of clotting due to current metastatic colon cancer. Has had hx of GI bleed as well.  Eliquis prescribed and managed by oncologist Dr Rogue Bussing. Will need to obtain clearance from her. Ideally would not interrupt anticoagulation at all due to new DVT diagnosed 2 weeks ago."   I will route this recommendation to the requesting party via [facility] fax function and remove from pre-op pool.  Please call with questions.  Tami Lin Nakeia Calvi, PA 09/28/2018, 10:15 AM

## 2018-09-28 NOTE — Telephone Encounter (Signed)
Pt started Eliquis on 09/05/18 due to right lower extremity DVT, also with history of prior DVT, bilateral PE s/p IVC filter. Pt at increased risk of clotting due to current metastatic colon cancer. Has had hx of GI bleed as well.  Eliquis prescribed and managed by oncologist Dr Rogue Bussing. Will need to obtain clearance from her. Ideally would not interrupt anticoagulation at all due to new DVT diagnosed 2 weeks ago.

## 2018-10-02 NOTE — Telephone Encounter (Signed)
I have done this also but did today. I had doctor's name spelled incorrectly and could not find it.  Waiting on that.

## 2018-10-02 NOTE — Telephone Encounter (Signed)
Debbie- please send the clearance on my way. GB

## 2018-10-04 ENCOUNTER — Telehealth: Payer: Self-pay

## 2018-10-04 NOTE — Telephone Encounter (Signed)
Virtual Visit Pre-Appointment Phone Call  Steps For Call:  1. Confirm consent - "In the setting of the current Covid19 crisis, you are scheduled for a (phone or video) visit with your provider on (date) at (time).  Just as we do with many in-office visits, in order for you to participate in this visit, we must obtain consent.  If you'd like, I can send this to your mychart (if signed up) or email for you to review.  Otherwise, I can obtain your verbal consent now.  All virtual visits are billed to your insurance company just like a normal visit would be.  By agreeing to a virtual visit, we'd like you to understand that the technology does not allow for your provider to perform an examination, and thus may limit your provider's ability to fully assess your condition.  Finally, though the technology is pretty good, we cannot assure that it will always work on either your or our end, and in the setting of a video visit, we may have to convert it to a phone-only visit.  In either situation, we cannot ensure that we have a secure connection.  Are you willing to proceed?"  2. Give patient instructions for WebEx download to smartphone as below if video visit  3. Advise patient to be prepared with any vital sign or heart rhythm information, their current medicines, and a piece of paper and pen handy for any instructions they may receive the day of their visit  4. Inform patient they will receive a phone call 15 minutes prior to their appointment time (may be from unknown caller ID) so they should be prepared to answer  5. Confirm that appointment type is correct in Epic appointment notes (video vs telephone)    TELEPHONE CALL NOTE  Jill Bowers has been deemed a candidate for a follow-up tele-health visit to limit community exposure during the Covid-19 pandemic. I spoke with the patient via phone to ensure availability of phone/video source, confirm preferred email & phone number, and discuss  instructions and expectations.  I reminded Jill Bowers to be prepared with any vital sign and/or heart rhythm information that could potentially be obtained via home monitoring, at the time of her visit. I reminded Jill Bowers to expect a phone call at the time of her visit if her visit.  Did the patient verbally acknowledge consent to treatment? YES  Jill Bowers, [place] 10/04/2018 10:48 AM   DOWNLOADING THE [place]  - If Apple, go to [employer] and type in WebEx in the search bar. [place] [employer], the blue/green circle. The app is free but as with any other app downloads, their phone may require them to verify saved payment information or Apple password. The patient does NOT have to create an account.  - If Android, ask patient to go to [employer] and type in WebEx in the search bar. [place] [employer], the blue/green circle. The app is free but as with any other app downloads, their phone may require them to verify saved payment information or Android password. The patient does NOT have to create an account.   CONSENT FOR TELE-HEALTH VISIT - PLEASE REVIEW  I hereby voluntarily request, consent and authorize [facility] and its employed or contracted physicians, physician assistants, nurse practitioners or other licensed health care professionals (the Practitioner), to provide me with telemedicine health care services (the "Services") as deemed necessary by the treating Practitioner. I acknowledge and  consent to receive the Services by the Practitioner via telemedicine. I understand that the telemedicine visit will involve communicating with the Practitioner through live audiovisual communication technology and the disclosure of certain medical information by electronic transmission. I acknowledge that I have been given the opportunity to request an in-person assessment or other available alternative prior to the telemedicine visit and am  voluntarily participating in the telemedicine visit.  I understand that I have the right to withhold or withdraw my consent to the use of telemedicine in the course of my care at any time, without affecting my right to future care or treatment, and that the Practitioner or I may terminate the telemedicine visit at any time. I understand that I have the right to inspect all information obtained and/or recorded in the course of the telemedicine visit and may receive copies of available information for a reasonable fee.  I understand that some of the potential risks of receiving the Services via telemedicine include:  Marland Kitchen Delay or interruption in medical evaluation due to technological equipment failure or disruption; . Information transmitted may not be sufficient (e.g. poor resolution of images) to allow for appropriate medical decision making by the Practitioner; and/or  . In rare instances, security protocols could fail, causing a breach of personal health information.  Furthermore, I acknowledge that it is my responsibility to provide information about my medical history, conditions and care that is complete and accurate to the best of my ability. I acknowledge that Practitioner's advice, recommendations, and/or decision may be based on factors not within their control, such as incomplete or inaccurate data provided by me or distortions of diagnostic images or specimens that may result from electronic transmissions. I understand that the practice of medicine is not an exact science and that Practitioner makes no warranties or guarantees regarding treatment outcomes. I acknowledge that I will receive a copy of this consent concurrently upon execution via email to the email address I last provided but may also request a printed copy by calling the office of [place].    I understand that my insurance will be billed for this visit.   I have read or had this consent read to me. . I understand the  contents of this consent, which adequately explains the benefits and risks of the Services being provided via telemedicine.  . I have been provided ample opportunity to ask questions regarding this consent and the Services and have had my questions answered to my satisfaction. . I give my informed consent for the services to be provided through the use of telemedicine in my medical care  By participating in this telemedicine visit I agree to the above.

## 2018-10-05 NOTE — Telephone Encounter (Signed)
Blood thinner request was faxed to Dr. Burlene Arnt office on 10/04/2018. Confirmation received.

## 2018-10-10 ENCOUNTER — Telehealth: Payer: Self-pay | Admitting: Gastroenterology

## 2018-10-10 NOTE — Progress Notes (Addendum)
Televisit with video/audio    Evaluation Performed:  Follow-up visit  This visit type was conducted due to national recommendations for restrictions regarding the COVID-19 Pandemic (e.g. social distancing) in an effort to limit this patient's exposure and mitigate transmission in our community.  Due to his co-morbid illnesses, this patient is at least at moderate risk for complications without adequate follow up.  This format is felt to be most appropriate for this patient at this time.    All issues noted in this document were discussed and addressed.  No physical exam could be performed with this format.  Please refer to the patient's chart for his  consent to telehealth for [facility].   Date:  10/11/2018   ID:  Jill Bowers, DOB [birth year], MRN [id number]  Patient Location:  [address] [place] [address]   Provider location:   [facility], [place] office  PCP:  Jearld Fenton, NP  Cardiologist:  [facility]  Chief Complaint: Leg swelling, DVT    History of Present Illness:    Jill Bowers is a 85 y.o. female who presents via audio/video conferencing for a telehealth visit today.   The patient does not symptoms concerning for COVID-19 infection (fever, chills, cough, or new SHORTNESS OF BREATH).   Patient has a past medical history of bradycardia,  hospitalized at [facility] for bradycardia,  DVT, PE, IVC filter in place,  prior rectal bleeding, Rectal bleeding, Diverticulosis of large intestine with hemorrhage L anemia,  colon cancer, followed by oncology, stage III, resection 6195 Chronic diastolic CHF CAD and aortic atherosclerosis presents today for follow-up of her bradycardia, shortness of breath  Video conference today, daughter has arranged connection  Discussed recent events,  One month ago DVT, right leg Now on eliquis Swelling little bit better Still with IVC filter Followed by GI, Needs colonoscopy, next month Chronic ABD  pain Below her navel On hydrocodone  Not eating much, drinks ok/good amount losing weight  PET scan, hypermetabiolic area CEA slowly trending upwards from August 2018 value of 36 now 480, 1-1/2 years later  At baseline she has chronic shortness of breath on exertion Relatively disabled Chronic knee pain and chronic back pain Sedentary at baseline, getting weaker Daughter helping to take care of her with some help from family members  Takes Lasix daily Denies any chest pain  Lab work reviewed HA1C 5.8 Total chol 172, LDL 89 Elevated glucose levels in the 120-150 range Creatinine 1.1 CEA climbing up to 480  Other past medical history reviewed In hospital 07/2016 Rectal bleeding, Diverticulosis of large intestine with hemorrhage    Prior CV studies:   The following studies were reviewed today:  Echocardiogram in the hospital  normal ejection fraction, Mention of posterior pericardial effusion   Past Medical History:  Diagnosis Date  . CHF (congestive heart failure) ([place])   . Colon cancer ([place])   . Complication of anesthesia    difficulty waking up afcter colon surgery  . Coronary artery disease   . DVT (deep venous thrombosis) ([place])   . GERD (gastroesophageal reflux disease)   . Hypertension   . Malignant neoplasm of stomach ([place])   . PE (pulmonary embolism)   . Renal disorder   . Renal insufficiency    Past Surgical History:  Procedure Laterality Date  . ABDOMINAL HYSTERECTOMY    . COLON SURGERY    . COLONOSCOPY N/A 02/01/2018   Procedure: COLONOSCOPY;  Surgeon: Virgel Manifold, MD;  Location: [facility] ENDOSCOPY;  Service: Endoscopy;  Laterality: N/A;  . COLONOSCOPY WITH PROPOFOL N/A 08/23/2016   Procedure: COLONOSCOPY WITH PROPOFOL;  Surgeon: Jonathon Bellows, MD;  Location: [facility] ENDOSCOPY;  Service: Gastroenterology;  Laterality: N/A;  . COLONOSCOPY WITH PROPOFOL N/A 08/24/2016   Procedure: COLONOSCOPY WITH PROPOFOL;  Surgeon: Jonathon Bellows, MD;  Location: [facility]  ENDOSCOPY;  Service: Endoscopy;  Laterality: N/A;  . ESOPHAGOGASTRODUODENOSCOPY N/A 02/01/2018   Procedure: ESOPHAGOGASTRODUODENOSCOPY (EGD);  Surgeon: Virgel Manifold, MD;  Location[facility] ENDOSCOPY;  Service: Endoscopy;  Laterality: N/A;  . ESOPHAGOGASTRODUODENOSCOPY (EGD) WITH PROPOFOL N/A 03/29/2018   Procedure: ESOPHAGOGASTRODUODENOSCOPY (EGD) WITH PROPOFOL;  Surgeon: Virgel Manifold, MD;  Location: [facility] ENDOSCOPY;  Service: Endoscopy;  Laterality: N/A;  . IVC FILTER PLACEMENT ([facility])    . JOINT REPLACEMENT Right 2000   knee     Current Meds  Medication Sig  . acetaminophen (TYLENOL) 650 MG CR tablet Take 650 mg by mouth every 8 (eight) hours as needed for pain.  Marland Kitchen apixaban (ELIQUIS) 5 MG TABS tablet Take 1 tablet (5 mg total) by mouth 2 (two) times daily.  . Cholecalciferol (VITAMIN D-3) 1000 UNITS CAPS Take 2 capsules by mouth daily.  . COMBIGAN 0.2-0.5 % ophthalmic solution Place 1 drop into both eyes 2 (two) times daily.  . furosemide (LASIX) 40 MG tablet TAKE 1 TABLET BY MOUTH EVERY DAY  . Magnesium Oxide 500 MG TABS Take 1 tablet by mouth 2 (two) times daily.  Marland Kitchen MEGARED OMEGA-3 KRILL OIL 500 MG CAPS Take by mouth.  Marland Kitchen omeprazole (PRILOSEC) 20 MG capsule Take 20 mg by mouth daily.  Marland Kitchen oxyCODONE-acetaminophen (PERCOCET) 10-325 MG tablet Take 1 tablet by mouth every 8 (eight) hours as needed for pain.  . Polyethylene Glycol 3350 (MIRALAX PO) Take by mouth daily.  Marland Kitchen spironolactone (ALDACTONE) 25 MG tablet TAKE 1 TABLET (25 MG TOTAL) BY MOUTH 2 (TWO) TIMES DAILY AS NEEDED.  Marland Kitchen vitamin C (ASCORBIC ACID) 500 MG tablet Take 500 mg by mouth daily.     Allergies:   Gabapentin; Reglan [metoclopramide]; Statins; and Torsemide   Social History   Tobacco Use  . Smoking status: Never Smoker  . Smokeless tobacco: Never Used  Substance Use Topics  . Alcohol use: No  . Drug use: No     Current Outpatient Medications on File Prior to Visit  Medication Sig Dispense Refill  .  acetaminophen (TYLENOL) 650 MG CR tablet Take 650 mg by mouth every 8 (eight) hours as needed for pain.    Marland Kitchen apixaban (ELIQUIS) 5 MG TABS tablet Take 1 tablet (5 mg total) by mouth 2 (two) times daily. 60 tablet 5  . Cholecalciferol (VITAMIN D-3) 1000 UNITS CAPS Take 2 capsules by mouth daily.    . COMBIGAN 0.2-0.5 % ophthalmic solution Place 1 drop into both eyes 2 (two) times daily.  4  . furosemide (LASIX) 40 MG tablet TAKE 1 TABLET BY MOUTH EVERY DAY 30 tablet 0  . Magnesium Oxide 500 MG TABS Take 1 tablet by mouth 2 (two) times daily.    Marland Kitchen MEGARED OMEGA-3 KRILL OIL 500 MG CAPS Take by mouth.    Marland Kitchen omeprazole (PRILOSEC) 20 MG capsule Take 20 mg by mouth daily.    Marland Kitchen oxyCODONE-acetaminophen (PERCOCET) 10-325 MG tablet Take 1 tablet by mouth every 8 (eight) hours as needed for pain. 90 tablet 0  . Polyethylene Glycol 3350 (MIRALAX PO) Take by mouth daily.    Marland Kitchen spironolactone (ALDACTONE) 25 MG tablet TAKE 1 TABLET (  25 MG TOTAL) BY MOUTH 2 (TWO) TIMES DAILY AS NEEDED. 60 tablet 0  . vitamin C (ASCORBIC ACID) 500 MG tablet Take 500 mg by mouth daily.     No current facility-administered medications on file prior to visit.      Family Hx: The patient's family history includes Arthritis in her mother; Colon cancer in her sister and son; Heart disease in her daughter; Hypertension in her mother; Pancreatic cancer in her son.  ROS:   Please see the history of present illness.    Review of Systems  Constitutional: Negative.   Respiratory: Negative.   Cardiovascular: Negative.   Gastrointestinal: Positive for abdominal pain.  Musculoskeletal: Negative.        Leg weakness  Neurological: Negative.   Psychiatric/Behavioral: Negative.   All other systems reviewed and are negative.     Labs/Other Tests and Data Reviewed:    Recent Labs: 01/31/2018: Magnesium 1.9 09/05/2018: ALT 7; BUN 16; Creatinine, Ser 1.10; Hemoglobin 12.2; Platelets 235; Potassium 3.6; Sodium 140   Recent Lipid Panel Lab  Results  Component Value Date/Time   CHOL 172 06/12/2015 11:25 AM   TRIG 155.0 (H) 06/12/2015 11:25 AM   HDL 52.60 06/12/2015 11:25 AM   CHOLHDL 3 06/12/2015 11:25 AM   LDLCALC 89 06/12/2015 11:25 AM    Wt Readings from Last 3 Encounters:  03/29/18 206 lb (93.4 kg)  03/13/18 206 lb 9.6 oz (93.7 kg)  02/24/18 205 lb (93 kg)     Exam:    Vital Signs: Vital signs may also be detailed in the HPI There were no vitals taken for this visit.  Wt Readings from Last 3 Encounters:  03/29/18 206 lb (93.4 kg)  03/13/18 206 lb 9.6 oz (93.7 kg)  02/24/18 205 lb (93 kg)   Temp Readings from Last 3 Encounters:  09/22/18 (!) 97.1 F (36.2 C) (Tympanic)  09/05/18 98.1 F (36.7 C) (Tympanic)  03/29/18 (!) 97.3 F (36.3 C)   BP Readings from Last 3 Encounters:  09/22/18 133/70  09/05/18 (!) 155/84  03/29/18 125/68   Pulse Readings from Last 3 Encounters:  09/22/18 79  09/05/18 69  03/29/18 66    Vitals 130/70, heart rate 70, weight 206 Respirations 16  Webcam visit, patient seen, examined Well nourished, well developed female in no acute distress.  Laying on the bed Constitutional:  oriented to person, place, and time. No distress.  Head: Normocephalic and atraumatic.  Eyes:  no discharge. No scleral icterus.  Neck: Normal range of motion. Neck supple.  Pulmonary/Chest: No audible wheezing, no distress, appears comfortable Musculoskeletal: Normal range of motion.  no  tenderness or deformity.  Neurological:   Coordination normal. Full exam not performed Skin:  No rash Psychiatric:  normal mood and affect. behavior is normal. Thought content normal.    ASSESSMENT & PLAN:    Chronic diastolic congestive heart failure (HCC) Recommend she stay on Lasix daily given high fluid intake Family denies significant shortness of breath, leg swelling We will continue to monitor closely  PAD (peripheral artery disease) ([place])  Essential hypertension Blood pressure is well controlled  on today's visit. No changes made to the medications.  Symptomatic bradycardia  CKD (chronic kidney disease) stage 3, GFR 30-59 ml/min (HCC) Creatinine at reasonable range 1.1 Family will hold diuretics for any period of time with poor fluid intake  Acute deep vein thrombosis (DVT) of proximal vein of lower extremity, unspecified laterality  On Eliquis 5 twice daily Prior bleeding history  COVID-19 Education: The signs and symptoms of COVID-19 were discussed with the patient and how to seek care for testing (follow up with PCP or arrange E-visit).  The importance of social distancing was discussed today.  Patient Risk:   After full review of this patients clinical status, I feel that they are at least moderate risk at this time.  Time:   Today, I have spent 25 minutes with the patient with telehealth technology discussing the cardiac and medical problems/diagnoses detailed above     Medication Adjustments/Labs and Tests Ordered: Current medicines are reviewed at length with the patient today.  Concerns regarding medicines are outlined above.   Tests Ordered: No tests ordered   Medication Changes: No changes made   Disposition: Follow-up in 12 months   Signed, Ida Rogue, MD  10/11/2018 4:42 PM    [place] Office [address] [place] [address], [place], [place] [address]

## 2018-10-10 NOTE — Telephone Encounter (Signed)
Received return fax on patient for clearance for medication before colonoscopy. I have placed it in your box.

## 2018-10-11 ENCOUNTER — Telehealth (INDEPENDENT_AMBULATORY_CARE_PROVIDER_SITE_OTHER): Payer: Medicare Other | Admitting: Cardiovascular Disease

## 2018-10-11 ENCOUNTER — Other Ambulatory Visit: Payer: Self-pay

## 2018-10-11 DIAGNOSIS — I5032 Chronic diastolic (congestive) heart failure: Secondary | ICD-10-CM

## 2018-10-11 DIAGNOSIS — R1084 Generalized abdominal pain: Secondary | ICD-10-CM

## 2018-10-11 DIAGNOSIS — I10 Essential (primary) hypertension: Secondary | ICD-10-CM

## 2018-10-11 DIAGNOSIS — N183 Chronic kidney disease, stage 3 unspecified: Secondary | ICD-10-CM

## 2018-10-11 DIAGNOSIS — R001 Bradycardia, unspecified: Secondary | ICD-10-CM

## 2018-10-11 DIAGNOSIS — I824Y9 Acute embolism and thrombosis of unspecified deep veins of unspecified proximal lower extremity: Secondary | ICD-10-CM

## 2018-10-11 DIAGNOSIS — I2510 Atherosclerotic heart disease of native coronary artery without angina pectoris: Secondary | ICD-10-CM | POA: Diagnosis not present

## 2018-10-11 DIAGNOSIS — I739 Peripheral vascular disease, unspecified: Secondary | ICD-10-CM

## 2018-10-11 NOTE — Patient Instructions (Signed)

## 2018-10-13 ENCOUNTER — Other Ambulatory Visit: Payer: Self-pay

## 2018-10-13 ENCOUNTER — Other Ambulatory Visit: Payer: Self-pay | Admitting: Cardiovascular Disease

## 2018-10-16 ENCOUNTER — Telehealth: Payer: Self-pay

## 2018-10-16 NOTE — Telephone Encounter (Signed)
Left message with pt's daughter to contact office. We have blood thinner request and pt's Eliquis is to be held 3 days prior to procedure and restart the day after procedure. We can proceed with colonoscopy. Will need to schedule.

## 2018-10-16 NOTE — Telephone Encounter (Signed)
Noted  

## 2018-10-17 ENCOUNTER — Other Ambulatory Visit: Payer: Self-pay

## 2018-10-17 DIAGNOSIS — C772 Secondary and unspecified malignant neoplasm of intra-abdominal lymph nodes: Secondary | ICD-10-CM

## 2018-10-17 DIAGNOSIS — C189 Malignant neoplasm of colon, unspecified: Secondary | ICD-10-CM

## 2018-10-17 MED ORDER — NA SULFATE-K SULFATE-MG SULF 17.5-3.13-1.6 GM/177ML PO SOLN: 1.0000 | Freq: Once | ORAL | 0 refills | Status: AC

## 2018-10-17 MED ORDER — OXYCODONE-ACETAMINOPHEN 10-325 MG PO TABS: 1.0000 | ORAL_TABLET | Freq: Three times a day (TID) | ORAL | 0 refills | Status: AC | PRN

## 2018-10-17 NOTE — Telephone Encounter (Signed)
I spoke with pt's daughter, [name] and went over prep instructions for colonoscopy scheduled for 10/26/2018 at [facility] by Dr. Marius Ditch per Dr. Bonna Gains. (Colonoscopy is urgent-Dr. Liana Crocker, MD recommended related to Colon Cancer, stage III, metastasized to intra-abdominal lymph noted, elevated CEA, intake in the sigmoid area (seen on PET scan). Refer to notes of 09/22/18.  Also informed daughter that her Eliquis is to be withheld 3 days prior to procedure and started back the day after. To contact office for any questions.

## 2018-10-17 NOTE — Telephone Encounter (Signed)
Name of Medication: oxycodone apap 10-325 mg Name of Pharmacy:[facility] or Written Date and Quantity: # 81 on 08/23/18 Last Office Visit and Type:02/14/18 annual  Next Office Visit and Type: none Last Controlled Substance Agreement Date: 08/09/17 Last UDS:08/09/17

## 2018-10-17 NOTE — Telephone Encounter (Signed)
[name] (daughter)called returning Debbie's call & is ready schedule the colonoscopy for the patient.

## 2018-10-26 ENCOUNTER — Telehealth: Payer: Self-pay | Admitting: Internal Medicine

## 2018-10-26 ENCOUNTER — Ambulatory Visit: Payer: Medicare Other | Admitting: Anesthesiology

## 2018-10-26 ENCOUNTER — Ambulatory Visit
Admission: RE | Admit: 2018-10-26 | Discharge: 2018-10-26 | Disposition: A | Discharge status: Home / Self Care | Payer: Medicare Other | Attending: Gastroenterology | Admitting: Gastroenterology

## 2018-10-26 ENCOUNTER — Encounter
Admission: RE | Disposition: A | Discharge status: Home / Self Care | Payer: Self-pay | Source: Home / Self Care | Attending: Gastroenterology

## 2018-10-26 ENCOUNTER — Encounter: Payer: Self-pay | Admitting: *Deleted

## 2018-10-26 DIAGNOSIS — Z96651 Presence of right artificial knee joint: Secondary | ICD-10-CM | POA: Diagnosis not present

## 2018-10-26 DIAGNOSIS — K633 Ulcer of intestine: Secondary | ICD-10-CM | POA: Insufficient documentation

## 2018-10-26 DIAGNOSIS — Z86718 Personal history of other venous thrombosis and embolism: Secondary | ICD-10-CM | POA: Insufficient documentation

## 2018-10-26 DIAGNOSIS — K219 Gastro-esophageal reflux disease without esophagitis: Secondary | ICD-10-CM | POA: Diagnosis not present

## 2018-10-26 DIAGNOSIS — D490 Neoplasm of unspecified behavior of digestive system: Secondary | ICD-10-CM | POA: Diagnosis not present

## 2018-10-26 DIAGNOSIS — C189 Malignant neoplasm of colon, unspecified: Secondary | ICD-10-CM

## 2018-10-26 DIAGNOSIS — Z90710 Acquired absence of both cervix and uterus: Secondary | ICD-10-CM | POA: Diagnosis not present

## 2018-10-26 DIAGNOSIS — C772 Secondary and unspecified malignant neoplasm of intra-abdominal lymph nodes: Secondary | ICD-10-CM

## 2018-10-26 DIAGNOSIS — Z8249 Family history of ischemic heart disease and other diseases of the circulatory system: Secondary | ICD-10-CM | POA: Diagnosis not present

## 2018-10-26 DIAGNOSIS — K5669 Other partial intestinal obstruction: Secondary | ICD-10-CM

## 2018-10-26 DIAGNOSIS — M1990 Unspecified osteoarthritis, unspecified site: Secondary | ICD-10-CM | POA: Diagnosis not present

## 2018-10-26 DIAGNOSIS — K5730 Diverticulosis of large intestine without perforation or abscess without bleeding: Secondary | ICD-10-CM | POA: Diagnosis not present

## 2018-10-26 DIAGNOSIS — Z85028 Personal history of other malignant neoplasm of stomach: Secondary | ICD-10-CM | POA: Insufficient documentation

## 2018-10-26 DIAGNOSIS — N289 Disorder of kidney and ureter, unspecified: Secondary | ICD-10-CM | POA: Insufficient documentation

## 2018-10-26 DIAGNOSIS — I110 Hypertensive heart disease with heart failure: Secondary | ICD-10-CM | POA: Diagnosis not present

## 2018-10-26 DIAGNOSIS — K6389 Other specified diseases of intestine: Secondary | ICD-10-CM

## 2018-10-26 DIAGNOSIS — K644 Residual hemorrhoidal skin tags: Secondary | ICD-10-CM | POA: Diagnosis not present

## 2018-10-26 DIAGNOSIS — Z86711 Personal history of pulmonary embolism: Secondary | ICD-10-CM | POA: Diagnosis not present

## 2018-10-26 DIAGNOSIS — C187 Malignant neoplasm of sigmoid colon: Secondary | ICD-10-CM | POA: Diagnosis not present

## 2018-10-26 DIAGNOSIS — R933 Abnormal findings on diagnostic imaging of other parts of digestive tract: Secondary | ICD-10-CM

## 2018-10-26 DIAGNOSIS — Z888 Allergy status to other drugs, medicaments and biological substances status: Secondary | ICD-10-CM | POA: Diagnosis not present

## 2018-10-26 DIAGNOSIS — Z800 Family history of malignant neoplasm of digestive organs: Secondary | ICD-10-CM | POA: Insufficient documentation

## 2018-10-26 DIAGNOSIS — I509 Heart failure, unspecified: Secondary | ICD-10-CM | POA: Insufficient documentation

## 2018-10-26 DIAGNOSIS — Z8261 Family history of arthritis: Secondary | ICD-10-CM | POA: Diagnosis not present

## 2018-10-26 DIAGNOSIS — Z79899 Other long term (current) drug therapy: Secondary | ICD-10-CM | POA: Insufficient documentation

## 2018-10-26 DIAGNOSIS — K648 Other hemorrhoids: Secondary | ICD-10-CM | POA: Diagnosis not present

## 2018-10-26 DIAGNOSIS — Z85038 Personal history of other malignant neoplasm of large intestine: Secondary | ICD-10-CM

## 2018-10-26 DIAGNOSIS — I2510 Atherosclerotic heart disease of native coronary artery without angina pectoris: Secondary | ICD-10-CM | POA: Insufficient documentation

## 2018-10-26 HISTORY — PX: COLONOSCOPY WITH PROPOFOL: SHX5780

## 2018-10-26 SURGERY — COLONOSCOPY WITH PROPOFOL
Anesthesia: General

## 2018-10-26 MED ORDER — PROPOFOL 10 MG/ML IV BOLUS
INTRAVENOUS | Status: DC | PRN
  Administered 2018-10-26: 80 mg via INTRAVENOUS

## 2018-10-26 MED ORDER — LIDOCAINE HCL (CARDIAC) PF 100 MG/5ML IV SOSY
PREFILLED_SYRINGE | INTRAVENOUS | Status: DC | PRN
  Administered 2018-10-26: 60 mg via INTRAVENOUS

## 2018-10-26 MED ORDER — PROPOFOL 500 MG/50ML IV EMUL
INTRAVENOUS | Status: DC | PRN
  Administered 2018-10-26: 100 ug/kg/min via INTRAVENOUS

## 2018-10-26 MED ORDER — SPOT INK MARKER SYRINGE KIT
PACK | SUBMUCOSAL | Status: DC | PRN
  Administered 2018-10-26: 2 mL via SUBMUCOSAL

## 2018-10-26 MED ORDER — LIDOCAINE HCL (PF) 1 % IJ SOLN
INTRAMUSCULAR | Status: AC
  Filled 2018-10-26: qty 2

## 2018-10-26 MED ORDER — PROPOFOL 500 MG/50ML IV EMUL
INTRAVENOUS | Status: AC
  Filled 2018-10-26: qty 50

## 2018-10-26 MED ORDER — LIDOCAINE HCL (PF) 2 % IJ SOLN
INTRAMUSCULAR | Status: AC
  Filled 2018-10-26: qty 10

## 2018-10-26 MED ORDER — SODIUM CHLORIDE 0.9 % IV SOLN
INTRAVENOUS | Status: DC
  Administered 2018-10-26: 1000 mL via INTRAVENOUS

## 2018-10-26 MED ORDER — PHENYLEPHRINE HCL (PRESSORS) 10 MG/ML IV SOLN
INTRAVENOUS | Status: AC
  Filled 2018-10-26: qty 1

## 2018-10-26 NOTE — Anesthesia Preprocedure Evaluation (Signed)
Anesthesia Evaluation  Patient identified by MRN, date of birth, ID band Patient awake and Patient confused    Reviewed: Allergy & Precautions, H&P , NPO status , Patient's Chart, lab work & pertinent test results  History of Anesthesia Complications (+) PROLONGED EMERGENCE and history of anesthetic complications  Airway Mallampati: III  TM Distance: >3 FB Neck ROM: limited    Dental  (+) Poor Dentition, Missing, Edentulous Lower, Edentulous Upper   Pulmonary neg pulmonary ROS, neg shortness of breath,           Cardiovascular hypertension, (-) angina+ CAD, + Past MI and +CHF       Neuro/Psych negative neurological ROS  negative psych ROS   GI/Hepatic Neg liver ROS, GERD  Medicated and Controlled,  Endo/Other  negative endocrine ROS  Renal/GU Renal disease  negative genitourinary   Musculoskeletal  (+) Arthritis ,   Abdominal   Peds  Hematology negative hematology ROS (+)   Anesthesia Other Findings Past Medical History: No date: CHF (congestive heart failure) (HCC) No date: Colon cancer (HCC) No date: Complication of anesthesia     Comment:  difficulty waking up afcter colon surgery No date: Coronary artery disease No date: DVT (deep venous thrombosis) (HCC) No date: GERD (gastroesophageal reflux disease) No date: Hypertension No date: Malignant neoplasm of stomach (HCC) No date: PE (pulmonary embolism) No date: Renal disorder No date: Renal insufficiency  Past Surgical History: No date: ABDOMINAL HYSTERECTOMY No date: COLON SURGERY 02/01/2018: COLONOSCOPY; N/A     Comment:  Procedure: COLONOSCOPY;  Surgeon: Virgel Manifold,               MD;  Location: [facility] ENDOSCOPY;  Service: Endoscopy;                Laterality: N/A; 08/23/2016: COLONOSCOPY WITH PROPOFOL; N/A     Comment:  Procedure: COLONOSCOPY WITH PROPOFOL;  Surgeon: Jonathon Bellows, MD;  Location: [facility] ENDOSCOPY;  Service:        Gastroenterology;  Laterality: N/A; 08/24/2016: COLONOSCOPY WITH PROPOFOL; N/A     Comment:  Procedure: COLONOSCOPY WITH PROPOFOL;  Surgeon: Jonathon Bellows, MD;  Location: [facility] ENDOSCOPY;  Service: Endoscopy;              Laterality: N/A; 02/01/2018: ESOPHAGOGASTRODUODENOSCOPY; N/A     Comment:  Procedure: ESOPHAGOGASTRODUODENOSCOPY (EGD);  Surgeon:               Virgel Manifold, MD;  Location: [facility] ENDOSCOPY;                Service: Endoscopy;  Laterality: N/A; 03/29/2018: ESOPHAGOGASTRODUODENOSCOPY (EGD) WITH PROPOFOL; N/A     Comment:  Procedure: ESOPHAGOGASTRODUODENOSCOPY (EGD) WITH               PROPOFOL;  Surgeon: Virgel Manifold, MD;  Location:               [facility] ENDOSCOPY;  Service: Endoscopy;  Laterality: N/A; No date: IVC FILTER PLACEMENT ([facility] HX) 2000: JOINT REPLACEMENT; Right     Comment:  knee     Reproductive/Obstetrics negative OB ROS                             Anesthesia Physical Anesthesia Plan  ASA: IV  Anesthesia Plan: General   Post-op Pain  Management:    Induction: Intravenous  PONV Risk Score and Plan: Propofol infusion and TIVA  Airway Management Planned: Natural Airway and Nasal Cannula  Additional Equipment:   Intra-op Plan:   Post-operative Plan:   Informed Consent: I have reviewed the patients History and Physical, chart, labs and discussed the procedure including the risks, benefits and alternatives for the proposed anesthesia with the patient or authorized representative who has indicated his/her understanding and acceptance.     Dental Advisory Given  Plan Discussed with: Anesthesiologist, CRNA and Surgeon  Anesthesia Plan Comments: (Patient and daughter consented for risks of anesthesia including but not limited to:  - adverse reactions to medications - risk of intubation if required - damage to teeth, lips or other oral mucosa - sore throat or hoarseness - Damage to heart, brain,  lungs or loss of life  They voiced understanding.)        Anesthesia Quick Evaluation

## 2018-10-26 NOTE — Op Note (Signed)
[facility] Patient Name: Jill Bowers Procedure Date: 10/26/2018 9:34 AM MRN: [id number] Account #: [id number] Date of Birth: 11-22-1928 Admit Type: Outpatient Age: 85 Room: [facility] ENDO ROOM 4 Gender: Female Note Status: Finalized Procedure:            Colonoscopy Indications:          Personal history of malignant neoplasm of the colon,                        Abnormal PET scan of the GI tract, Rising CEA levels Providers:            Lin Landsman MD, MD Referring MD:         Jearld Fenton (Referring MD) Medicines:            Monitored Anesthesia Care Complications:        No immediate complications. Estimated blood loss: None. Procedure:            Pre-Anesthesia Assessment:                       - Prior to the procedure, a History and Physical was                        performed, and patient medications and allergies were                        reviewed. The patient is competent. The risks and                        benefits of the procedure and the sedation options and                        risks were discussed with the patient. All questions                        were answered and informed consent was obtained.                        Patient identification and proposed procedure were                        verified by the physician, the nurse, the                        anesthesiologist, the anesthetist and the technician in                        the pre-procedure area in the procedure room in the                        endoscopy suite. Mental Status Examination: alert and                        oriented. Airway Examination: normal oropharyngeal                        airway and neck mobility. Respiratory Examination:                        clear to auscultation. CV Examination:  normal.                        Prophylactic Antibiotics: The patient does not require                        prophylactic antibiotics. Prior Anticoagulants:  The                        patient has taken Eliquis (apixaban), last dose was 3                        days prior to procedure. ASA Grade Assessment: IV - A                        patient with severe systemic disease that is a constant                        threat to life. After reviewing the risks and benefits,                        the patient was deemed in satisfactory condition to                        undergo the procedure. The anesthesia plan was to use                        monitored anesthesia care (MAC). Immediately prior to                        administration of medications, the patient was                        re-assessed for adequacy to receive sedatives. The                        heart rate, respiratory rate, oxygen saturations, blood                        pressure, adequacy of pulmonary ventilation, and                        response to care were monitored throughout the                        procedure. The physical status of the patient was                        re-assessed after the procedure.                       After obtaining informed consent, the colonoscope was                        passed under direct vision. Throughout the procedure,                        the patient's blood pressure, pulse, and oxygen  saturations were monitored continuously. The                        Colonoscope was introduced through the anus and                        advanced to the the sigmoid colon to examine a mass.                        This was the intended extent. The colonoscopy was                        extremely difficult due to a partially obstructing                        mass. The patient tolerated the procedure well. The                        quality of the bowel preparation was excellent. Findings:      The perianal and digital rectal examinations were normal. Pertinent       negatives include normal sphincter tone and no palpable rectal  lesions.      An infiltrative and ulcerated partially obstructing large mass was found       in the sigmoid colon and at 20 cm proximal to the anus. The mass was       circumferential. No bleeding was present. This was biopsied with a cold       forceps for histology. Area was tattooed with an injection of Spot       (carbon black).      Multiple diverticula were found in the sigmoid colon. There was no       evidence of diverticular bleeding.      Non-bleeding external and internal hemorrhoids were found during       retroflexion. The hemorrhoids were large. Impression:           - Likely malignant partially obstructing tumor in the                        sigmoid colon and at 20 cm proximal to the anus.                        Biopsied. Tattooed.                       - Severe diverticulosis in the sigmoid colon. There was                        no evidence of diverticular bleeding.                       - Non-bleeding external and internal hemorrhoids. Recommendation:       - Discharge patient to home (with escort).                       - Resume previous diet today.                       - Await pathology results.                       -  Resume Eliquis (apixaban) at prior dose today. Refer                        to primary physician for further adjustment of therapy.                       - Follow up with Oncologist Procedure Code(s):    --- Professional ---                       [phone], 23, Colonoscopy, flexible; with directed                        submucosal injection(s), any substance                       45380, 77, Colonoscopy, flexible; with biopsy, single                        or multiple Diagnosis Code(s):    --- Professional ---                       D49.0, Neoplasm of unspecified behavior of digestive                        system                       K56.690, Other partial intestinal obstruction                       K64.8, Other hemorrhoids                        Z85.038, Personal history of other malignant neoplasm                        of large intestine                       K57.30, Diverticulosis of large intestine without                        perforation or abscess without bleeding                       R93.3, Abnormal findings on diagnostic imaging of other                        parts of digestive tract CPT copyright 2019 American Medical Association. All rights reserved. The codes documented in this report are preliminary and upon coder review may  be revised to meet current compliance requirements. Dr. Ulyess Mort Lin Landsman MD, MD 10/26/2018 10:11:07 AM This report has been signed electronically. Number of Addenda: 0 Note Initiated On: 10/26/2018 9:34 AM Scope Withdrawal Time: 0 hours 7 minutes 31 seconds  Total Procedure Duration: 0 hours 11 minutes 36 seconds       [facility]

## 2018-10-26 NOTE — Transfer of Care (Signed)
Immediate Anesthesia Transfer of Care Note  Patient: Jill Bowers  Procedure(s) Performed: COLONOSCOPY WITH PROPOFOL (N/A )  Patient Location: Endoscopy Unit  Anesthesia Type:General  Level of Consciousness: drowsy and patient cooperative  Airway & Oxygen Therapy: Patient Spontanous Breathing  Post-op Assessment: Report given to RN and Post -op Vital signs reviewed and stable  Post vital signs: Reviewed and stable  Last Vitals:  Vitals Value Taken Time  BP 103/64 10/26/2018 10:06 AM  Temp    Pulse 74 10/26/2018 10:06 AM  Resp 16 10/26/2018 10:06 AM  SpO2 100 % 10/26/2018 10:06 AM  Vitals shown include unvalidated device data.  Last Pain:  Vitals:   10/26/18 0858  TempSrc:   PainSc: 10-Worst pain ever         Complications: No apparent anesthesia complications

## 2018-10-26 NOTE — H&P (Signed)
Cephas Darby, MD [address]  [place]  [place], [place] [address]  Main: [phone]  Fax: [phone] Pager: [phone]  Primary Care Physician:  Jearld Fenton, NP Primary Gastroenterologist:  Dr. Cephas Darby  Pre-Procedure History & Physical: HPI:  Jill Bowers is a 85 y.o. female is here for an colonoscopy.   Past Medical History:  Diagnosis Date  . CHF (congestive heart failure) ([place])   . Colon cancer ([place])   . Complication of anesthesia    difficulty waking up afcter colon surgery  . Coronary artery disease   . DVT (deep venous thrombosis) ([place])   . GERD (gastroesophageal reflux disease)   . Hypertension   . Malignant neoplasm of stomach ([place])   . PE (pulmonary embolism)   . Renal disorder   . Renal insufficiency     Past Surgical History:  Procedure Laterality Date  . ABDOMINAL HYSTERECTOMY    . COLON SURGERY    . COLONOSCOPY N/A 02/01/2018   Procedure: COLONOSCOPY;  Surgeon: Virgel Manifold, MD;  Location: [facility] ENDOSCOPY;  Service: Endoscopy;  Laterality: N/A;  . COLONOSCOPY WITH PROPOFOL N/A 08/23/2016   Procedure: COLONOSCOPY WITH PROPOFOL;  Surgeon: Jonathon Bellows, MD;  Location: [facility] ENDOSCOPY;  Service: Gastroenterology;  Laterality: N/A;  . COLONOSCOPY WITH PROPOFOL N/A 08/24/2016   Procedure: COLONOSCOPY WITH PROPOFOL;  Surgeon: Jonathon Bellows, MD;  Location: [facility] ENDOSCOPY;  Service: Endoscopy;  Laterality: N/A;  . ESOPHAGOGASTRODUODENOSCOPY N/A 02/01/2018   Procedure: ESOPHAGOGASTRODUODENOSCOPY (EGD);  Surgeon: Virgel Manifold, MD;  Location: [facility] ENDOSCOPY;  Service: Endoscopy;  Laterality: N/A;  . ESOPHAGOGASTRODUODENOSCOPY (EGD) WITH PROPOFOL N/A 03/29/2018   Procedure: ESOPHAGOGASTRODUODENOSCOPY (EGD) WITH PROPOFOL;  Surgeon: Virgel Manifold, MD;  Location: [facility] ENDOSCOPY;  Service: Endoscopy;  Laterality: N/A;  . IVC FILTER PLACEMENT ([place] HX)    . JOINT REPLACEMENT Right 2000   knee    Prior to Admission medications    Medication Sig Start Date End Date Taking? Authorizing Provider  acetaminophen (TYLENOL) 650 MG CR tablet Take 650 mg by mouth every 8 (eight) hours as needed for pain.    [provider]  apixaban (ELIQUIS) 5 MG TABS tablet Take 1 tablet (5 mg total) by mouth 2 (two) times daily. 09/05/18   Cammie Sickle, MD  Cholecalciferol (VITAMIN D-3) 1000 UNITS CAPS Take 2 capsules by mouth daily.    [provider]  COMBIGAN 0.2-0.5 % ophthalmic solution Place 1 drop into both eyes 2 (two) times daily. 11/30/17   [provider]  furosemide (LASIX) 40 MG tablet TAKE 1 TABLET (40 MG TOTAL) BY MOUTH 2 (TWO) TIMES DAILY AS NEEDED. 10/13/18   Minna Merritts, MD  Magnesium Oxide 500 MG TABS Take 1 tablet by mouth 2 (two) times daily.    [provider]  MEGARED OMEGA-3 KRILL OIL 500 MG CAPS Take by mouth.    [provider]  omeprazole (PRILOSEC) 20 MG capsule Take 20 mg by mouth daily.    [provider]  oxyCODONE-acetaminophen (PERCOCET) 10-325 MG tablet Take 1 tablet by mouth every 8 (eight) hours as needed for pain. 10/17/18   Jearld Fenton, NP  Polyethylene Glycol 3350 (MIRALAX PO) Take by mouth daily.    [provider]  spironolactone (ALDACTONE) 25 MG tablet TAKE 1 TABLET (25 MG TOTAL) BY MOUTH 2 (TWO) TIMES DAILY AS NEEDED. 03/27/18   Gollan, Kathlene November, MD  vitamin C (ASCORBIC ACID) 500 MG tablet Take 500 mg by mouth daily.  [provider]    Allergies as of 10/17/2018 - Review Complete 10/11/2018  Allergen Reaction Noted  . Gabapentin Other (See Comments) 09/16/2016  . Reglan [metoclopramide] Other (See Comments) 05/16/2015  . Statins Other (See Comments) 05/16/2015  . Torsemide Other (See Comments) 05/16/2015    Family History  Problem Relation Age of Onset  . Arthritis Mother   . Hypertension Mother   . Colon cancer Sister   . Heart disease Daughter   . Colon cancer Son   . Pancreatic cancer Son      Social History   Socioeconomic History  . Marital status: Widowed    Spouse name: Not on file  . Number of children: Not on file  . Years of education: Not on file  . Highest education level: Not on file  Occupational History  . Not on file  Social Needs  . Financial resource strain: Not on file  . Food insecurity:    Worry: Not on file    Inability: Not on file  . Transportation needs:    Medical: Not on file    Non-medical: Not on file  Tobacco Use  . Smoking status: Never Smoker  . Smokeless tobacco: Never Used  Substance and Sexual Activity  . Alcohol use: No  . Drug use: No  . Sexual activity: Never  Lifestyle  . Physical activity:    Days per week: Not on file    Minutes per session: Not on file  . Stress: Not on file  Relationships  . Social connections:    Talks on phone: Not on file    Gets together: Not on file    Attends religious service: Not on file    Active member of club or organization: Not on file    Attends meetings of clubs or organizations: Not on file    Relationship status: Not on file  . Intimate partner violence:    Fear of current or ex partner: Not on file    Emotionally abused: Not on file    Physically abused: Not on file    Forced sexual activity: Not on file  Other Topics Concern  . Not on file  Social History Narrative   Lives at home with daughter, has a walker    Review of Systems: See HPI, otherwise negative ROS  Physical Exam: BP (!) 148/68   Pulse 68   Temp 99 F (37.2 C) (Tympanic)   Resp 20   Ht 5\' 3"  (1.6 m)   Wt 135.2 kg   SpO2 100%   BMI 52.79 kg/m  General:   Alert,  pleasant and cooperative in NAD Head:  Normocephalic and atraumatic. Neck:  Supple; no masses or thyromegaly. Lungs:  Clear throughout to auscultation.    Heart:  Regular rate and rhythm. Abdomen:  Soft, nontender and nondistended. Normal bowel sounds, without guarding, and without rebound.   Neurologic:  Alert and  oriented x4;  grossly  normal neurologically.  Impression/Plan: Jill Bowers is here for an colonoscopy to be performed for positive PET, elevated CEA, h/o colon cancer  Risks, benefits, limitations, and alternatives regarding  colonoscopy have been reviewed with the patient.  Questions have been answered.  All parties agreeable.   Sherri Sear, MD  10/26/2018, 9:38 AM

## 2018-10-26 NOTE — Anesthesia Post-op Follow-up Note (Signed)
Anesthesia QCDR form completed.        

## 2018-10-26 NOTE — Anesthesia Postprocedure Evaluation (Signed)
Anesthesia Post Note  Patient: Jill Bowers  Procedure(s) Performed: COLONOSCOPY WITH PROPOFOL (N/A )  Patient location during evaluation: Endoscopy Anesthesia Type: General Level of consciousness: awake and alert Pain management: pain level controlled Vital Signs Assessment: post-procedure vital signs reviewed and stable Respiratory status: spontaneous breathing, nonlabored ventilation, respiratory function stable and patient connected to nasal cannula oxygen Cardiovascular status: blood pressure returned to baseline and stable Postop Assessment: no apparent nausea or vomiting Anesthetic complications: no     Last Vitals:  Vitals:   10/26/18 1016 10/26/18 1026  BP: (!) 147/67 (!) 132/52  Pulse: 61 69  Resp: 17 19  Temp:    SpO2: 99% 98%    Last Pain:  Vitals:   10/26/18 1016  TempSrc:   PainSc: 0-No pain                 Precious Haws [place]it[place]

## 2018-10-27 ENCOUNTER — Encounter: Payer: Self-pay | Admitting: Gastroenterology

## 2018-10-27 ENCOUNTER — Other Ambulatory Visit: Payer: Self-pay | Admitting: Pathology

## 2018-10-27 ENCOUNTER — Other Ambulatory Visit: Payer: Self-pay

## 2018-10-27 ENCOUNTER — Inpatient Hospital Stay: Payer: Medicare Other | Attending: Internal Medicine | Admitting: Internal Medicine

## 2018-10-27 ENCOUNTER — Inpatient Hospital Stay (HOSPITAL_BASED_OUTPATIENT_CLINIC_OR_DEPARTMENT_OTHER): Payer: Medicare Other | Admitting: Hospice and Palliative Medicine

## 2018-10-27 ENCOUNTER — Telehealth: Payer: Self-pay | Admitting: *Deleted

## 2018-10-27 DIAGNOSIS — Z515 Encounter for palliative care: Secondary | ICD-10-CM

## 2018-10-27 DIAGNOSIS — Z710 Person encountering health services to consult on behalf of another person: Secondary | ICD-10-CM

## 2018-10-27 DIAGNOSIS — I2510 Atherosclerotic heart disease of native coronary artery without angina pectoris: Secondary | ICD-10-CM

## 2018-10-27 DIAGNOSIS — C772 Secondary and unspecified malignant neoplasm of intra-abdominal lymph nodes: Secondary | ICD-10-CM

## 2018-10-27 DIAGNOSIS — C189 Malignant neoplasm of colon, unspecified: Secondary | ICD-10-CM

## 2018-10-27 LAB — SURGICAL PATHOLOGY

## 2018-10-27 MED ORDER — OXYCODONE HCL ER 10 MG PO T12A: 10.0000 mg | EXTENDED_RELEASE_TABLET | Freq: Two times a day (BID) | ORAL | 0 refills | Status: DC

## 2018-10-27 NOTE — Progress Notes (Signed)
Virtual Visit via Telephone Note  I connected with daughter for Jill Bowers on 10/27/18 at 10:00 AM EDT by telephone and verified that I am speaking with the correct person using two identifiers.   I discussed the limitations, risks, security and privacy concerns of performing an evaluation and management service by telephone and the availability of in person appointments. I also discussed with the patient that there may be a patient responsible charge related to this service. The patient expressed understanding and agreed to proceed.   History of Present Illness: [name] is an 85 year old lady with multiple medical problems including stage III colon cancer with multiple liver cysts seen on CT scan.  PMH is also notable for questionable dementia, history of CHF, DVT on Eliquis, history of PE, and renal insufficiency.  Patient underwent diagnostic colonoscopy on 10/26/2018 and was found to have a large partially obstructing colon mass.  Patient has been evaluated by medical oncology and is felt not to be a viable candidate for systemic treatment given her poor performance status and overall declining condition.  Palliative care was consulted to help address goals.   Observations/Objective: I called and spoke with patient's daughter Jill Bowers), with whom patient lives.  Patient has 7 living adult children with 3 children now deceased.  Daughter describes a general pattern of decline over the past few weeks associated with poor oral intake and progressive weakness.  Patient is ambulatory with assistance and use of a walker but only short distances.  Daughter has to assist patient with ADLs.  Patient has both Medicare and Medicaid and was receiving some type of home health aide but daughter stopped services as agency could not provide continuity with the caregiver.  Patient has had pain in the abdomen, which limits her ability to ambulate.  She has been taking Percocet 10-325mg  twice daily and  it has helped but is short-lived.  Will start OxyContin and continue Percocet for breakthrough pain.  Daughter verbalized understanding that patient would not be a good candidate for systemic treatment, nor would family be interested in that.  Daughter says her primary goal is comfort and quality of life.  We discussed symptom management including conservative management of possible future colonic obstruction.  Daughter would be interested in hospice services in the home.  Will coordinate a hospice referral.  We discussed advance care planning.  Jill Bowers is patient's POA but patient does not have a living will.  However, daughter says that she has discussed patient's wishes for end-of-life care prior to patient developing memory loss.  Daughter says that patient verbalized a desire not to have her life prolonged artificially and wanted to be a DO NOT RESUSCITATE.  She says the patient does have a signed DNR order in the home. . Case and plan discussed with Dr. Rogue Bussing, who also will call and speak with daughter today.  Assessment and Plan: -Hospice referral for in-home care -Best supportive care -Start OxyContin 10 mg every 12 hours (Rx #30) -Continue oxycodone 10-325 mg every 8 hours as needed for breakthrough pain -Prophylactic bowel regimen with daily MiraLAX -DNR    I discussed the assessment and treatment plan with the family. The patient's daughter was provided an opportunity to ask questions and all were answered. The patient agreed with the plan and demonstrated an understanding of the instructions.   The patient was advised to call back or seek an in-person evaluation if the symptoms worsen or if the condition fails to improve as anticipated.  I  provided 30 minutes of non-face-to-face time during this encounter.   Irean Hong, NP

## 2018-10-27 NOTE — Assessment & Plan Note (Addendum)
#  Right-sided colon cancer-stage III [2016-pT4N1a] with multiple liver cysts on the CT scan]. No adjuvant therapy [ given multiple comorbidities for performance status].  Significantly rising CEA-March 2020-480.  #Large sigmoid mass circumferential-noted on colonoscopy.  This is the most likely cause of patient's rising CEA.  Preliminary as per pathology-carcinoma in situ; deeper sections pending  #Had a long discussion the patient's daughter/health care power of attorney-who feels that patient is a poor candidate for surgery.  Patient had significant complications from prior surgery.  Given patient's dementia/episodes of delirium/family preference-/given her age and comorbidities I think is reasonable to hold off surgery.  Discussed regarding colonic stents-if patient has obstructive symptoms.  Family not interested.   #Right lower extremity DVT above the knee second ongoing malignancy-continue Eliquis/patient will need indefinite anticoagulation-unless contraindicated to bleeding or falls.  #Chronic abdominal pain secondary to scar tissue prior surgery.  Continue oxycodone agree with addition of long-acting OxyContin.Marland Kitchen  #Given the overall poor prognosis limited treatment options would recommend hospice.  Patient had a palliative care evaluation with [employer].  Discussed with [employer].  We will send a hospice referral.  #Debility/falls-discussed fall precautions.;  Interested in upgraded hospital bed for leg elevation comfort.  Defer to hospice.  #Follow-up as needed.   # # 40 minutes face-to-face with the patient discussing the above plan of care; more than 50% of time spent on prognosis/ natural history; counseling and coordination.

## 2018-10-27 NOTE — Progress Notes (Signed)
I connected with Jill Bowers on 10/30/18 at  9:30 AM EDT by video enabled telemedicine visit and verified that I am speaking with the correct person using two identifiers.  I discussed the limitations, risks, security and privacy concerns of performing an evaluation and management service by telemedicine and the availability of in-person appointments. I also discussed with the patient that there may be a patient responsible charge related to this service. The patient expressed understanding and agreed to proceed.    Other persons participating in the visit and their role in the encounter: Daughter-Jill Bowers/coordination of care. Patient's location: Home Provider's location: Office  Oncology History   # July 2016- COLON CANCER; mod diff adeno; STAGE III (pT4aN1a; MSI-STABLE) [s/p surgery;Dr. Arbutus Ped Simpson];No adjuvant chemo sec to co-morbdities DEC 2016- CT-C/ A/P [[facility]]- subcentimeter hepatic cysts ; irregular soft tissue density in the peritoneal fat within pelvis ? Scarring  [less likely recurrence];    # EGD/colonoscopy- AUG 2019- NEG s/p 2u PRBC.    # 10th  MARCH 2020- R LE DVT- start eliquis   # left thyroid nodule- No Bx  # endometrial thickening. Biopsy not done because of cervical stenosis [Dr.Schermerhorn].  # July R LE DVT/Bil PE s/p IVC filter; Hx of GIB; AKI; Hx of Severe Anemia; hx CHF   ----------------------------------------------------------------------   DIAGNOSIS: COLON CANCER  STAGE:  III       ;GOALS: curative  CURRENT/MOST RECENT THERAPY : surveillance      Colon cancer metastasized to intra-abdominal lymph node [facility])     Chief Complaint: Colon cancer   History of present illness:Jill Bowers 85 y.o.  female with history of history of stage III right-sided colon cancer status post hemicolectomy-recently underwent repeat colonoscopy for rising CEA.  Unfortunately in colonoscopy patient noted to have a sigmoid colon partially circumferential mass  suggestive of malignancy.   Patient continues to have chronic abdominal pain.  As per daughter patient continues to have issues with memory.   Patient had recent DVT currently on Eliquis.  Eliquis on hold for colonoscopy.  Patient is currently back on Eliquis post colonoscopy.  No blood in stools or black or stools  Observation/objective: Pathology pending  Assessment and plan: Colon cancer metastasized to intra-abdominal lymph node ([place]) #Right-sided colon cancer-stage III [2016-pT4N1a] with multiple liver cysts on the CT scan]. No adjuvant therapy [ given multiple comorbidities for performance status].  Significantly rising CEA-March 2020-480.  #Large sigmoid mass circumferential-noted on colonoscopy.  This is the most likely cause of patient's rising CEA.  Preliminary as per pathology-carcinoma in situ; deeper sections pending  #Had a long discussion the patient's daughter/health care power of attorney-who feels that patient is a poor candidate for surgery.  Patient had significant complications from prior surgery.  Given patient's dementia/episodes of delirium/family preference-/given her age and comorbidities I think is reasonable to hold off surgery.  Discussed regarding colonic stents-if patient has obstructive symptoms.  Family not interested.   #Right lower extremity DVT above the knee second ongoing malignancy-continue Eliquis/patient will need indefinite anticoagulation-unless contraindicated to bleeding or falls.  #Chronic abdominal pain secondary to scar tissue prior surgery.  Continue oxycodone agree with addition of long-acting OxyContin.Marland Kitchen  #Given the overall poor prognosis limited treatment options would recommend hospice.  Patient had a palliative care evaluation with [employer].  Discussed with [employer].  We will send a hospice referral.  #Debility/falls-discussed fall precautions.;  Interested in upgraded hospital bed for leg elevation comfort.  Defer to  hospice.  #Follow-up as needed.   # #  40 minutes face-to-face with the patient discussing the above plan of care; more than 50% of time spent on prognosis/ natural history; counseling and coordination.      Follow-up instructions:  I discussed the assessment and treatment plan with the patient.  The patient was provided an opportunity to ask questions and all were answered.  The patient agreed with the plan and demonstrated understanding of instructions.  The patient was advised to call back or seek an in person evaluation if the symptoms worsen or if the condition fails to improve as anticipated.  I provided 31mnutes of face-to-face video visit time during this encounter, and > 50% was spent counseling as documented under my assessment & plan.   Dr. GCharlaine DaltonCIvanhoeat [facility]5/09/2018 12:47 PM

## 2018-10-27 NOTE — Telephone Encounter (Signed)
Jill Bowers Key: [id number] - PA Case ID: [id number]    Pa initiated for oxyCODONE (OXYCONTIN) 10 mg 12 hr tablet

## 2018-10-27 NOTE — Progress Notes (Signed)
Contacted Jill Bowers- patient Daughter to initiate the telehealth visit. Daughter states that her mother had a sore throat s/p colonoscopy yesterday. No fevers. No nausea/vomiting.

## 2018-11-02 ENCOUNTER — Other Ambulatory Visit: Payer: Medicare Other

## 2018-11-02 NOTE — Progress Notes (Signed)
Tumor Board Documentation  Kelicia Tinner was presented by Dr Rogue Bussing at our Tumor Board on 11/02/2018, which included representatives from medical oncology, radiation oncology, surgical oncology, radiology, pathology, navigation, genetics, internal medicine, research, pulmonology.  Jill Bowers currently presents as a current patient, for new positive pathology, for discussion, for [place] with history of the following treatments: active survellience.  Additionally, we reviewed previous medical and familial history, history of present illness, and recent lab results along with all available histopathologic and imaging studies. The tumor board considered available treatment options and made the following recommendations:   Hospice referral  The following procedures/referrals were also placed: No orders of the defined types were placed in this encounter.   Clinical Trial Status: not discussed   Staging used: AJCC Stage Group  AJCC Staging: T: pT4a N: N1a   Group: Stage 3 Colon [place]   National site-specific guidelines NCCN were discussed with respect to the case.  Tumor board is a meeting of clinicians from various specialty areas who evaluate and discuss patients for whom a multidisciplinary approach is being considered. Final determinations in the plan of care are those of the provider(s). The responsibility for follow up of recommendations given during tumor board is that of the provider.   Today's extended care, comprehensive team conference, Damon was not present for the discussion and was not examined.   Multidisciplinary Tumor Board is a multidisciplinary case peer review process.  Decisions discussed in the Multidisciplinary Tumor Board reflect the opinions of the specialists present at the conference without having examined the patient.  Ultimately, treatment and diagnostic decisions rest with the primary provider(s) and the patient.

## 2018-11-13 ENCOUNTER — Ambulatory Visit (HOSPITAL_BASED_OUTPATIENT_CLINIC_OR_DEPARTMENT_OTHER): Payer: Medicare Other | Admitting: Hospice and Palliative Medicine

## 2018-11-13 ENCOUNTER — Telehealth: Payer: Self-pay | Admitting: *Deleted

## 2018-11-13 DIAGNOSIS — Z515 Encounter for palliative care: Secondary | ICD-10-CM

## 2018-11-13 DIAGNOSIS — G893 Neoplasm related pain (acute) (chronic): Secondary | ICD-10-CM | POA: Diagnosis not present

## 2018-11-13 MED ORDER — MORPHINE SULFATE ER 15 MG PO TBCR: 15.0000 mg | EXTENDED_RELEASE_TABLET | Freq: Three times a day (TID) | ORAL | 0 refills | Status: AC

## 2018-11-13 NOTE — Progress Notes (Signed)
Virtual Visit via Telephone Note  I connected with daughter for Jill Bowers on 11/13/18 at 10:30 AM EDT by telephone and verified that I am speaking with the correct person using two identifiers.   I discussed the limitations, risks, security and privacy concerns of performing an evaluation and management service by telephone and the availability of in person appointments. I also discussed with the patient that there may be a patient responsible charge related to this service. The patient expressed understanding and agreed to proceed.   History of Present Illness: [name] is an 85 year old lady with multiple medical problems including stage III colon cancer with multiple liver cysts seen on CT scan.  PMH is also notable for questionable dementia, history of CHF, DVT on Eliquis, history of PE, and renal insufficiency.  Patient underwent diagnostic colonoscopy on 10/26/2018 and was found to have a large partially obstructing colon mass.  Patient has been evaluated by medical oncology and is felt not to be a viable candidate for systemic treatment given her poor performance status and overall declining condition.  Palliative care was consulted to help address goals.   Observations/Objective: I called and spoke with patient's daughter Jill Bowers), with whom patient lives.  Patient has been admitted to hospice services at home.   She has had persistent pain unrelieved by use of Oxycontin and oxycodone. Daughter says that the OxyContin helps but wears off before the 12 hours. Additionally, hospice will not cover the cost of this medication.   Will rotate from OxyContin to MS Contin and trial Q8H dosing.   Daughter also reports that patient has had several days of bilateral LE edema and wheezing. I spoke with hospice nurse by phone and she will make a home visit today. I suspect that patient might benefit from increased Lasix to BID dosing for 1-2 days. She has an existing order to increase to  BID dosing if needed.   Daughter says she would be okay with cancelling the scheduled appointment with Dr. Rogue Bussing for next week.   Assessment and Plan: -Hospice/Supportive care -D/C OxyContin -MS Contin 15mg  Q8H -Continue Percocet 10-325mg  Q8H prn for BTP -Prophylactic bowel regimen -Will consider increasing Lasix 40mg  to BID x 1-2 days if appropriate after hospice visit -Consider adding nebs/albuterol if appropriate after hospice visit -Cancel scheduled clinic visit for next week -RTC as needed    I discussed the assessment and treatment plan with the family. The patient's daughter was provided an opportunity to ask questions and all were answered. The patient agreed with the plan and demonstrated an understanding of the instructions.   The patient was advised to call back or seek an in-person evaluation if the symptoms worsen or if the condition fails to improve as anticipated.  I provided 15 minutes of non-face-to-face time during this encounter.   Irean Hong, NP

## 2018-11-13 NOTE — Telephone Encounter (Signed)
Devana with hospice called asking for medicine change for patient pain. She is currently on Oxycontin 10 mg twice a day and it is not controlling her pain. She also asks that medicine be increased and changed to MS Contin as hospice does not pay for Oxycontin. Please advise

## 2018-11-13 NOTE — Telephone Encounter (Signed)
Josh- please feel free to adjust pt's pain medications to MS contin.  Also- please check with daughter- if they need to keep appt with me for next week? As pt is in hospice.   GB

## 2018-11-13 NOTE — Telephone Encounter (Signed)
I tried returning the call but did not reach Devana. Voicemail left. Would likely recommend switching to MS Contin with Q8H dosing but I would first like to speak with the hospice nurse.

## 2018-11-13 NOTE — Telephone Encounter (Signed)
apts cnl per np

## 2018-11-13 NOTE — Telephone Encounter (Signed)
I called and spoke with patient's daughter and the hospice nurse. Will rotate from OxyContin to MS Contin. Patient is also having increased bilat LE edema and wheezing. May need increased dose of Lasix to BID for 1-2 days (currently taking once daily). Existing Lasix order allows for BID dosing if needed. Hospice nurse is going to the home this afternoon and will call me for orders.   Daughter is okay with cancelling the appointment/labs next week.

## 2018-11-16 ENCOUNTER — Telehealth: Payer: Self-pay | Admitting: *Deleted

## 2018-11-16 NOTE — Telephone Encounter (Signed)
Called nurse back but did not reach her. VM left.

## 2018-11-16 NOTE — Telephone Encounter (Signed)
Hospice nurse called reporting increased lasix has not helped with edema and that patient is having pain and she needs to discuss with Josh. Please return her call [phone]

## 2018-11-17 ENCOUNTER — Emergency Department: Payer: Medicare Other

## 2018-11-17 ENCOUNTER — Other Ambulatory Visit: Payer: Self-pay

## 2018-11-17 ENCOUNTER — Emergency Department
Admission: EM | Admit: 2018-11-17 | Discharge: 2018-11-17 | Disposition: A | Discharge status: Home / Self Care | Payer: Medicare Other | Attending: Emergency Medicine | Admitting: Emergency Medicine

## 2018-11-17 ENCOUNTER — Encounter: Payer: Self-pay | Admitting: Emergency Medicine

## 2018-11-17 DIAGNOSIS — Z79899 Other long term (current) drug therapy: Secondary | ICD-10-CM | POA: Insufficient documentation

## 2018-11-17 DIAGNOSIS — R2243 Localized swelling, mass and lump, lower limb, bilateral: Secondary | ICD-10-CM | POA: Insufficient documentation

## 2018-11-17 DIAGNOSIS — I5032 Chronic diastolic (congestive) heart failure: Secondary | ICD-10-CM | POA: Insufficient documentation

## 2018-11-17 DIAGNOSIS — N183 Chronic kidney disease, stage 3 (moderate): Secondary | ICD-10-CM | POA: Diagnosis not present

## 2018-11-17 DIAGNOSIS — R600 Localized edema: Secondary | ICD-10-CM

## 2018-11-17 DIAGNOSIS — I130 Hypertensive heart and chronic kidney disease with heart failure and stage 1 through stage 4 chronic kidney disease, or unspecified chronic kidney disease: Secondary | ICD-10-CM | POA: Diagnosis not present

## 2018-11-17 DIAGNOSIS — I2510 Atherosclerotic heart disease of native coronary artery without angina pectoris: Secondary | ICD-10-CM | POA: Diagnosis not present

## 2018-11-17 LAB — COMPREHENSIVE METABOLIC PANEL
ALT: 7 U/L (ref 0–44)
AST: 12 U/L — ABNORMAL LOW (ref 15–41)
Albumin: 3.8 g/dL (ref 3.5–5.0)
Alkaline Phosphatase: 78 U/L (ref 38–126)
Anion gap: 9 (ref 5–15)
BUN: 20 mg/dL (ref 8–23)
CO2: 30 mmol/L (ref 22–32)
Calcium: 9.2 mg/dL (ref 8.9–10.3)
Chloride: 98 mmol/L (ref 98–111)
Creatinine, Ser: 1.28 mg/dL — ABNORMAL HIGH (ref 0.44–1.00)
GFR calc Af Amer: 44 mL/min — ABNORMAL LOW (ref >60)
GFR calc non Af Amer: 38 mL/min — ABNORMAL LOW (ref >60)
Glucose, Bld: 116 mg/dL — ABNORMAL HIGH (ref 70–99)
Potassium: 4 mmol/L (ref 3.5–5.1)
Sodium: 137 mmol/L (ref 135–145)
Total Bilirubin: 0.6 mg/dL (ref 0.3–1.2)
Total Protein: 7 g/dL (ref 6.5–8.1)

## 2018-11-17 LAB — CBC
HCT: 37.2 % (ref 36.0–46.0)
Hemoglobin: 11.7 g/dL — ABNORMAL LOW (ref 12.0–15.0)
MCH: 28.1 pg (ref 26.0–34.0)
MCHC: 31.5 g/dL (ref 30.0–36.0)
MCV: 89.4 fL (ref 80.0–100.0)
Platelets: 238 10*3/uL (ref 150–400)
RBC: 4.16 MIL/uL (ref 3.87–5.11)
RDW: 14.5 % (ref 11.5–15.5)
WBC: 5.6 10*3/uL (ref 4.0–10.5)
nRBC: 0 % (ref 0.0–0.2)

## 2018-11-17 LAB — BRAIN NATRIURETIC PEPTIDE: B Natriuretic Peptide: 108 pg/mL — ABNORMAL HIGH (ref 0.0–100.0)

## 2018-11-17 NOTE — ED Provider Notes (Signed)
[facility] Emergency Department Provider Note   ____________________________________________    I have reviewed the triage vital signs and the nursing notes.   HISTORY  Chief Complaint Leg Swelling     HPI Juel Uliano is a 85 y.o. female with a history of colon cancer who is on home hospice who presents today with bilateral lower extremity swelling, no significant shortness of breath.  Apparently patient has had swelling for about a week now has been put on diuretics with little improvement.  Apparently today the home hospice nurse recommended ED evaluation because of possible crackles on lung exam.  No fevers or chills.  Patient has no complaints  Past Medical History:  Diagnosis Date  . Cancer associated pain   . CHF (congestive heart failure) ([place])   . Colon cancer ([place])   . Complication of anesthesia    difficulty waking up afcter colon surgery  . Coronary artery disease   . DVT (deep venous thrombosis) ([place])   . GERD (gastroesophageal reflux disease)   . Hypertension   . Malignant neoplasm of stomach ([place])   . PE (pulmonary embolism)   . Renal disorder   . Renal insufficiency     Patient Active Problem List   Diagnosis Date Noted  . Mass of colon   . Right leg swelling 09/05/2018  . Abdominal pain, generalized   . Thickened endometrium 03/10/2018  . MCI (mild cognitive impairment) 02/24/2018  . CKD (chronic kidney disease) stage 3, GFR 30-59 ml/min (HCC) 02/14/2018  . CAD (coronary artery disease) 02/14/2018  . Polyp of stomach and duodenum   . Osteoarthritis 08/09/2017  . Chronic diastolic congestive heart failure ([place]) 01/27/2017  . Essential hypertension 06/12/2015  . History of DVT 06/12/2015  . History of pulmonary embolus (PE) 06/12/2015  . GERD (gastroesophageal reflux disease) 06/12/2015  . Colon cancer metastasized to intra-abdominal lymph node ([place]) 06/02/2015    Past Surgical History:  Procedure Laterality Date  .  ABDOMINAL HYSTERECTOMY    . COLON SURGERY    . COLONOSCOPY N/A 02/01/2018   Procedure: COLONOSCOPY;  Surgeon: Virgel Manifold, MD;  Location: [facility] ENDOSCOPY;  Service: Endoscopy;  Laterality: N/A;  . COLONOSCOPY WITH PROPOFOL N/A 08/23/2016   Procedure: COLONOSCOPY WITH PROPOFOL;  Surgeon: Jonathon Bellows, MD;  Location: [facility] ENDOSCOPY;  Service: Gastroenterology;  Laterality: N/A;  . COLONOSCOPY WITH PROPOFOL N/A 08/24/2016   Procedure: COLONOSCOPY WITH PROPOFOL;  Surgeon: Jonathon Bellows, MD;  Location: [facility] ENDOSCOPY;  Service: Endoscopy;  Laterality: N/A;  . COLONOSCOPY WITH PROPOFOL N/A 10/26/2018   Procedure: COLONOSCOPY WITH PROPOFOL;  Surgeon: Lin Landsman, MD;  Location: [facility] ENDOSCOPY;  Service: Gastroenterology;  Laterality: N/A;  . ESOPHAGOGASTRODUODENOSCOPY N/A 02/01/2018   Procedure: ESOPHAGOGASTRODUODENOSCOPY (EGD);  Surgeon: Virgel Manifold, MD;  [facility];  Service: Endoscopy;  Laterality: N/A;  . ESOPHAGOGASTRODUODENOSCOPY (EGD) WITH PROPOFOL N/A 03/29/2018   Procedure: ESOPHAGOGASTRODUODENOSCOPY (EGD) WITH PROPOFOL;  Surgeon: Virgel Manifold, MD;  Location: [facility] ENDOSCOPY;  Service: Endoscopy;  Laterality: N/A;  . IVC FILTER PLACEMENT ([place])    . JOINT REPLACEMENT Right 2000   knee    Prior to Admission medications   Medication Sig Start Date End Date Taking? Authorizing Provider  acetaminophen (TYLENOL) 650 MG CR tablet Take 650 mg by mouth every 8 (eight) hours as needed for pain.    [provider]  apixaban (ELIQUIS) 5 MG TABS tablet Take 1 tablet (5 mg total) by mouth 2 (two) times daily. 09/05/18  Cammie Sickle, MD  Cholecalciferol (VITAMIN D-3) 1000 UNITS CAPS Take 2 capsules by mouth daily.    [provider]  COMBIGAN 0.2-0.5 % ophthalmic solution Place 1 drop into both eyes 2 (two) times daily. 11/30/17   [provider]  furosemide (LASIX) 40 MG tablet TAKE 1 TABLET (40 MG TOTAL) BY MOUTH 2 (TWO) TIMES DAILY  AS NEEDED. 10/13/18   Minna Merritts, MD  Magnesium Oxide 500 MG TABS Take 1 tablet by mouth 2 (two) times daily.    [provider]  MEGARED OMEGA-3 KRILL OIL 500 MG CAPS Take by mouth.    [provider]  morphine (MS CONTIN) 15 MG 12 hr tablet Take 1 tablet (15 mg total) by mouth every 8 (eight) hours. 11/13/18   Borders, Kirt Boys, NP  omeprazole (PRILOSEC) 20 MG capsule Take 20 mg by mouth daily.    [provider]  oxyCODONE-acetaminophen (PERCOCET) 10-325 MG tablet Take 1 tablet by mouth every 8 (eight) hours as needed for pain. 10/17/18   Jearld Fenton, NP  Polyethylene Glycol 3350 (MIRALAX PO) Take by mouth daily.    [provider]  spironolactone (ALDACTONE) 25 MG tablet TAKE 1 TABLET (25 MG TOTAL) BY MOUTH 2 (TWO) TIMES DAILY AS NEEDED. 03/27/18   Gollan, Kathlene November, MD  vitamin C (ASCORBIC ACID) 500 MG tablet Take 500 mg by mouth daily.    [provider]     Allergies Gabapentin; Reglan [metoclopramide]; Statins; and Torsemide  Family History  Problem Relation Age of Onset  . Arthritis Mother   . Hypertension Mother   . Colon cancer Sister   . Heart disease Daughter   . Colon cancer Son   . Pancreatic cancer Son     Social History Social History   Tobacco Use  . Smoking status: Never Smoker  . Smokeless tobacco: Never Used  Substance Use Topics  . Alcohol use: No  . Drug use: No    Review of Systems  Constitutional: No fever/chills Eyes: No visual changes.  ENT: No sore throat. Cardiovascular: Denies chest pain. Respiratory: As above Gastrointestinal: No abdominal pain.  No nausea, no vomiting.   Genitourinary: Negative for dysuria. Musculoskeletal: Negative for back pain. Skin: Negative for rash. Neurological: Negative for headaches   ____________________________________________   PHYSICAL EXAM:  VITAL SIGNS: ED Triage Vitals  Enc Vitals Group     BP 11/17/18 1231 (!) 134/52     Pulse Rate 11/17/18  1231 79     Resp 11/17/18 1231 18     Temp 11/17/18 1231 99.2 F (37.3 C)     Temp Source 11/17/18 1231 Oral     SpO2 11/17/18 1231 92 %     Weight 11/17/18 1226 92.1 kg (203 lb)     Height 11/17/18 1226 1.6 m (5\' 3" )     Head Circumference --      Peak Flow --      Pain Score 11/17/18 1225 8     Pain Loc --      Pain Edu? --      Excl. in [place]? --     Constitutional: Alert   Eyes: Conjunctivae are normal.  Head: Atraumatic. Nose: No congestion/rhinnorhea. Mouth/Throat: Mucous membranes are moist.    Cardiovascular: Normal rate, regular rhythm. Grossly normal heart sounds.  Good peripheral circulation. Respiratory: Normal respiratory effort.  No retractions. Lungs CTAB. Gastrointestinal: Soft and nontender. No distention.  No CVA tenderness. Musculoskeletal: Bilateral lower extremity edema, moderate Neurologic:  No gross focal neurologic deficits are appreciated.  Skin:  Skin is warm, dry and intact. No rash noted. Psychiatric: Mood and affect are normal. Speech and behavior are normal.  ____________________________________________   LABS (all labs ordered are listed, but only abnormal results are displayed)  Labs Reviewed  CBC - Abnormal; Notable for the following components:      Result Value   Hemoglobin 11.7 (*)    All other components within normal limits  BRAIN NATRIURETIC PEPTIDE - Abnormal; Notable for the following components:   B Natriuretic Peptide 108.0 (*)    All other components within normal limits  COMPREHENSIVE METABOLIC PANEL - Abnormal; Notable for the following components:   Glucose, Bld 116 (*)    Creatinine, Ser 1.28 (*)    AST 12 (*)    GFR calc non Af Amer 38 (*)    GFR calc Af Amer 44 (*)    All other components within normal limits   ____________________________________________  EKG  ED ECG REPORT I, Lavonia Drafts, the attending physician, personally viewed and interpreted this ECG.  Date: 11/17/2018  Rhythm: normal sinus rhythm QRS  Axis: normal Intervals: normal ST/T Wave abnormalities: normal Narrative Interpretation: no evidence of acute ischemia  ____________________________________________  RADIOLOGY  Chest x-ray reviewed by me, unremarkable ____________________________________________   PROCEDURES  Procedure(s) performed: No  Procedures   Critical Care performed: No ____________________________________________   INITIAL IMPRESSION / ASSESSMENT AND PLAN / ED COURSE  Pertinent labs & imaging results that were available during my care of the patient were reviewed by me and considered in my medical decision making (see chart for details).  Patient presents with lower extremity edema, this does not appear to be affecting her quality of life significantly, she is setting 92 to 95% on room air but is not tachypneic or uncomfortable.  Given that she is on home hospice is not entirely clear to me why she was sent to the emergency department.  [facility] hospice awaiting callback from case manager  Chest x-ray unremarkable, no edema.  Patient satting 95 to 97% on re-eval.  Discussed with daughter who agrees with discharge outpatient follow-up continue p.o. diuretics    ____________________________________________   FINAL CLINICAL IMPRESSION(S) / ED DIAGNOSES  Final diagnoses:  Leg edema        Note:  This document was prepared using Dragon voice recognition software and may include unintentional dictation errors.   Lavonia Drafts, MD 11/17/18 1530

## 2018-11-17 NOTE — ED Triage Notes (Signed)
Pt daughter reports pt's hospice RN advised them to come to the ED for lasix due to LE edema since Monday. Daughter reports she takes 80mg  lasix but it is not helping.

## 2018-11-17 NOTE — Care Management (Signed)
Patient is currently followed by [facility]. Care team asked to notify agency of disposition. At present time, it is anticipated that patient will be diuresed then discharged home.

## 2018-11-17 NOTE — ED Notes (Signed)
MD and RN at bedside

## 2018-11-24 ENCOUNTER — Ambulatory Visit: Payer: Medicare Other | Admitting: Internal Medicine

## 2018-11-24 ENCOUNTER — Other Ambulatory Visit: Payer: Medicare Other

## 2018-12-14 ENCOUNTER — Ambulatory Visit: Payer: Medicare Other | Admitting: Gastroenterology

## 2018-12-27 DEATH — deceased

## 2019-01-09 ENCOUNTER — Other Ambulatory Visit: Payer: Self-pay | Admitting: Cardiovascular Disease

## 2019-08-10 IMAGING — CT NM PET TUM IMG RESTAG (PS) SKULL BASE T - THIGH
1 of 9 series · 1 of 25 positions shown · non-contrast
Comparison: PET-CT dated 02/08/2017

CLINICAL DATA: Subsequent treatment strategy for colon cancer.

EXAM:
NUCLEAR MEDICINE PET SKULL BASE TO THIGH
TECHNIQUE: 12.5 mCi F-18 FDG was injected intravenously. Full-ring PET imaging
was performed from the skull base to thigh after the radiotracer. CT
data was obtained and used for attenuation correction and anatomic
localization.
FASTING BLOOD GLUCOSE:  Value: 110 mg/dl

[Series 3: ct wb 5.0 b30f · axial · 5.0mm · 0.98mm/px · 1 of 290 slices shown]
[im 290/290  brain]
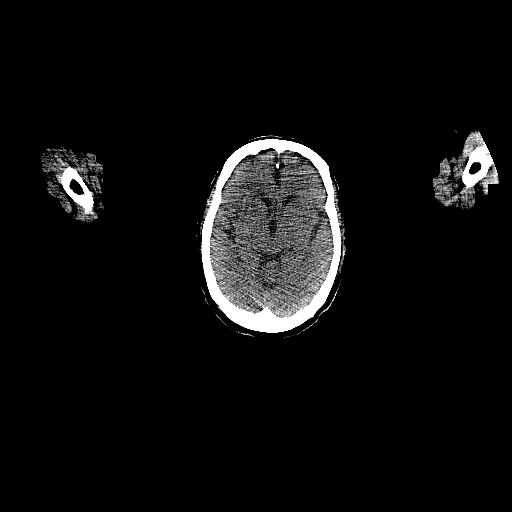

[1 of 25 positions shown; findings below may reference images not displayed]

FINDINGS: NECK: No hypermetabolic cervical lymphadenopathy.

13 mm left thyroid nodule with mild hypermetabolism, max SUV 6.0,
grossly unchanged.

CHEST: No hypermetabolic pulmonary nodules. Scarring/atelectasis in
the posterior right lower lobe. Eventration of the right
hemidiaphragm.

No hypermetabolic thoracic lymphadenopathy.

Cardiomegaly. Trace pericardial fluid. No evidence of thoracic
aortic aneurysm. Atherosclerotic calcifications of the aortic arch.
Coronary atherosclerosis of the LAD.

ABDOMEN/PELVIS: Status post partial right hemicolectomy with suture
line in the right upper abdomen (series 3/image 122).

No abnormal hypermetabolism in the liver, spleen, pancreas, or
adrenal glands. Scattered right hepatic cysts, grossly unchanged.

No hypermetabolic lymphadenopathy in the abdomen/pelvis.

Atherosclerotic calcifications the abdominal aorta and branch
vessels. IVC filter. Sigmoid diverticulosis, without evidence of
diverticulitis. Calcified uterine fibroids. Fluid within the
endometrial fundus, suggesting cervical stenosis. Small fat
containing bilateral inguinal hernias, left greater than right.

SKELETON: No focal hypermetabolic activity to suggest skeletal
metastasis.
IMPRESSION: No findings suspicious for recurrent or metastatic disease.

Status post right hemicolectomy.

13 mm left thyroid nodule mild hypermetabolism, grossly unchanged.

Chronic endometrial fluid in the uterine fundus, suggesting cervical
stenosis.

Additional stable ancillary findings as above.

## 2019-09-27 NOTE — Telephone Encounter (Signed)
x
